# Patient Record
Sex: Male | Born: 1945 | Race: Black or African American | Hispanic: No | Smoking: Current every day smoker
Health system: Southern US, Community
[De-identification: ages and names within clinical notes are randomized; demographics above are authoritative.]

## PROBLEM LIST (undated history)

## (undated) DIAGNOSIS — E119 Type 2 diabetes mellitus without complications: Secondary | ICD-10-CM

## (undated) DIAGNOSIS — M1990 Unspecified osteoarthritis, unspecified site: Secondary | ICD-10-CM

## (undated) DIAGNOSIS — C801 Malignant (primary) neoplasm, unspecified: Secondary | ICD-10-CM

## (undated) DIAGNOSIS — I10 Essential (primary) hypertension: Secondary | ICD-10-CM

## (undated) DIAGNOSIS — J449 Chronic obstructive pulmonary disease, unspecified: Secondary | ICD-10-CM

## (undated) DIAGNOSIS — Z720 Tobacco use: Secondary | ICD-10-CM

## (undated) DIAGNOSIS — Z972 Presence of dental prosthetic device (complete) (partial): Secondary | ICD-10-CM

## (undated) DIAGNOSIS — R972 Elevated prostate specific antigen [PSA]: Secondary | ICD-10-CM

## (undated) DIAGNOSIS — G709 Myoneural disorder, unspecified: Secondary | ICD-10-CM

## (undated) HISTORY — PX: CERVICAL DISCECTOMY: SHX98

## (undated) HISTORY — PX: TUNNELED VENOUS PORT PLACEMENT: SHX819

## (undated) HISTORY — PX: CERVICAL FUSION: SHX112

## (undated) HISTORY — PX: LAMINECTOMY: SHX219

## (undated) HISTORY — PX: OTHER SURGICAL HISTORY: SHX169

## (undated) HISTORY — PX: COLOSTOMY REVISION: SHX5232

---

## 2005-10-29 ENCOUNTER — Emergency Department: Payer: Self-pay | Admitting: Emergency Medicine

## 2010-11-11 DIAGNOSIS — Z85038 Personal history of other malignant neoplasm of large intestine: Secondary | ICD-10-CM | POA: Insufficient documentation

## 2010-11-11 HISTORY — PX: COLON SURGERY: SHX602

## 2010-11-25 ENCOUNTER — Ambulatory Visit: Payer: Self-pay

## 2010-11-25 ENCOUNTER — Inpatient Hospital Stay: Payer: Self-pay | Admitting: Surgery

## 2010-12-03 LAB — PATHOLOGY REPORT

## 2011-01-10 ENCOUNTER — Encounter: Payer: Self-pay | Admitting: Nurse Practitioner

## 2011-01-10 ENCOUNTER — Encounter: Payer: Self-pay | Admitting: Cardiothoracic Surgery

## 2011-01-10 ENCOUNTER — Ambulatory Visit: Payer: Self-pay | Admitting: Internal Medicine

## 2011-01-11 ENCOUNTER — Encounter: Payer: Self-pay | Admitting: Nurse Practitioner

## 2011-01-11 ENCOUNTER — Encounter: Payer: Self-pay | Admitting: Cardiothoracic Surgery

## 2011-01-12 ENCOUNTER — Ambulatory Visit: Payer: Self-pay | Admitting: Internal Medicine

## 2011-01-12 LAB — CBC CANCER CENTER
Basophil #: 0 x10 3/mm (ref 0.0–0.1)
Eosinophil #: 0.2 x10 3/mm (ref 0.0–0.7)
Eosinophil: 1 %
Lymphocyte #: 2 x10 3/mm (ref 1.0–3.6)
Lymphocyte %: 33 %
MCHC: 29.8 g/dL — ABNORMAL LOW (ref 32.0–36.0)
Monocyte %: 10.2 %
Monocytes: 6 %
Neutrophil %: 53 %
Platelet: 315 x10 3/mm (ref 150–440)
RDW: 29.1 % — ABNORMAL HIGH (ref 11.5–14.5)
Segmented Neutrophils: 63 %

## 2011-01-12 LAB — COMPREHENSIVE METABOLIC PANEL
Albumin: 3.7 g/dL (ref 3.4–5.0)
Alkaline Phosphatase: 80 U/L (ref 50–136)
BUN: 17 mg/dL (ref 7–18)
Bilirubin,Total: 0.3 mg/dL (ref 0.2–1.0)
Calcium, Total: 9.3 mg/dL (ref 8.5–10.1)
Chloride: 106 mmol/L (ref 98–107)
Creatinine: 0.91 mg/dL (ref 0.60–1.30)
EGFR (African American): >60
EGFR (Non-African Amer.): >60
Glucose: 94 mg/dL (ref 65–99)
Osmolality: 286 (ref 275–301)
SGPT (ALT): 23 U/L
Sodium: 143 mmol/L (ref 136–145)

## 2011-01-17 ENCOUNTER — Ambulatory Visit: Payer: Self-pay | Admitting: Internal Medicine

## 2011-01-24 ENCOUNTER — Ambulatory Visit: Payer: Self-pay | Admitting: Surgery

## 2011-01-25 LAB — CBC CANCER CENTER
Basophil #: 0 x10 3/mm (ref 0.0–0.1)
Eosinophil #: 0.2 x10 3/mm (ref 0.0–0.7)
HCT: 31.2 % — ABNORMAL LOW (ref 40.0–52.0)
MCH: 21.7 pg — ABNORMAL LOW (ref 26.0–34.0)
MCHC: 30.1 g/dL — ABNORMAL LOW (ref 32.0–36.0)
Monocyte #: 0.6 x10 3/mm (ref 0.0–0.7)
Neutrophil %: 50.9 %
Platelet: 316 x10 3/mm (ref 150–440)
RDW: 30.3 % — ABNORMAL HIGH (ref 11.5–14.5)

## 2011-01-25 LAB — COMPREHENSIVE METABOLIC PANEL
Anion Gap: 9 (ref 7–16)
Calcium, Total: 8.6 mg/dL (ref 8.5–10.1)
Chloride: 106 mmol/L (ref 98–107)
Co2: 28 mmol/L (ref 21–32)
EGFR (African American): >60
EGFR (Non-African Amer.): >60
Osmolality: 285 (ref 275–301)
Potassium: 4.1 mmol/L (ref 3.5–5.1)
Sodium: 143 mmol/L (ref 136–145)

## 2011-02-11 ENCOUNTER — Ambulatory Visit: Payer: Self-pay | Admitting: Internal Medicine

## 2011-02-11 ENCOUNTER — Encounter: Payer: Self-pay | Admitting: Cardiothoracic Surgery

## 2011-02-11 ENCOUNTER — Encounter: Payer: Self-pay | Admitting: Nurse Practitioner

## 2011-02-11 LAB — BASIC METABOLIC PANEL
BUN: 19 mg/dL — ABNORMAL HIGH (ref 7–18)
Chloride: 105 mmol/L (ref 98–107)
Creatinine: 1.03 mg/dL (ref 0.60–1.30)
EGFR (African American): >60
EGFR (Non-African Amer.): >60
Glucose: 129 mg/dL — ABNORMAL HIGH (ref 65–99)
Potassium: 4 mmol/L (ref 3.5–5.1)
Sodium: 141 mmol/L (ref 136–145)

## 2011-02-11 LAB — CBC CANCER CENTER
Basophil #: 0 x10 3/mm (ref 0.0–0.1)
Basophil %: 0.4 %
Eosinophil #: 0.2 x10 3/mm (ref 0.0–0.7)
HCT: 37.3 % — ABNORMAL LOW (ref 40.0–52.0)
Lymphocyte #: 1.6 x10 3/mm (ref 1.0–3.6)
Lymphocyte %: 42.1 %
MCHC: 30.4 g/dL — ABNORMAL LOW (ref 32.0–36.0)
MCV: 77.4 fL — ABNORMAL LOW (ref 80–100)
Monocyte %: 14 %
Neutrophil #: 1.4 x10 3/mm (ref 1.4–6.5)
Neutrophil %: 38.8 %
Platelet: 200 x10 3/mm (ref 150–440)

## 2011-02-25 LAB — CBC CANCER CENTER
Basophil #: 0 x10 3/mm (ref 0.0–0.1)
Basophil %: 0.4 %
Eosinophil #: 0.1 x10 3/mm (ref 0.0–0.7)
Eosinophil %: 1.7 %
HCT: 41.8 % (ref 40.0–52.0)
HGB: 12.8 g/dL — ABNORMAL LOW (ref 13.0–18.0)
Lymphocyte %: 42.5 %
Lymphs Abs: 1.7 x10 3/mm (ref 1.0–3.6)
MCH: 23.9 pg — ABNORMAL LOW (ref 26.0–34.0)
MCHC: 30.5 g/dL — ABNORMAL LOW (ref 32.0–36.0)
MCV: 78.2 fL — ABNORMAL LOW (ref 80–100)
Monocyte #: 0.6 x10 3/mm (ref 0.0–0.7)
Monocyte %: 14.7 %
Neutrophil #: 1.6 x10 3/mm (ref 1.4–6.5)
Neutrophil %: 40.7 %
Platelet: 157 x10 3/mm (ref 150–440)
RBC: 5.35 x10 6/mm (ref 4.40–5.90)
RDW: 27.6 % — ABNORMAL HIGH (ref 11.5–14.5)
WBC: 4 x10 3/mm (ref 3.8–10.6)

## 2011-02-25 LAB — BASIC METABOLIC PANEL
Anion Gap: 6 — ABNORMAL LOW (ref 7–16)
BUN: 23 mg/dL — ABNORMAL HIGH (ref 7–18)
Calcium, Total: 8.8 mg/dL (ref 8.5–10.1)
Co2: 29 mmol/L (ref 21–32)
Creatinine: 1.17 mg/dL (ref 0.60–1.30)
EGFR (African American): >60
EGFR (Non-African Amer.): >60
Glucose: 93 mg/dL (ref 65–99)
Osmolality: 281 (ref 275–301)
Potassium: 4.2 mmol/L (ref 3.5–5.1)

## 2011-03-11 ENCOUNTER — Ambulatory Visit: Payer: Self-pay | Admitting: Internal Medicine

## 2011-03-11 LAB — CBC CANCER CENTER
Basophil %: 0.2 %
Eosinophil #: 0.1 x10 3/mm (ref 0.0–0.7)
HCT: 41.2 % (ref 40.0–52.0)
HGB: 13.1 g/dL (ref 13.0–18.0)
MCH: 24.9 pg — ABNORMAL LOW (ref 26.0–34.0)
MCHC: 31.8 g/dL — ABNORMAL LOW (ref 32.0–36.0)
Monocyte #: 0.6 x10 3/mm (ref 0.0–0.7)
Neutrophil #: 1.4 x10 3/mm (ref 1.4–6.5)
Neutrophil %: 39.8 %
Platelet: 165 x10 3/mm (ref 150–440)

## 2011-03-11 LAB — BASIC METABOLIC PANEL
Anion Gap: 10 (ref 7–16)
BUN: 21 mg/dL — ABNORMAL HIGH (ref 7–18)
Calcium, Total: 8.1 mg/dL — ABNORMAL LOW (ref 8.5–10.1)
EGFR (Non-African Amer.): >60
Glucose: 119 mg/dL — ABNORMAL HIGH (ref 65–99)
Osmolality: 282 (ref 275–301)
Potassium: 4.1 mmol/L (ref 3.5–5.1)

## 2011-03-25 LAB — BASIC METABOLIC PANEL
BUN: 19 mg/dL — ABNORMAL HIGH (ref 7–18)
Co2: 26 mmol/L (ref 21–32)
Creatinine: 1.19 mg/dL (ref 0.60–1.30)
EGFR (African American): >60
EGFR (Non-African Amer.): >60
Glucose: 116 mg/dL — ABNORMAL HIGH (ref 65–99)
Potassium: 4.3 mmol/L (ref 3.5–5.1)
Sodium: 137 mmol/L (ref 136–145)

## 2011-03-25 LAB — CBC CANCER CENTER
Basophil #: 0 x10 3/mm (ref 0.0–0.1)
Lymphocyte #: 1.7 x10 3/mm (ref 1.0–3.6)
MCV: 78.6 fL — ABNORMAL LOW (ref 80–100)
Monocyte %: 16.6 %
Platelet: 135 x10 3/mm — ABNORMAL LOW (ref 150–440)
RDW: 26.6 % — ABNORMAL HIGH (ref 11.5–14.5)
WBC: 3.7 x10 3/mm — ABNORMAL LOW (ref 3.8–10.6)

## 2011-04-11 ENCOUNTER — Ambulatory Visit: Payer: Self-pay | Admitting: Internal Medicine

## 2011-04-15 LAB — CBC CANCER CENTER
Basophil #: 0 x10 3/mm (ref 0.0–0.1)
Eosinophil %: 2 %
HGB: 14.4 g/dL (ref 13.0–18.0)
Lymphocyte #: 2 x10 3/mm (ref 1.0–3.6)
Lymphocyte %: 51.8 %
MCH: 26.1 pg (ref 26.0–34.0)
MCHC: 32.1 g/dL (ref 32.0–36.0)
MCV: 81.2 fL (ref 80–100)
Monocyte #: 0.7 x10 3/mm (ref 0.0–0.7)
Monocyte %: 17.5 %
Neutrophil #: 1.1 x10 3/mm — ABNORMAL LOW (ref 1.4–6.5)
Platelet: 168 x10 3/mm (ref 150–440)
RDW: 27.3 % — ABNORMAL HIGH (ref 11.5–14.5)
WBC: 3.9 x10 3/mm (ref 3.8–10.6)

## 2011-04-15 LAB — BASIC METABOLIC PANEL
Calcium, Total: 8.8 mg/dL (ref 8.5–10.1)
Co2: 26 mmol/L (ref 21–32)
Creatinine: 1.22 mg/dL (ref 0.60–1.30)
EGFR (African American): >60
EGFR (Non-African Amer.): >60
Osmolality: 277 (ref 275–301)
Sodium: 136 mmol/L (ref 136–145)

## 2011-04-29 LAB — BASIC METABOLIC PANEL
Anion Gap: 8 (ref 7–16)
BUN: 17 mg/dL (ref 7–18)
Calcium, Total: 8.7 mg/dL (ref 8.5–10.1)
EGFR (African American): >60
Glucose: 93 mg/dL (ref 65–99)
Osmolality: 264 (ref 275–301)
Potassium: 3.8 mmol/L (ref 3.5–5.1)

## 2011-04-29 LAB — CBC CANCER CENTER
Eosinophil #: 0.1 x10 3/mm (ref 0.0–0.7)
Eosinophil %: 1.6 %
HCT: 41.9 % (ref 40.0–52.0)
Lymphocyte #: 1.9 x10 3/mm (ref 1.0–3.6)
Lymphocyte %: 47.9 %
MCHC: 32.6 g/dL (ref 32.0–36.0)
MCV: 84 fL (ref 80–100)
Monocyte #: 0.8 x10 3/mm (ref 0.2–1.0)
Neutrophil #: 1.2 x10 3/mm — ABNORMAL LOW (ref 1.4–6.5)
Platelet: 140 x10 3/mm — ABNORMAL LOW (ref 150–440)
RDW: 26.5 % — ABNORMAL HIGH (ref 11.5–14.5)

## 2011-05-11 ENCOUNTER — Ambulatory Visit: Payer: Self-pay | Admitting: Internal Medicine

## 2011-05-13 LAB — CBC CANCER CENTER
Basophil #: 0.1 x10 3/mm (ref 0.0–0.1)
HGB: 14.6 g/dL (ref 13.0–18.0)
Lymphocyte %: 41.8 %
MCHC: 32.7 g/dL (ref 32.0–36.0)
MCV: 86 fL (ref 80–100)
Monocyte #: 1 x10 3/mm (ref 0.2–1.0)
Monocyte %: 23.1 %
Neutrophil #: 1.4 x10 3/mm (ref 1.4–6.5)
Neutrophil %: 32.5 %
Platelet: 101 x10 3/mm — ABNORMAL LOW (ref 150–440)
WBC: 4.4 x10 3/mm (ref 3.8–10.6)

## 2011-05-13 LAB — BASIC METABOLIC PANEL
Anion Gap: 9 (ref 7–16)
BUN: 20 mg/dL — ABNORMAL HIGH (ref 7–18)
Calcium, Total: 9 mg/dL (ref 8.5–10.1)
Creatinine: 1.39 mg/dL — ABNORMAL HIGH (ref 0.60–1.30)
EGFR (African American): >60
EGFR (Non-African Amer.): 53 — ABNORMAL LOW
Glucose: 113 mg/dL — ABNORMAL HIGH (ref 65–99)
Sodium: 136 mmol/L (ref 136–145)

## 2011-05-27 LAB — CBC CANCER CENTER
Basophil %: 1.2 %
Eosinophil #: 0.1 x10 3/mm (ref 0.0–0.7)
HGB: 14.5 g/dL (ref 13.0–18.0)
Lymphocyte #: 1.9 x10 3/mm (ref 1.0–3.6)
Lymphocyte %: 44.6 %
MCHC: 32.9 g/dL (ref 32.0–36.0)
Monocyte #: 0.9 x10 3/mm (ref 0.2–1.0)
Monocyte %: 20.7 %
Neutrophil #: 1.4 x10 3/mm (ref 1.4–6.5)
Neutrophil %: 32.2 %
Platelet: 106 x10 3/mm — ABNORMAL LOW (ref 150–440)
RBC: 5.01 10*6/uL (ref 4.40–5.90)
RDW: 24.1 % — ABNORMAL HIGH (ref 11.5–14.5)
WBC: 4.2 x10 3/mm (ref 3.8–10.6)

## 2011-05-27 LAB — COMPREHENSIVE METABOLIC PANEL
Albumin: 3.3 g/dL — ABNORMAL LOW (ref 3.4–5.0)
Alkaline Phosphatase: 100 U/L (ref 50–136)
Anion Gap: 10 (ref 7–16)
Chloride: 100 mmol/L (ref 98–107)
Creatinine: 1.17 mg/dL (ref 0.60–1.30)
EGFR (African American): >60
EGFR (Non-African Amer.): >60
Glucose: 130 mg/dL — ABNORMAL HIGH (ref 65–99)
Osmolality: 273 (ref 275–301)
Potassium: 3.8 mmol/L (ref 3.5–5.1)
SGOT(AST): 23 U/L (ref 15–37)
SGPT (ALT): 26 U/L
Sodium: 134 mmol/L — ABNORMAL LOW (ref 136–145)
Total Protein: 7.5 g/dL (ref 6.4–8.2)

## 2011-06-10 LAB — COMPREHENSIVE METABOLIC PANEL
Albumin: 3.3 g/dL — ABNORMAL LOW (ref 3.4–5.0)
Alkaline Phosphatase: 101 U/L (ref 50–136)
Anion Gap: 8 (ref 7–16)
BUN: 20 mg/dL — ABNORMAL HIGH (ref 7–18)
Bilirubin,Total: 0.4 mg/dL (ref 0.2–1.0)
Calcium, Total: 8.8 mg/dL (ref 8.5–10.1)
Chloride: 101 mmol/L (ref 98–107)
Creatinine: 1.13 mg/dL (ref 0.60–1.30)
Glucose: 121 mg/dL — ABNORMAL HIGH (ref 65–99)
Osmolality: 274 (ref 275–301)
Potassium: 3.8 mmol/L (ref 3.5–5.1)
SGOT(AST): 23 U/L (ref 15–37)
SGPT (ALT): 26 U/L
Total Protein: 7.6 g/dL (ref 6.4–8.2)

## 2011-06-10 LAB — CBC CANCER CENTER
Basophil #: 0 x10 3/mm (ref 0.0–0.1)
Basophil %: 0.4 %
Lymphocyte #: 1.8 x10 3/mm (ref 1.0–3.6)
Lymphocyte %: 45 %
MCH: 29.8 pg (ref 26.0–34.0)
MCV: 90 fL (ref 80–100)
Neutrophil %: 30.7 %
Platelet: 100 x10 3/mm — ABNORMAL LOW (ref 150–440)
RBC: 4.75 10*6/uL (ref 4.40–5.90)
WBC: 3.9 x10 3/mm (ref 3.8–10.6)

## 2011-06-11 ENCOUNTER — Ambulatory Visit: Payer: Self-pay | Admitting: Internal Medicine

## 2011-06-24 LAB — CBC CANCER CENTER
Basophil #: 0 x10 3/mm (ref 0.0–0.1)
Basophil %: 0.5 %
Eosinophil #: 0.1 x10 3/mm (ref 0.0–0.7)
Eosinophil %: 1.5 %
HGB: 13.9 g/dL (ref 13.0–18.0)
MCHC: 33.1 g/dL (ref 32.0–36.0)
MCV: 93 fL (ref 80–100)
Monocyte #: 0.7 x10 3/mm (ref 0.2–1.0)
Monocyte %: 18.8 %
RDW: 23.7 % — ABNORMAL HIGH (ref 11.5–14.5)
WBC: 3.4 x10 3/mm — ABNORMAL LOW (ref 3.8–10.6)

## 2011-06-24 LAB — COMPREHENSIVE METABOLIC PANEL
Albumin: 3.1 g/dL — ABNORMAL LOW (ref 3.4–5.0)
BUN: 22 mg/dL — ABNORMAL HIGH (ref 7–18)
Bilirubin,Total: 0.4 mg/dL (ref 0.2–1.0)
Calcium, Total: 8.8 mg/dL (ref 8.5–10.1)
Chloride: 100 mmol/L (ref 98–107)
Co2: 25 mmol/L (ref 21–32)
EGFR (African American): >60
EGFR (Non-African Amer.): >60
Glucose: 173 mg/dL — ABNORMAL HIGH (ref 65–99)
Osmolality: 278 (ref 275–301)
Total Protein: 7.5 g/dL (ref 6.4–8.2)

## 2011-07-01 LAB — CBC CANCER CENTER
Basophil %: 1.3 %
Eosinophil #: 0.1 x10 3/mm (ref 0.0–0.7)
HCT: 43 % (ref 40.0–52.0)
HGB: 14.5 g/dL (ref 13.0–18.0)
MCH: 31.6 pg (ref 26.0–34.0)
MCV: 94 fL (ref 80–100)
Monocyte %: 20.6 %
Neutrophil #: 1.1 x10 3/mm — ABNORMAL LOW (ref 1.4–6.5)
Neutrophil %: 28 %
Platelet: 159 x10 3/mm (ref 150–440)

## 2011-07-11 ENCOUNTER — Ambulatory Visit: Payer: Self-pay | Admitting: Internal Medicine

## 2011-07-15 LAB — CBC CANCER CENTER
Basophil #: 0.1 x10 3/mm (ref 0.0–0.1)
Eosinophil %: 2.8 %
HGB: 14.5 g/dL (ref 13.0–18.0)
Lymphocyte #: 1.9 x10 3/mm (ref 1.0–3.6)
Lymphocyte %: 46.8 %
MCH: 31.8 pg (ref 26.0–34.0)
Monocyte #: 0.7 x10 3/mm (ref 0.2–1.0)
Monocyte %: 17.1 %
Neutrophil %: 31.9 %
Platelet: 133 x10 3/mm — ABNORMAL LOW (ref 150–440)
RBC: 4.55 10*6/uL (ref 4.40–5.90)
RDW: 20.8 % — ABNORMAL HIGH (ref 11.5–14.5)
WBC: 4.2 x10 3/mm (ref 3.8–10.6)

## 2011-08-03 LAB — CREATININE, SERUM: Creatinine: 1.13 mg/dL (ref 0.60–1.30)

## 2011-08-05 LAB — CBC CANCER CENTER
Basophil #: 0.1 x10 3/mm (ref 0.0–0.1)
Basophil %: 1.9 %
Eosinophil #: 0.2 x10 3/mm (ref 0.0–0.7)
HCT: 46.1 % (ref 40.0–52.0)
HGB: 15.1 g/dL (ref 13.0–18.0)
Lymphocyte #: 1.8 x10 3/mm (ref 1.0–3.6)
Lymphocyte %: 44.8 %
MCH: 30.7 pg (ref 26.0–34.0)
MCHC: 32.7 g/dL (ref 32.0–36.0)
MCV: 94 fL (ref 80–100)
Monocyte #: 0.7 x10 3/mm (ref 0.2–1.0)
Neutrophil %: 32 %
RDW: 19.1 % — ABNORMAL HIGH (ref 11.5–14.5)

## 2011-08-11 ENCOUNTER — Ambulatory Visit: Payer: Self-pay | Admitting: Internal Medicine

## 2011-08-23 ENCOUNTER — Ambulatory Visit: Payer: Self-pay | Admitting: Oncology

## 2011-09-11 ENCOUNTER — Ambulatory Visit: Payer: Self-pay | Admitting: Oncology

## 2011-09-13 LAB — CBC CANCER CENTER
Basophil #: 0 x10 3/mm (ref 0.0–0.1)
Basophil %: 0.9 %
Eosinophil #: 0.1 x10 3/mm (ref 0.0–0.7)
Eosinophil %: 2.7 %
Lymphocyte #: 1.8 x10 3/mm (ref 1.0–3.6)
MCH: 29.5 pg (ref 26.0–34.0)
MCHC: 31.9 g/dL — ABNORMAL LOW (ref 32.0–36.0)
Monocyte #: 0.6 x10 3/mm (ref 0.2–1.0)
Neutrophil %: 44.3 %
Platelet: 173 x10 3/mm (ref 150–440)
RBC: 4.93 10*6/uL (ref 4.40–5.90)
RDW: 17.1 % — ABNORMAL HIGH (ref 11.5–14.5)

## 2011-09-13 LAB — COMPREHENSIVE METABOLIC PANEL
Alkaline Phosphatase: 103 U/L (ref 50–136)
Bilirubin,Total: 0.2 mg/dL (ref 0.2–1.0)
Calcium, Total: 8.8 mg/dL (ref 8.5–10.1)
Chloride: 104 mmol/L (ref 98–107)
Co2: 29 mmol/L (ref 21–32)
Creatinine: 1.53 mg/dL — ABNORMAL HIGH (ref 0.60–1.30)
EGFR (Non-African Amer.): 47 — ABNORMAL LOW
Osmolality: 278 (ref 275–301)
SGPT (ALT): 25 U/L (ref 12–78)
Sodium: 139 mmol/L (ref 136–145)

## 2011-09-19 LAB — CBC CANCER CENTER
Basophil #: 0 x10 3/mm (ref 0.0–0.1)
Eosinophil %: 3.1 %
HGB: 14.3 g/dL (ref 13.0–18.0)
MCH: 29.7 pg (ref 26.0–34.0)
Monocyte #: 0.5 x10 3/mm (ref 0.2–1.0)
Neutrophil %: 47.1 %
Platelet: 165 x10 3/mm (ref 150–440)
RBC: 4.83 10*6/uL (ref 4.40–5.90)
RDW: 17.6 % — ABNORMAL HIGH (ref 11.5–14.5)
WBC: 3.8 x10 3/mm (ref 3.8–10.6)

## 2011-09-26 LAB — CBC CANCER CENTER
Basophil #: 0 x10 3/mm (ref 0.0–0.1)
Eosinophil #: 0.1 x10 3/mm (ref 0.0–0.7)
Lymphocyte #: 0.7 x10 3/mm — ABNORMAL LOW (ref 1.0–3.6)
Lymphocyte %: 19.4 %
MCV: 93 fL (ref 80–100)
Monocyte %: 14.4 %
Neutrophil #: 2.1 x10 3/mm (ref 1.4–6.5)
Neutrophil %: 61.7 %
Platelet: 150 x10 3/mm (ref 150–440)
RDW: 17.4 % — ABNORMAL HIGH (ref 11.5–14.5)
WBC: 3.4 x10 3/mm — ABNORMAL LOW (ref 3.8–10.6)

## 2011-09-26 LAB — COMPREHENSIVE METABOLIC PANEL
Alkaline Phosphatase: 84 U/L (ref 50–136)
Anion Gap: 6 — ABNORMAL LOW (ref 7–16)
BUN: 12 mg/dL (ref 7–18)
Calcium, Total: 8.7 mg/dL (ref 8.5–10.1)
Creatinine: 1.18 mg/dL (ref 0.60–1.30)
EGFR (African American): >60
EGFR (Non-African Amer.): >60
Glucose: 92 mg/dL (ref 65–99)
Potassium: 3.3 mmol/L — ABNORMAL LOW (ref 3.5–5.1)
SGOT(AST): 19 U/L (ref 15–37)
SGPT (ALT): 23 U/L (ref 12–78)
Total Protein: 7.7 g/dL (ref 6.4–8.2)

## 2011-10-03 LAB — COMPREHENSIVE METABOLIC PANEL
Alkaline Phosphatase: 82 U/L (ref 50–136)
Anion Gap: 9 (ref 7–16)
Bilirubin,Total: 0.3 mg/dL (ref 0.2–1.0)
Calcium, Total: 8.7 mg/dL (ref 8.5–10.1)
Chloride: 103 mmol/L (ref 98–107)
Creatinine: 1.18 mg/dL (ref 0.60–1.30)
EGFR (African American): >60
EGFR (Non-African Amer.): >60
Glucose: 88 mg/dL (ref 65–99)
Osmolality: 277 (ref 275–301)
Potassium: 3.2 mmol/L — ABNORMAL LOW (ref 3.5–5.1)
Sodium: 139 mmol/L (ref 136–145)
Total Protein: 7.6 g/dL (ref 6.4–8.2)

## 2011-10-03 LAB — CBC CANCER CENTER
Basophil #: 0 x10 3/mm (ref 0.0–0.1)
Basophil %: 0.7 %
Eosinophil #: 0.2 x10 3/mm (ref 0.0–0.7)
Eosinophil %: 5.1 %
HCT: 44.8 % (ref 40.0–52.0)
Lymphocyte #: 0.9 x10 3/mm — ABNORMAL LOW (ref 1.0–3.6)
MCH: 29.9 pg (ref 26.0–34.0)
MCHC: 32.5 g/dL (ref 32.0–36.0)
MCV: 92 fL (ref 80–100)
Monocyte #: 0.4 x10 3/mm (ref 0.2–1.0)
Monocyte %: 12.1 %
Neutrophil #: 1.7 x10 3/mm (ref 1.4–6.5)
Platelet: 150 x10 3/mm (ref 150–440)
RBC: 4.87 10*6/uL (ref 4.40–5.90)
WBC: 3.2 x10 3/mm — ABNORMAL LOW (ref 3.8–10.6)

## 2011-10-10 LAB — CBC CANCER CENTER
HCT: 45.5 % (ref 40.0–52.0)
Lymphocyte #: 0.7 x10 3/mm — ABNORMAL LOW (ref 1.0–3.6)
Lymphocyte %: 19.5 %
MCH: 29.4 pg (ref 26.0–34.0)
Monocyte %: 13.1 %
Neutrophil #: 2.2 x10 3/mm (ref 1.4–6.5)
Neutrophil %: 61.4 %
Platelet: 162 x10 3/mm (ref 150–440)
RDW: 18 % — ABNORMAL HIGH (ref 11.5–14.5)
WBC: 3.6 x10 3/mm — ABNORMAL LOW (ref 3.8–10.6)

## 2011-10-10 LAB — COMPREHENSIVE METABOLIC PANEL
Albumin: 3.3 g/dL — ABNORMAL LOW (ref 3.4–5.0)
Anion Gap: 11 (ref 7–16)
Calcium, Total: 8.7 mg/dL (ref 8.5–10.1)
Chloride: 102 mmol/L (ref 98–107)
Co2: 28 mmol/L (ref 21–32)
EGFR (African American): >60
EGFR (Non-African Amer.): >60
SGOT(AST): 15 U/L (ref 15–37)
SGPT (ALT): 18 U/L (ref 12–78)
Sodium: 141 mmol/L (ref 136–145)

## 2011-10-11 ENCOUNTER — Ambulatory Visit: Payer: Self-pay | Admitting: Oncology

## 2011-11-04 LAB — COMPREHENSIVE METABOLIC PANEL
Alkaline Phosphatase: 92 U/L (ref 50–136)
BUN: 17 mg/dL (ref 7–18)
Bilirubin,Total: 0.2 mg/dL (ref 0.2–1.0)
Calcium, Total: 9.4 mg/dL (ref 8.5–10.1)
Co2: 26 mmol/L (ref 21–32)
Creatinine: 1.14 mg/dL (ref 0.60–1.30)
EGFR (African American): >60
EGFR (Non-African Amer.): >60
Osmolality: 271 (ref 275–301)
Potassium: 4 mmol/L (ref 3.5–5.1)
SGOT(AST): 17 U/L (ref 15–37)
SGPT (ALT): 22 U/L (ref 12–78)
Sodium: 135 mmol/L — ABNORMAL LOW (ref 136–145)

## 2011-11-04 LAB — CBC CANCER CENTER
Basophil #: 0.1 x10 3/mm (ref 0.0–0.1)
Basophil %: 1.5 %
Eosinophil #: 0.2 x10 3/mm (ref 0.0–0.7)
Eosinophil %: 4.4 %
HCT: 47.5 % (ref 40.0–52.0)
HGB: 15.7 g/dL (ref 13.0–18.0)
Lymphocyte #: 0.6 x10 3/mm — ABNORMAL LOW (ref 1.0–3.6)
Lymphocyte %: 17.3 %
MCV: 92 fL (ref 80–100)
Monocyte #: 0.5 x10 3/mm (ref 0.2–1.0)
Monocyte %: 13.6 %
Neutrophil #: 2.3 x10 3/mm (ref 1.4–6.5)
RDW: 17.8 % — ABNORMAL HIGH (ref 11.5–14.5)
WBC: 3.6 x10 3/mm — ABNORMAL LOW (ref 3.8–10.6)

## 2011-11-05 LAB — CEA: CEA: 2.5 ng/mL (ref 0.0–4.7)

## 2011-11-11 ENCOUNTER — Ambulatory Visit: Payer: Self-pay | Admitting: Internal Medicine

## 2012-02-10 ENCOUNTER — Ambulatory Visit: Payer: Self-pay | Admitting: Oncology

## 2012-02-10 LAB — CBC CANCER CENTER
Eosinophil %: 3.2 %
HCT: 47.4 % (ref 40.0–52.0)
HGB: 15.4 g/dL (ref 13.0–18.0)
Lymphocyte %: 24 %
MCH: 28.7 pg (ref 26.0–34.0)
MCV: 88 fL (ref 80–100)
Monocyte %: 10.3 %
Platelet: 170 x10 3/mm (ref 150–440)
RBC: 5.38 10*6/uL (ref 4.40–5.90)
WBC: 4.4 x10 3/mm (ref 3.8–10.6)

## 2012-02-11 ENCOUNTER — Ambulatory Visit: Payer: Self-pay | Admitting: Oncology

## 2012-03-08 ENCOUNTER — Ambulatory Visit: Payer: Self-pay | Admitting: Oncology

## 2012-03-23 ENCOUNTER — Ambulatory Visit: Payer: Self-pay | Admitting: Oncology

## 2012-04-10 ENCOUNTER — Ambulatory Visit: Payer: Self-pay | Admitting: Oncology

## 2012-04-10 HISTORY — PX: COLON SURGERY: SHX602

## 2012-04-18 ENCOUNTER — Ambulatory Visit: Payer: Self-pay | Admitting: Surgery

## 2012-04-24 ENCOUNTER — Ambulatory Visit: Payer: Self-pay | Admitting: Surgery

## 2012-04-24 LAB — CBC WITH DIFFERENTIAL/PLATELET
Basophil #: 0 10*3/uL (ref 0.0–0.1)
Basophil %: 0.3 %
HCT: 48.8 % (ref 40.0–52.0)
HGB: 16.1 g/dL (ref 13.0–18.0)
Lymphocyte #: 1.1 10*3/uL (ref 1.0–3.6)
Lymphocyte %: 21.5 %
MCHC: 33 g/dL (ref 32.0–36.0)
Neutrophil %: 66.1 %
Platelet: 171 10*3/uL (ref 150–440)
RBC: 5.63 10*6/uL (ref 4.40–5.90)
RDW: 17.3 % — ABNORMAL HIGH (ref 11.5–14.5)
WBC: 5 10*3/uL (ref 3.8–10.6)

## 2012-04-24 LAB — BASIC METABOLIC PANEL
BUN: 18 mg/dL (ref 7–18)
Creatinine: 0.88 mg/dL (ref 0.60–1.30)
EGFR (African American): >60
Osmolality: 279 (ref 275–301)
Sodium: 139 mmol/L (ref 136–145)

## 2012-05-01 ENCOUNTER — Inpatient Hospital Stay: Payer: Self-pay | Admitting: Surgery

## 2012-05-01 LAB — CBC WITH DIFFERENTIAL/PLATELET
Basophil %: 0.1 %
Eosinophil #: 0 10*3/uL (ref 0.0–0.7)
HGB: 15.4 g/dL (ref 13.0–18.0)
Lymphocyte #: 0.3 10*3/uL — ABNORMAL LOW (ref 1.0–3.6)
MCH: 28.6 pg (ref 26.0–34.0)
MCHC: 32.9 g/dL (ref 32.0–36.0)
Monocyte #: 0.7 x10 3/mm (ref 0.2–1.0)
Monocyte %: 6.7 %
Neutrophil #: 9.5 10*3/uL — ABNORMAL HIGH (ref 1.4–6.5)
Neutrophil %: 89.9 %
Platelet: 167 10*3/uL (ref 150–440)
RBC: 5.36 10*6/uL (ref 4.40–5.90)

## 2012-05-01 LAB — BASIC METABOLIC PANEL
Anion Gap: 9 (ref 7–16)
Chloride: 101 mmol/L (ref 98–107)
Co2: 24 mmol/L (ref 21–32)
EGFR (Non-African Amer.): >60
Osmolality: 276 (ref 275–301)
Sodium: 134 mmol/L — ABNORMAL LOW (ref 136–145)

## 2012-05-01 LAB — MAGNESIUM: Magnesium: 1.2 mg/dL — ABNORMAL LOW

## 2012-05-02 LAB — BASIC METABOLIC PANEL
Chloride: 101 mmol/L (ref 98–107)
Co2: 25 mmol/L (ref 21–32)
Creatinine: 1.11 mg/dL (ref 0.60–1.30)
EGFR (Non-African Amer.): >60
Glucose: 191 mg/dL — ABNORMAL HIGH (ref 65–99)
Sodium: 134 mmol/L — ABNORMAL LOW (ref 136–145)

## 2012-05-02 LAB — CBC WITH DIFFERENTIAL/PLATELET
Eosinophil #: 0 10*3/uL (ref 0.0–0.7)
Eosinophil %: 0 %
HGB: 13.9 g/dL (ref 13.0–18.0)
Lymphocyte #: 0.3 10*3/uL — ABNORMAL LOW (ref 1.0–3.6)
Lymphocyte %: 2.6 %
Neutrophil #: 10.5 10*3/uL — ABNORMAL HIGH (ref 1.4–6.5)
Neutrophil %: 89.9 %
Platelet: 150 10*3/uL (ref 150–440)
RBC: 4.91 10*6/uL (ref 4.40–5.90)
RDW: 16.7 % — ABNORMAL HIGH (ref 11.5–14.5)
WBC: 11.6 10*3/uL — ABNORMAL HIGH (ref 3.8–10.6)

## 2012-05-02 LAB — PATHOLOGY REPORT

## 2012-05-02 LAB — MAGNESIUM: Magnesium: 2.4 mg/dL

## 2012-05-04 LAB — PROTIME-INR
INR: 1.1
Prothrombin Time: 13.9 secs (ref 11.5–14.7)

## 2012-05-04 LAB — CBC WITH DIFFERENTIAL/PLATELET
Basophil #: 0 10*3/uL (ref 0.0–0.1)
Basophil %: 0.2 %
Eosinophil #: 0 10*3/uL (ref 0.0–0.7)
Eosinophil %: 0.1 %
HCT: 38.6 % — ABNORMAL LOW (ref 40.0–52.0)
HGB: 12.7 g/dL — ABNORMAL LOW (ref 13.0–18.0)
Lymphocyte %: 2.7 %
MCHC: 32.9 g/dL (ref 32.0–36.0)
Monocyte #: 0.9 x10 3/mm (ref 0.2–1.0)
Neutrophil #: 8.9 10*3/uL — ABNORMAL HIGH (ref 1.4–6.5)
Platelet: 159 10*3/uL (ref 150–440)
WBC: 10.2 10*3/uL (ref 3.8–10.6)

## 2012-05-04 LAB — BASIC METABOLIC PANEL
BUN: 12 mg/dL (ref 7–18)
Chloride: 105 mmol/L (ref 98–107)
Creatinine: 0.76 mg/dL (ref 0.60–1.30)
EGFR (Non-African Amer.): >60
Glucose: 136 mg/dL — ABNORMAL HIGH (ref 65–99)

## 2012-05-05 LAB — CBC WITH DIFFERENTIAL/PLATELET
Basophil #: 0 10*3/uL (ref 0.0–0.1)
Basophil %: 0 %
Eosinophil #: 0.1 10*3/uL (ref 0.0–0.7)
HCT: 37.2 % — ABNORMAL LOW (ref 40.0–52.0)
Lymphocyte #: 0.7 10*3/uL — ABNORMAL LOW (ref 1.0–3.6)
MCH: 28.7 pg (ref 26.0–34.0)
MCHC: 33.2 g/dL (ref 32.0–36.0)
MCV: 87 fL (ref 80–100)
Monocyte #: 0.8 x10 3/mm (ref 0.2–1.0)
Monocyte %: 9.7 %
Neutrophil #: 7 10*3/uL — ABNORMAL HIGH (ref 1.4–6.5)
Neutrophil %: 81.3 %
Platelet: 159 10*3/uL (ref 150–440)
RBC: 4.29 10*6/uL — ABNORMAL LOW (ref 4.40–5.90)
RDW: 17.2 % — ABNORMAL HIGH (ref 11.5–14.5)

## 2012-05-05 LAB — BASIC METABOLIC PANEL
BUN: 13 mg/dL (ref 7–18)
Chloride: 101 mmol/L (ref 98–107)
Co2: 27 mmol/L (ref 21–32)
Creatinine: 0.75 mg/dL (ref 0.60–1.30)
Glucose: 119 mg/dL — ABNORMAL HIGH (ref 65–99)
Osmolality: 269 (ref 275–301)
Potassium: 4.2 mmol/L (ref 3.5–5.1)

## 2012-05-10 ENCOUNTER — Ambulatory Visit: Payer: Self-pay | Admitting: Oncology

## 2012-08-17 ENCOUNTER — Ambulatory Visit: Payer: Self-pay | Admitting: Oncology

## 2012-10-12 ENCOUNTER — Ambulatory Visit: Payer: Self-pay | Admitting: Oncology

## 2012-10-12 LAB — COMPREHENSIVE METABOLIC PANEL
Albumin: 3.5 g/dL (ref 3.4–5.0)
Anion Gap: 7 (ref 7–16)
BUN: 18 mg/dL (ref 7–18)
Calcium, Total: 8.7 mg/dL (ref 8.5–10.1)
Chloride: 103 mmol/L (ref 98–107)
Co2: 28 mmol/L (ref 21–32)
Creatinine: 1.26 mg/dL (ref 0.60–1.30)
EGFR (Non-African Amer.): 59 — ABNORMAL LOW
Osmolality: 277 (ref 275–301)
Potassium: 3.5 mmol/L (ref 3.5–5.1)
SGPT (ALT): 23 U/L (ref 12–78)
Total Protein: 7.5 g/dL (ref 6.4–8.2)

## 2012-10-12 LAB — CBC CANCER CENTER
Eosinophil %: 2.1 %
HCT: 48.7 % (ref 40.0–52.0)
HGB: 15.6 g/dL (ref 13.0–18.0)
Lymphocyte #: 1 x10 3/mm (ref 1.0–3.6)
Lymphocyte %: 26.6 %
MCV: 84 fL (ref 80–100)
Monocyte #: 0.4 x10 3/mm (ref 0.2–1.0)
Neutrophil %: 59 %
RBC: 5.82 10*6/uL (ref 4.40–5.90)
RDW: 17.7 % — ABNORMAL HIGH (ref 11.5–14.5)
WBC: 3.9 x10 3/mm (ref 3.8–10.6)

## 2012-10-15 LAB — CEA: CEA: 2.4 ng/mL (ref 0.0–4.7)

## 2012-11-10 ENCOUNTER — Ambulatory Visit: Payer: Self-pay | Admitting: Oncology

## 2013-01-15 ENCOUNTER — Ambulatory Visit: Payer: Self-pay | Admitting: Oncology

## 2013-01-18 ENCOUNTER — Ambulatory Visit: Payer: Self-pay | Admitting: Oncology

## 2013-01-18 LAB — COMPREHENSIVE METABOLIC PANEL
Albumin: 3.9 g/dL (ref 3.4–5.0)
Alkaline Phosphatase: 183 U/L — ABNORMAL HIGH
Anion Gap: 9 (ref 7–16)
BUN: 19 mg/dL — ABNORMAL HIGH (ref 7–18)
Bilirubin,Total: 0.3 mg/dL (ref 0.2–1.0)
CO2: 28 mmol/L (ref 21–32)
Calcium, Total: 9.2 mg/dL (ref 8.5–10.1)
Chloride: 103 mmol/L (ref 98–107)
Creatinine: 1.2 mg/dL (ref 0.60–1.30)
EGFR (Non-African Amer.): >60
Glucose: 102 mg/dL — ABNORMAL HIGH (ref 65–99)
OSMOLALITY: 282 (ref 275–301)
POTASSIUM: 3.9 mmol/L (ref 3.5–5.1)
SGOT(AST): 14 U/L — ABNORMAL LOW (ref 15–37)
SGPT (ALT): 29 U/L (ref 12–78)
SODIUM: 140 mmol/L (ref 136–145)
Total Protein: 7.8 g/dL (ref 6.4–8.2)

## 2013-01-18 LAB — CBC CANCER CENTER
Basophil #: 0 x10 3/mm (ref 0.0–0.1)
Basophil %: 0.6 %
EOS ABS: 0.1 x10 3/mm (ref 0.0–0.7)
EOS PCT: 1.8 %
HCT: 54.2 % — ABNORMAL HIGH (ref 40.0–52.0)
HGB: 17.7 g/dL (ref 13.0–18.0)
Lymphocyte #: 1.2 x10 3/mm (ref 1.0–3.6)
Lymphocyte %: 28.2 %
MCH: 28.9 pg (ref 26.0–34.0)
MCHC: 32.6 g/dL (ref 32.0–36.0)
MCV: 89 fL (ref 80–100)
Monocyte #: 0.5 x10 3/mm (ref 0.2–1.0)
Monocyte %: 12.3 %
NEUTROS PCT: 57.1 %
Neutrophil #: 2.5 x10 3/mm (ref 1.4–6.5)
Platelet: 181 x10 3/mm (ref 150–440)
RBC: 6.12 10*6/uL — ABNORMAL HIGH (ref 4.40–5.90)
RDW: 16.7 % — ABNORMAL HIGH (ref 11.5–14.5)
WBC: 4.4 x10 3/mm (ref 3.8–10.6)

## 2013-01-21 LAB — CEA: CEA: 3.1 ng/mL (ref 0.0–4.7)

## 2013-02-10 ENCOUNTER — Ambulatory Visit: Payer: Self-pay | Admitting: Oncology

## 2013-06-21 ENCOUNTER — Ambulatory Visit: Payer: Self-pay | Admitting: Oncology

## 2013-06-21 LAB — COMPREHENSIVE METABOLIC PANEL
Albumin: 3.5 g/dL (ref 3.4–5.0)
Alkaline Phosphatase: 125 U/L — ABNORMAL HIGH
Anion Gap: 7 (ref 7–16)
BILIRUBIN TOTAL: 0.3 mg/dL (ref 0.2–1.0)
BUN: 20 mg/dL — ABNORMAL HIGH (ref 7–18)
CO2: 30 mmol/L (ref 21–32)
Calcium, Total: 8.9 mg/dL (ref 8.5–10.1)
Chloride: 101 mmol/L (ref 98–107)
Creatinine: 1.47 mg/dL — ABNORMAL HIGH (ref 0.60–1.30)
GFR CALC AF AMER: 56 — AB
GFR CALC NON AF AMER: 49 — AB
Glucose: 111 mg/dL — ABNORMAL HIGH (ref 65–99)
Osmolality: 279 (ref 275–301)
Potassium: 3.4 mmol/L — ABNORMAL LOW (ref 3.5–5.1)
SGOT(AST): 16 U/L (ref 15–37)
SGPT (ALT): 26 U/L (ref 12–78)
Sodium: 138 mmol/L (ref 136–145)
TOTAL PROTEIN: 7.4 g/dL (ref 6.4–8.2)

## 2013-06-21 LAB — CBC CANCER CENTER
BASOS ABS: 0 x10 3/mm (ref 0.0–0.1)
Basophil %: 0.6 %
EOS ABS: 0.2 x10 3/mm (ref 0.0–0.7)
Eosinophil %: 3.2 %
HCT: 51.3 % (ref 40.0–52.0)
HGB: 16.9 g/dL (ref 13.0–18.0)
LYMPHS ABS: 1.4 x10 3/mm (ref 1.0–3.6)
LYMPHS PCT: 27.6 %
MCH: 29 pg (ref 26.0–34.0)
MCHC: 33 g/dL (ref 32.0–36.0)
MCV: 88 fL (ref 80–100)
Monocyte #: 0.5 x10 3/mm (ref 0.2–1.0)
Monocyte %: 9.2 %
NEUTROS PCT: 59.4 %
Neutrophil #: 3.1 x10 3/mm (ref 1.4–6.5)
Platelet: 179 x10 3/mm (ref 150–440)
RBC: 5.84 10*6/uL (ref 4.40–5.90)
RDW: 16 % — ABNORMAL HIGH (ref 11.5–14.5)
WBC: 5.1 x10 3/mm (ref 3.8–10.6)

## 2013-06-24 LAB — CEA: CEA: 3.1 ng/mL (ref 0.0–4.7)

## 2013-07-10 ENCOUNTER — Ambulatory Visit: Payer: Self-pay | Admitting: Oncology

## 2013-12-20 ENCOUNTER — Ambulatory Visit: Payer: Self-pay | Admitting: Oncology

## 2013-12-20 LAB — COMPREHENSIVE METABOLIC PANEL
ALK PHOS: 106 U/L
Albumin: 3.5 g/dL (ref 3.4–5.0)
Anion Gap: 7 (ref 7–16)
BUN: 23 mg/dL — ABNORMAL HIGH (ref 7–18)
Bilirubin,Total: 0.4 mg/dL (ref 0.2–1.0)
CALCIUM: 9.2 mg/dL (ref 8.5–10.1)
CREATININE: 1.48 mg/dL — AB (ref 0.60–1.30)
Chloride: 101 mmol/L (ref 98–107)
Co2: 30 mmol/L (ref 21–32)
GFR CALC NON AF AMER: 50 — AB
GLUCOSE: 109 mg/dL — AB (ref 65–99)
Osmolality: 280 (ref 275–301)
Potassium: 3.4 mmol/L — ABNORMAL LOW (ref 3.5–5.1)
SGOT(AST): 16 U/L (ref 15–37)
SGPT (ALT): 24 U/L
Sodium: 138 mmol/L (ref 136–145)
TOTAL PROTEIN: 7.6 g/dL (ref 6.4–8.2)

## 2013-12-20 LAB — CBC CANCER CENTER
Basophil #: 0 x10 3/mm (ref 0.0–0.1)
Basophil %: 1 %
Eosinophil #: 0.1 x10 3/mm (ref 0.0–0.7)
Eosinophil %: 2.1 %
HCT: 52.9 % — ABNORMAL HIGH (ref 40.0–52.0)
HGB: 17.4 g/dL (ref 13.0–18.0)
Lymphocyte #: 1.6 x10 3/mm (ref 1.0–3.6)
Lymphocyte %: 33.7 %
MCH: 28.9 pg (ref 26.0–34.0)
MCHC: 32.9 g/dL (ref 32.0–36.0)
MCV: 88 fL (ref 80–100)
Monocyte #: 0.5 x10 3/mm (ref 0.2–1.0)
Monocyte %: 9.4 %
Neutrophil #: 2.6 x10 3/mm (ref 1.4–6.5)
Neutrophil %: 53.8 %
Platelet: 162 x10 3/mm (ref 150–440)
RBC: 6.02 10*6/uL — ABNORMAL HIGH (ref 4.40–5.90)
RDW: 16.2 % — ABNORMAL HIGH (ref 11.5–14.5)
WBC: 4.8 x10 3/mm (ref 3.8–10.6)

## 2013-12-23 LAB — CEA: CEA: 3 ng/mL (ref 0.0–4.7)

## 2014-01-10 ENCOUNTER — Ambulatory Visit: Payer: Self-pay | Admitting: Oncology

## 2014-02-10 ENCOUNTER — Ambulatory Visit: Payer: Self-pay | Admitting: Oncology

## 2014-03-14 ENCOUNTER — Ambulatory Visit
Admit: 2014-03-14 | Disposition: A | Discharge status: Home / Self Care | Payer: Self-pay | Attending: Oncology | Admitting: Oncology

## 2014-04-11 ENCOUNTER — Ambulatory Visit
Admit: 2014-04-11 | Disposition: A | Discharge status: Home / Self Care | Payer: Self-pay | Attending: Oncology | Admitting: Oncology

## 2014-04-23 LAB — COMPREHENSIVE METABOLIC PANEL
ALK PHOS: 101 U/L
ANION GAP: 8 (ref 7–16)
Albumin: 4.1 g/dL
BUN: 22 mg/dL — ABNORMAL HIGH
Bilirubin,Total: 0.4 mg/dL
CO2: 31 mmol/L
Calcium, Total: 9.3 mg/dL
Chloride: 99 mmol/L — ABNORMAL LOW
Creatinine: 1.39 mg/dL — ABNORMAL HIGH
EGFR (Non-African Amer.): 52 — ABNORMAL LOW
GFR CALC AF AMER: 60 — AB
Glucose: 117 mg/dL — ABNORMAL HIGH
Potassium: 4.7 mmol/L
SGOT(AST): 20 U/L
SGPT (ALT): 19 U/L
SODIUM: 138 mmol/L
Total Protein: 7.4 g/dL

## 2014-04-23 LAB — CBC CANCER CENTER
BASOS PCT: 1.2 %
Basophil #: 0 x10 3/mm (ref 0.0–0.1)
EOS PCT: 2.7 %
Eosinophil #: 0.1 x10 3/mm (ref 0.0–0.7)
HCT: 54.1 % — ABNORMAL HIGH (ref 40.0–52.0)
HGB: 17.6 g/dL (ref 13.0–18.0)
LYMPHS ABS: 1.5 x10 3/mm (ref 1.0–3.6)
Lymphocyte %: 37.5 %
MCH: 28.6 pg (ref 26.0–34.0)
MCHC: 32.6 g/dL (ref 32.0–36.0)
MCV: 88 fL (ref 80–100)
MONOS PCT: 9.5 %
Monocyte #: 0.4 x10 3/mm (ref 0.2–1.0)
Neutrophil #: 2 x10 3/mm (ref 1.4–6.5)
Neutrophil %: 49.1 %
PLATELETS: 160 x10 3/mm (ref 150–440)
RBC: 6.17 10*6/uL — AB (ref 4.40–5.90)
RDW: 15.9 % — ABNORMAL HIGH (ref 11.5–14.5)
WBC: 4 x10 3/mm (ref 3.8–10.6)

## 2014-04-24 LAB — CEA: CEA: 3.3 ng/mL (ref 0.0–4.7)

## 2014-04-29 NOTE — Consult Note (Signed)
Reason for Visit: This 69 year old Male patient presents to the clinic for initial evaluation of  Colon cancer .   Referred by Dr. Oliva Bustard.  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 He presented with partial bowel obstruction on 11/27/10 and colonoscopy showed a partially obstructing mass in the descending colon. Biopsy was positive for moderately differentiated adenocarcinoma mucinous type. He was seen and underwent exploratory laparotomy with sigmoid resection an end colostomy. Pathology showed a 9.2 cm mucinous adenocarcinoma moderately well-differentiated. Soft tissue margins were positive. Tumor directly and dated the retroperitoneum as well as a lateral abdominal wall. Lymph vascular invasion was present. He was seen actually by medical oncology and has undergone 12 cycles of FOLFOX chemotherapy. He is doing well the present time. He is having no dominant pain. Colostomy is functioning well. He is scheduled for a repeat PET/CT scan next week.I been asked to evaluate the patient for possibility local regional radiation.  Past Hx:    sacral decubitus ulcer:    Anemia:    Cancer, Colon:    Hypertension:    Colostomy:    Colectomy:   Past, Family and Social History:   Past Medical History positive    Cardiovascular hypertension    Immunologic Chronic anemia    Past Surgical History Colectomy with diverting colostomy    Past Medical History Comments Sacral  decubitus ulcer    Family History noncontributory    Social History noncontributory    Additional Past Medical and Surgical History Patient seen by himself, patient is a truck driver   Allergies:   No Known Allergies:   Home Meds:  Home Medications: Medication Instructions Status  hydrochlorothiazide 12.5 mg oral capsule 1 cap(s) orally once a day Active  Diovan 160 mg oral tablet 1 tab(s) orally once a day Active   Review of Systems:   General negative    Performance Status (ECOG) 0    Skin negative    Breast negative    Ophthalmologic negative    ENMT negative    Respiratory and Thorax negative    Cardiovascular negative    Gastrointestinal see HPI    Genitourinary negative    Musculoskeletal negative    Neurological negative    Psychiatric negative    Hematology/Lymphatics negative    Endocrine negative    Allergic/Immunologic negative   Physical Exam:  General/Skin/HEENT:   General normal    Skin normal    Eyes normal    ENMT normal    Head and Neck normal    Additional PE Well-developed well-nourished male in NAD. Abdomen is benign with an no organomegaly or masses noted. He has a diverting colostomy which is functioning well. Midline incision is well-healed. No mass or nodularity is detected in the abdominal wall. Lungs are clear to A&P cardiac examination shows regular rate and rhythm.   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 Would plan on delivering 4500 cGy to this area along with radiation sensitizing 5-FU chemotherapy under Dr. Metro Kung direction. I discussed the risks and benefits of treatment with the patient. I have discussed the case personally with Dr. Oliva Bustard who is in agreement with my treatment plan. I have set him up for CT simulation in about a week's time after review of his most recent PET/CT which will be performed next week. Risks and benefits of treatment including increased diarrhea possible nausea possible skin reaction and possibility of alteration of his blood counts were all explained in detail to the patient.  I would like to take this opportunity to thank you for allowing me to continue to participate in this patient's care.  CC Referral:   cc: Dr. Andrey Farmer   Electronic Signatures: Baruch Gouty Roda Shutters (MD)  (Signed 08-Aug-13 15:56)  Authored: HPI, Diagnosis, Past Hx, PFSH, Allergies, Home Meds, ROS, Physical Exam, Encounter Assessment and Plan, CC Referring Physician   Last Updated: 08-Aug-13 15:56 by Armstead Peaks (MD)

## 2014-05-02 NOTE — Op Note (Signed)
PATIENT NAME:  Cameron Howe, Cameron Howe MR#:  [id number] DATE OF BIRTH:  [birth year]  DATE OF PROCEDURE:  05/01/2012  PREOPERATIVE DIAGNOSIS: Colon cancer.  POSTOPERATIVE DIAGNOSIS: Colon cancer.  PROCEDURES:  Exam under anesthesia, cystoscopy, bilateral retrograde pyelograms, bilateral ureteral stent placement, Foley catheter placement.   SURGEON: Georgette Dover, MD  TYPE OF ANESTHESIA:  General.  FINDINGS:  Exam under anesthesia - the penis was circumcised and normal without mass or lesion.  The testicles were descended bilaterally and were without mass.  On digital rectal exam and bimanual exam, the lower abdomen and bladder were palpably normal without mass.  The prostate was mildly enlarged, but smooth without hard area or nodule.    On cystoscopy, the urethra appeared normal, the prostatic urethra showed trilobar hypertrophy and visual obstruction.  The bladder neck was elevated. The trigone and ureteral orifices were in their normal orthotopic position, with good clear efflux. The bladder was examined in its entirety and noted to have normal mucosa without mass or lesion. There was no foreign body, stitch or stone in the bladder.   Left retrograde pyelogram:  The ureter was slightly J-hooked, the ureter was normal without dilation or stricture or filling defect, there was a single collecting system with a normal collecting system in renal pelvis without filling defect or dilation.   Right retrograde pyelogram:  The right ureter was slightly J-hooked. The ureter was normal without stricture or dilation or filling defect, the collecting system was a single unit and was without filling defect, dilation or narrowing.   On both sides, efflux of contrast was brisk and normal.    DESCRIPTION OF PROCEDURE:  After consent was obtained, the patient was brought to the operating room.  After adequate anesthesia, he was placed in lithotomy position and examined under anesthesia.  He was then prepped and  draped in the usual sterile fashion. A time out was performed to confirm the patient and procedure. A rigid cystoscope was passed per urethra and the bladder examined.  An open-ended catheter was used to obtain a left retrograde pyelogram with retrograde injection of contrast and then a right retrograde pyelogram with right retrograde injection of contrast. The left ureter was then cannulated with an angled sensor wire. The wire was advanced into the collecting system and the open-ended catheter advanced into the collecting system.  The wire was removed and the catheter left in the renal pelvis.  The wire was then repassed under fluoroscopic guidance up the right ureter into the right collecting system.  A second stent was then advanced over the wire and the wire removed.  The right ureteral stent was left in the right renal pelvis.  The stents were externalized and straight stents.  The scope was then broken and the catheters removed, then the scope sheath removed from the bladder.  Securing the catheters, a 16-French Foley was placed without difficulty. The balloon was inflated and seated at the bladder neck draining clear urine.   Final fluoroscopic image showed the stents to be in good position.    The adapter was connected to the Foley catheter and the ureteral stents and then to gravity drainage.   BLOOD LOSS:  Minimal.  SPECIMENS:  None.  COMPLICATIONS:  None.   DRAINS:   1.  6-French bilateral open-ended externalized ureteral catheters. 2.  16-French Foley catheter.  PATIENT DISPOSITION:  The patient was turned over to Dr. Marina Gravel for his procedure.  ____________________________ Georgette Dover, MD mre:sb D: 05/01/2012 08:38:18 ET T:  05/01/2012 08:47:22 ET JOB#: [id number]  cc: Georgette Dover, MD, <Dictator> Georgette Dover MD ELECTRONICALLY SIGNED 07/03/2012 18:24

## 2014-05-02 NOTE — Discharge Summary (Signed)
PATIENT NAME:  Cameron Howe, Cameron Howe MR#:  [id number] DATE OF BIRTH:  15-Nov-1945  DATE OF ADMISSION:  05/01/2012 DATE OF DISCHARGE:  05/08/2012  FINAL DIAGNOSIS: Colon cancer with desire for colostomy reversal.   PRINCIPAL PROCEDURES:  1. Colostomy reversal, partial colectomy, low stapled colopelvic anastomosis on 05/01/2012.  2.  Cystoscopy with bilateral stent placement by urology on the same day.  2. Internal medicine consultation. [facility]. 3. Pulmonary critical care consultation Dr. Mortimer Fries.   HOSPITAL COURSE SUMMARY: The patient was taken to the operating room on 05/01/2012, at which point a colostomy was reversed as described above. Postoperatively, the patient had respiratory insufficiency requiring brief reintubation in the PACU and then a brief intensive care unit stay. He was seen by Dr. Mortimer Fries for this. On postoperative day #2, the patient was able to be transferred to the floor. On postoperative day #3, the patient continued to improve. He had a small amount of rectal bleeding thought secondary to either hemorrhoids or the staple line which subsided. On postoperative day #4, the patient continued to improve. There were no further signs of bleeding; however, the patient did require 6 liters of oxygen up till this point in the post op recovery time.  By the 27th, the patient needed a Foley catheter for urinary retention. He was passing gas. His diet was able to be started. On the 28th, the patient continued to improve. His Foley catheter had been removed. He had 4 bowel movements. No difficulties with voiding. His abdomen was soft and nontender. His incisions were clean. He was stable and improved for discharge on the 29th.   DISCHARGE MEDICATIONS: Diovan 160 mg by mouth once a day in the morning, hydrochlorothiazide 12.5 mg by mouth once in the morning, acetaminophen/hydrocodone 5/325 one tab every 4 hours as needed for pain.   He will follow up in our office in 3 to 5 days for staple  removal and further check-up. Call with any questions or concerns, bright red bleeding from his incision or rectum, nausea, vomiting or fever.   ____________________________ [name] Marina Gravel, MD mab:gb D: 05/08/2012 21:48:29 ET T: 05/08/2012 22:52:17 ET JOB#: [id number]  cc: Elta Guadeloupe A. Marina Gravel, MD, <Dictator> Martie Lee. Oliva Bustard, MD Hortencia Conradi MD ELECTRONICALLY SIGNED 05/09/2012 20:38

## 2014-05-02 NOTE — Consult Note (Signed)
PATIENT NAME:  Cameron Howe, Cameron Howe MR#:  [id number] DATE OF BIRTH:  [birth year]  DATE OF CONSULTATION:  05/01/2012  REFERRING PHYSICIAN:  Elta Guadeloupe A. Marina Gravel, MD CONSULTING PHYSICIAN:  Demetrios Loll, MD  PRIMARY CARE PHYSICIAN: Dianah Field. Mable Fill, MD   REASON FOR CONSULTATION: Respiratory distress.   HISTORY OF PRESENT ILLNESS: The patient is a 69 year old African American male with a history of hypertension, COPD, colon cancer status post surgery, anemia, tobacco abuse, came to OR for colostomy reversal today. The patient underwent colostomy reversal by Dr. Marina Gravel today, but the patient developed respiratory distress after surgery with anesthesia. The patient was treated with Narcan and Solu-Medrol nebulizer in PACU. In addition, since the patient's BP was high, the patient was treated with IV Lopressor and labetalol. Hospital physician was requested for consult. The patient now is on Ventimask. Denies any fever or chills but has mild cough. No wheezing. The patient denies any other problem or symptoms.   PAST MEDICAL HISTORY: As mentioned above, hypertension, COPD, colon cancer, anemia, tobacco abuse.   PAST SURGICAL HISTORY: Colectomy with colostomy.   FAMILY HISTORY: Has a history of breast cancer, otherwise healthy.   SOCIAL HISTORY: Smokes 1/2 pack a day recently, but the patient smoked 1 pack a day for 40 to 45 years. Occasional alcohol. Denies any drug abuse.   ALLERGIES: No.  HOME MEDICATIONS:  1.  Tylenol 500 mg p.o. 2 tablets once a day p.r.n.  2.  HCTZ 12.5 mg p.o. once a day. 3.  Diovan 160 mg p.o. daily.   REVIEW OF SYSTEMS:    CONSTITUTIONAL: The patient denies any fever or chills. No headache or dizziness. No weakness.  EYES: No double vision, blurry vision.  EARS, NOSE, THROAT: No postnasal drip, slurred speech or dysphagia.  CARDIOVASCULAR: No chest pain, palpitations, orthopnea or nocturnal dyspnea. No leg edema.  PULMONARY: Positive for cough, sputum and mild wheezing and shortness of  breath and respiratory distress.  GASTROINTESTINAL: No abdominal pain, nausea, vomiting or diarrhea. No melena or bloody stool.  GENITOURINARY: No dysuria, hematuria or incontinence.  SKIN: No rash or jaundice.  NEUROLOGIC: No syncope, loss of consciousness or seizure.  HEMATOLOGIC: No easy bleeding or bleeding.   PHYSICAL EXAMINATION:  VITAL SIGNS: Temperature 97.5, blood pressure 111/59, pulse 62, O2 saturation 91% on Ventimask. The patient's O2 saturation was 50 after surgery. Respirations 45.  GENERAL: The patient is alert, awake, oriented, in no acute distress on Ventimask.  HEENT: Pupils round, equal and reactive to light and accommodation. Moist oral mucosa. Clear oropharynx.  NECK: Supple. No JVD or carotid bruits. No lymphadenopathy. No thyromegaly.  CARDIOVASCULAR: S1, S2, regular rate, rhythm. No murmurs or gallops.  PULMONARY: Bilateral air entry. No obvious wheezing or rales. No use of accessory muscles to breathe.  ABDOMEN: Soft. No distention. No tenderness. No organomegaly. Bowel sounds present.  EXTREMITIES: No edema, clubbing or cyanosis. No calf tenderness. Bilateral pedal pulses present.  SKIN: No rash or jaundice.  NEUROLOGIC: A and O x 3. No focal deficit. Power 5/5. Sensory intact.  LABORATORY, DIAGNOSTIC AND RADIOLOGICAL DATA: CBC normal. Glucose 238, BUN 12, creatinine 0.99, potassium 3.3, chloride 101, magnesium 1.2. Last ABG showed pH of 7.34, pCO2 of 45, pO2 of 67. EKG showed normal sinus rhythm at 76 BPM. Chest x-ray showed elevated right hemidiaphragm with right lung base infiltrate or atelectasis and diffuse interstitial edema. Previous ABG showed pH of 7.24, pCO2 of 57, pO2 of 64.   IMPRESSION:  1.  Acute respiratory failure.  2.  Hypomagnesemia.  3.  Hypokalemia.  4.  Chronic obstructive pulmonary disease.  5.  Hypertension.  6.  Tobacco abuse.   RECOMMENDATIONS:  1.  The patient has been transferred to CCU after he developed respiratory failure. The  patient is on Ventimask. We will continue Ventimask and telemonitor, and also we will give Solu-Medrol IV 40 mg q.8 hours and continue DuoNeb and continue Spiriva.  2.  For hypertension, we will give hydralazine IV p.r.n. for now and resume home hypertension medication after the patient resumes diet.  3.  For hypokalemia, we will give IV potassium. For hypomagnesemia, we will give  supplement and follow up level.  4.  Smoking cessation. Was counseled for 5 minutes.  5.  GI and DVT prophylaxis.  6.  Discussed the patient's condition and recommendations with the patient and the patient's family member.   TIME SPENT: About 58 minutes.   ____________________________ Demetrios Loll, MD qc:jm D: 05/01/2012 16:24:53 ET T: 05/01/2012 17:30:37 ET JOB#: [id number]  cc: Demetrios Loll, MD, <Dictator> Demetrios Loll MD ELECTRONICALLY SIGNED 05/01/2012 19:50

## 2014-05-02 NOTE — Op Note (Signed)
PATIENT NAME:  Cameron Howe, Cameron Howe MR#:  [id number] DATE OF BIRTH:  [birth year]  DATE OF PROCEDURE:  05/01/2012  PREOPERATIVE DIAGNOSIS: Colon cancer with desire for colostomy reversal.   POSTOPERATIVE DIAGNOSIS: Colon cancer with desire for colostomy reversal.   PROCEDURE PERFORMED:  1.  Laparotomy and lysis of adhesions, complex.  2.  Takedown colostomy with partial colectomy, stapled low colopelvic anastomosis 29 mm ILS.  3.  Incidental appendectomy.   SURGEON: Sherri Rad, MD   ASSISTANT: Rodena Goldmann, III, MD   TYPE OF ANESTHESIA: General endotracheal.   FINDINGS: There were extensive adhesions of the omentum to the entire transverse colon and pelvis. Intact staple line. Donuts were thick.   ESTIMATED BLOOD LOSS: 400 mL.   DRAINS: None.   COUNTS:  Lap and needle count correct x 2.   DESCRIPTION OF PROCEDURE: With informed consent, supine position and general anesthesia, cystoscopy was performed by Urology with bilateral ureteral stent placement. The patient's perineum was prepped and draped with Betadine solution. The abdomen was clipped of hair, prepped and draped with Betadine solution. The ostomy was excluded with a Ray-Tec sponge and Ioban. Timeout was observed. A primary midline skin incision was fashioned with a scalpel and carried through musculofascial layers and scar with electrocautery. Omental adhesions were taken down off the anterior abdominal wall with sharp dissection and point cautery. Adhesiolysis was undertaken. The rectal stump was identified. It was circumferentially freed from the bladder anteriorly. The staple line and Prolene suture were identified. At this point, the ostomy was then removed from the anterior abdominal wall by an elliptical incision through skin and electrocautery through subcutaneous tissues down to the fascia. The portion of the colon going through the subcutaneous tissues and skin was excised with a GIA-75 stapler and submitted as specimen.  Adhesiolysis of the omentum to the transverse colon and hepatic flexure were taken down with electrocautery and the application of the LigaSure apparatus. Sufficient redundancy in length of the colon was obtained in this maneuver. Furthermore, adhesiolysis of small bowel loops to each other as well as to the colon and to the retroperitoneum were taken down with electrocautery and sharp dissection. With sufficient length on the colon, end-to-end anastomosis was planned with the EEA stapler through the rectum. The proximal staple line was excised after application of a pursestring applicator and 2-0 nylon sutures on a Look needle. The proximal colon easily accepted up to a 33 mm dilator without difficulty. A 29 mm stapler was chosen. The anvil was secured in the proximal staple line with pursestring suture. Dr. Pat Patrick then just placed dilators, followed by the stapler directly into the rectum and into the distal staple line. The spike was driven directly through the staple line. The coupling mechanism was engaged.  The stapler was closed and then fired. Two intact donuts were obtained. The anastomosis appeared to be airtight under proximal occlusion and saline immersion. Fibrin glue, 5 mL, was placed around the anastomosis. Incidental appendectomy was performed by dividing the mesoappendix with the LigaSure device. A second fire of the GIA-75 stapler was used to transect the appendix at its base. The omentum was then inspected for hemostasis. Multiple points were ligated with 2-0 Vicryl suture. A small hole in the omentum was reapproximated with a running locking 2-0 chromic suture. Hemostasis of point bleeding point was obtained in the right upper quadrant.   With lap and needle count correct x 2, the abdominal wall and cavity was irrigated with a liter of normal saline and  aspirated dry. The operating team changed gown and changed outer gloves. The ostial fascial defect was reapproximated with a #1 running PDS  suture. Seprafilm was placed into the abdomen. Midline fascia was closed from the extremes of the wound with a running #1 looped PDS with internal #1 retention sutures of Vicryl. The skin edges were reapproximated utilizing a skin stapler, and a sterile drip occlusive dressing was placed. The patient was then subsequently extubated and taken to the recovery room in stable and satisfactory condition. I removed both ureteral stents but left the Foley intact.    ____________________________ Cameron Howe. Cameron Gravel, MD mab:cb D: 05/01/2012 12:22:00 ET T: 05/01/2012 15:36:22 ET JOB#: [id number]  cc: Delorise Shiner K. Oliva Bustard, MD Dianah Field Mable Fill, MD Cameron Howe. Cameron Gravel, MD, <Dictator>   Minard Millirons Bettina Gavia MD ELECTRONICALLY SIGNED 05/17/2012 10:35

## 2014-05-02 NOTE — Op Note (Signed)
PATIENT NAME:  Cameron Howe, Cameron Howe MR#:  [id number] DATE OF BIRTH:  1945-11-27  DATE OF PROCEDURE:  04/30/2012  PREOPERATIVE DIAGNOSIS: Colon cancer with desire for colostomy reversal.   POSTOPERATIVE DIAGNOSIS: Colon cancer with desire for colostomy reversal.   PROCEDURE PERFORMED:  1.  Laparotomy and lysis of adhesions, complex.  2.  Takedown colostomy with partial colectomy, stapled low colopelvic anastomosis 29 mm ILS.  3.  Incidental appendectomy.   SURGEON: Sherri Rad, MD   ASSISTANT: Rodena Goldmann, III, MD   TYPE OF ANESTHESIA: General endotracheal.   FINDINGS: There were extensive adhesions of the omentum to the entire transverse colon and pelvis. Intact staple line. Donuts were thick.   ESTIMATED BLOOD LOSS: 400 mL.   DRAINS: None.   COUNTS:  Lap and needle count correct x 2.   DESCRIPTION OF PROCEDURE: With informed consent, supine position and general anesthesia, cystoscopy was performed by Urology with bilateral ureteral stent placement. The patient's perineum was prepped and draped with Betadine solution. The abdomen was clipped of hair, prepped and draped with Betadine solution. The ostomy was excluded with a Ray-Tec sponge and Ioban. Timeout was observed. A primary midline skin incision was fashioned with a scalpel and carried through musculofascial layers and scar with electrocautery. Omental adhesions were taken down off the anterior abdominal wall with sharp dissection and point cautery. Adhesiolysis was undertaken. The rectal stump was identified. It was circumferentially freed from the bladder anteriorly. The staple line and Prolene suture were identified. At this point, the ostomy was then removed from the anterior abdominal wall by an elliptical incision through skin and electrocautery through subcutaneous tissues down to the fascia. The portion of the colon going through the subcutaneous tissues and skin was excised with a GIA-75 stapler and submitted as specimen.  Adhesiolysis of the omentum to the transverse colon and hepatic flexure were taken down with electrocautery and the application of the LigaSure apparatus. Sufficient redundancy in length of the colon was obtained in this maneuver. Furthermore, adhesiolysis of small bowel loops to each other as well as to the colon and to the retroperitoneum were taken down with electrocautery and sharp dissection. With sufficient length on the colon, end-to-end anastomosis was planned with the EEA stapler through the rectum. The proximal staple line was excised after application of a pursestring applicator and 2-0 nylon sutures on a Look needle. The proximal colon easily accepted up to a 33 mm dilator without difficulty. A 29 mm stapler was chosen. The anvil was secured in the proximal staple line with pursestring suture. Dr. Pat Patrick then just placed dilators, followed by the stapler directly into the rectum and into the distal staple line. The spike was driven directly through the staple line. The coupling mechanism was engaged.  The stapler was closed and then fired. Two intact donuts were obtained. The anastomosis appeared to be airtight under proximal occlusion and saline immersion. Fibrin glue, 5 mL, was placed around the anastomosis. Incidental appendectomy was performed by dividing the mesoappendix with the LigaSure device. A second fire of the GIA-75 stapler was used to transect the appendix at its base. The omentum was then inspected for hemostasis. Multiple points were ligated with 2-0 Vicryl suture. A small hole in the omentum was reapproximated with a running locking 2-0 chromic suture. Hemostasis of point bleeding point was obtained in the right upper quadrant.   With lap and needle count correct x 2, the abdominal wall and cavity was irrigated with a liter of normal saline and  aspirated dry. The operating team changed gown and changed outer gloves. The ostial fascial defect was reapproximated with a #1 running PDS  suture. Seprafilm was placed into the abdomen. Midline fascia was closed from the extremes of the wound with a running #1 looped PDS with internal #1 retention sutures of Vicryl. The skin edges were reapproximated utilizing a skin stapler, and a sterile drip occlusive dressing was placed. The patient was then subsequently extubated and taken to the recovery room in stable and satisfactory condition. I removed both ureteral stents but left the Foley intact.  ____________________ ________ [name], MD FACS mab:cb D: 05/01/2012 12:22:00 ET T: 05/01/2012 15:36:22 ET JOB#: [id number]  cc: Delorise Shiner K. Oliva Bustard, MD Dianah Field Mable Fill, MD [name], MD, <Dictator> Hortencia Conradi MD ELECTRONICALLY SIGNED 05/04/2012 19:50

## 2014-05-04 NOTE — Op Note (Signed)
PATIENT NAME:  Cameron Howe, Cameron Howe MR#:  [id number] DATE OF BIRTH:  [birth year]  DATE OF PROCEDURE:  01/24/2011  PREOPERATIVE DIAGNOSIS: Colon cancer.   POSTOPERATIVE DIAGNOSIS: Colon cancer.   PROCEDURE PERFORMED:  1. Insertion of right internal jugular vein tunneled permanent intravenous line with subcutaneous reservoir.  2. Intraoperative use of fluoroscopy.  3. Intraoperative use of vascular ultrasound to determine vessel location and patency.   SURGEON: Sherri Rad, MD   ANESTHESIA: 30 mL of 0.25% plain Marcaine and monitored anesthesia care.   FINDINGS: Chest x-ray demonstrated adequate line placement.   DESCRIPTION OF PROCEDURE: With the patient in the supine position, monitoring and anesthesia lines were placed. The patient's right neck and upper chest were sterilely prepped and draped with ChloraPrep solution. A timeout procedure was observed.   The patient was then positioned in steep Trendelenburg position. A sterilely-draped handheld ultrasound probe was used to interrogate the course of the internal jugular vein on the right side demonstrating patency and compressibility. Local anesthesia was infiltrated over the course of the vein with a total of 10 mL of 0.25% plain Marcaine. Utilizing a 22-gauge finder needle, the vein was cannulated. A larger bore catheter needle was placed under direct ultrasound guidance. The needle was demonstrated to be venous flow. A wire was then passed. The needle was removed. Fluoroscopy demonstrated the wire to be in the proper right-sided chambers of the heart.   A subcutaneous pocket was then anesthetized on the anterior chest wall on the right side. A transverse skin incision was fashioned with a #11 blade. A subcutaneous pocket just above the clavipectoral fascia was fashioned with blunt dissection and cautery dissection. A small incision was then fashioned over the wire. The tunneling device was used to tunnel the catheter between these two sites. A  peel-away introducer and dilator were placed into the vein. The dilator and wire were removed. The catheter was then inserted through the peel-away introducer, and the peel-away introducer was removed leaving the catheter within the vein. The catheter was then shortened utilizing dynamic fluoroscopy to 23 cm. It flushed and aspirated easily at this location. It was amputated near the skin exit site in the chest wall and assembled to the reservoir device. The reservoir was then flushed easily, found to have no leaks, placed into the subcutaneous pocket and secured with 3-0 PDS x2 at two locations. A final flush utilizing 4 mL of 1000 units/mL heparinized saline was instilled into the reservoir and tubing. With hemostasis being ensured on the operative field, the deep layer of the pocket was reapproximated and obliterated with 3-0 Vicryl sutures. Interrupted inverted 3-0 deep dermal sutures were placed, followed by running 4-0 Monocryl in the skin. Dermabond was applied to the skin edges. Sterile occlusive dressings consisting of Telfa and Tegaderm were then applied. The patient was then transported in stable condition to the recovery room by Anesthesia.   ____________________________ [name], MD mab:cbb D: 01/24/2011 18:35:01 ET T: 01/25/2011 10:42:03 ET JOB#: [id number]  cc: Elta Guadeloupe A. Marina Gravel, MD, <Dictator> Ryleigh Esqueda A Leahmarie Gasiorowski MD ELECTRONICALLY SIGNED 01/28/2011 11:13

## 2014-10-06 DIAGNOSIS — Z720 Tobacco use: Secondary | ICD-10-CM | POA: Insufficient documentation

## 2014-10-24 ENCOUNTER — Other Ambulatory Visit: Payer: Self-pay | Admitting: *Deleted

## 2014-10-24 DIAGNOSIS — C189 Malignant neoplasm of colon, unspecified: Secondary | ICD-10-CM

## 2014-10-29 ENCOUNTER — Inpatient Hospital Stay (HOSPITAL_BASED_OUTPATIENT_CLINIC_OR_DEPARTMENT_OTHER): Payer: Medicare Other | Admitting: Oncology

## 2014-10-29 ENCOUNTER — Inpatient Hospital Stay: Payer: Medicare Other | Attending: Oncology

## 2014-10-29 VITALS — BP 175/94 | HR 67 | Temp 97.7°F | Ht 72.0 in | Wt 206.4 lb

## 2014-10-29 DIAGNOSIS — Z923 Personal history of irradiation: Secondary | ICD-10-CM | POA: Diagnosis not present

## 2014-10-29 DIAGNOSIS — Z9221 Personal history of antineoplastic chemotherapy: Secondary | ICD-10-CM | POA: Diagnosis not present

## 2014-10-29 DIAGNOSIS — F17210 Nicotine dependence, cigarettes, uncomplicated: Secondary | ICD-10-CM | POA: Insufficient documentation

## 2014-10-29 DIAGNOSIS — I10 Essential (primary) hypertension: Secondary | ICD-10-CM | POA: Insufficient documentation

## 2014-10-29 DIAGNOSIS — Z85038 Personal history of other malignant neoplasm of large intestine: Secondary | ICD-10-CM

## 2014-10-29 DIAGNOSIS — C189 Malignant neoplasm of colon, unspecified: Secondary | ICD-10-CM

## 2014-10-29 DIAGNOSIS — Z23 Encounter for immunization: Secondary | ICD-10-CM | POA: Diagnosis not present

## 2014-10-29 DIAGNOSIS — Z79899 Other long term (current) drug therapy: Secondary | ICD-10-CM | POA: Diagnosis not present

## 2014-10-29 LAB — CBC WITH DIFFERENTIAL/PLATELET
Basophils Absolute: 0 10*3/uL (ref 0–0.1)
Basophils Relative: 1 %
EOS ABS: 0.1 10*3/uL (ref 0–0.7)
Eosinophils Relative: 2 %
HCT: 52.9 % — ABNORMAL HIGH (ref 40.0–52.0)
Hemoglobin: 17.5 g/dL (ref 13.0–18.0)
Lymphocytes Relative: 35 %
Lymphs Abs: 1.6 10*3/uL (ref 1.0–3.6)
MCH: 29 pg (ref 26.0–34.0)
MCHC: 33.1 g/dL (ref 32.0–36.0)
MCV: 87.7 fL (ref 80.0–100.0)
Monocytes Absolute: 0.5 10*3/uL (ref 0.2–1.0)
Monocytes Relative: 11 %
Neutro Abs: 2.4 10*3/uL (ref 1.4–6.5)
Neutrophils Relative %: 51 %
PLATELETS: 154 10*3/uL (ref 150–440)
RBC: 6.03 MIL/uL — AB (ref 4.40–5.90)
RDW: 15.9 % — ABNORMAL HIGH (ref 11.5–14.5)
WBC: 4.6 10*3/uL (ref 3.8–10.6)

## 2014-10-29 LAB — COMPREHENSIVE METABOLIC PANEL
ALT: 17 U/L (ref 17–63)
AST: 21 U/L (ref 15–41)
Albumin: 4.1 g/dL (ref 3.5–5.0)
Alkaline Phosphatase: 89 U/L (ref 38–126)
Anion gap: 5 (ref 5–15)
BUN: 23 mg/dL — ABNORMAL HIGH (ref 6–20)
CHLORIDE: 104 mmol/L (ref 101–111)
CO2: 27 mmol/L (ref 22–32)
Calcium: 8.5 mg/dL — ABNORMAL LOW (ref 8.9–10.3)
Creatinine, Ser: 1.45 mg/dL — ABNORMAL HIGH (ref 0.61–1.24)
GFR calc non Af Amer: 48 mL/min — ABNORMAL LOW (ref >60)
GFR, EST AFRICAN AMERICAN: 56 mL/min — AB (ref >60)
Glucose, Bld: 99 mg/dL (ref 65–99)
POTASSIUM: 3.8 mmol/L (ref 3.5–5.1)
SODIUM: 136 mmol/L (ref 135–145)
Total Bilirubin: 0.4 mg/dL (ref 0.3–1.2)
Total Protein: 7.4 g/dL (ref 6.5–8.1)

## 2014-10-29 MED ORDER — GABAPENTIN 100 MG PO CAPS: 200.0000 mg | ORAL_CAPSULE | Freq: Two times a day (BID) | ORAL | Status: DC

## 2014-10-29 MED ORDER — INFLUENZA VAC SPLIT QUAD 0.5 ML IM SUSY
0.5000 mL | PREFILLED_SYRINGE | Freq: Once | INTRAMUSCULAR | Status: AC
  Administered 2014-10-29: 0.5 mL via INTRAMUSCULAR
  Filled 2014-10-29: qty 0.5

## 2014-10-30 LAB — CEA: CEA: 3.2 ng/mL (ref 0.0–4.7)

## 2014-11-01 ENCOUNTER — Encounter: Payer: Self-pay | Admitting: Oncology

## 2014-11-01 DIAGNOSIS — J449 Chronic obstructive pulmonary disease, unspecified: Secondary | ICD-10-CM | POA: Insufficient documentation

## 2014-11-01 DIAGNOSIS — Z87898 Personal history of other specified conditions: Secondary | ICD-10-CM | POA: Insufficient documentation

## 2014-11-01 DIAGNOSIS — E119 Type 2 diabetes mellitus without complications: Secondary | ICD-10-CM | POA: Insufficient documentation

## 2014-11-01 DIAGNOSIS — I10 Essential (primary) hypertension: Secondary | ICD-10-CM | POA: Insufficient documentation

## 2014-11-01 NOTE — Progress Notes (Signed)
[facility] @ [facility] Telephone:([phone]  Fax:([phone]) [place]: 1945-07-06  MR#: [id number]  JYN#:[id number]  Patient Care Team: Adrian Prows, MD as PCP - General (Infectious Diseases)  CHIEF COMPLAINT: /Oncology history Chief Complaint/Diagnosis:   I. Stage IIIc T4 B., N2, MX invasive adenocarcinoma of rectosigmoid colon. a. 11/27/10: colonscopy: - Malignant partially obstructing tumor in the descending colon. 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 b. 11/29/10:  Exploratory laparotomy with sigmoid colectomy and end colostomy. c. K-ras mutated. d. finished chemotherapy  with FOLFOX July 15, 2011 e. on 5-FU by continuous infusion and radiation therapy (September, 2013) patient has finished 5-FU and radiation therapy October of 2013 7.patient has undergone and reversal of colostomy     INTERVAL HISTORY:  [age] year old gentleman came today for further follow-up regarding carcinoma of colon stage IIIc status post chemoradiation therapy resection Patient continues to smoke.  No chills.  No fever.  No abdominal pain.  Patient does not remember exactly when the colonoscopy was done No rectal bleeding abdominal pain or weight loss REVIEW OF SYSTEMS:   GENERAL:  Feels good.  Active.  No fevers, sweats or weight loss. PERFORMANCE STATUS (ECOG):0 HEENT:  No visual changes, runny nose, sore throat, mouth sores or tenderness. Lungs: No shortness of breath or cough.  No hemoptysis. Cardiac:  No chest pain, palpitations, orthopnea, or PND. GI:  No nausea, vomiting, diarrhea, constipation, melena or hematochezia. GU:  No urgency, frequency, dysuria, or hematuria. Musculoskeletal:  No back pain.  No joint pain.  No muscle tenderness. Extremities:  No pain or swelling. Skin:  No rashes or skin changes. Neuro:  No headache, numbness or weakness, balance or coordination issues. Endocrine:  No diabetes, thyroid  issues, hot flashes or night sweats. Psych:  No mood changes, depression or anxiety. Pain:  No focal pain. Review of systems:  All other systems reviewed and found to be negative. As per HPI. Otherwise, a complete review of systems is negatve.  Significant History/PMH:   sacral decubitus ulcer:    Anemia:    Cancer, Colon:    Hypertension:    Colostomy:    Colectomy:   Preventive Screening:  Has patient had any of the following test? Colonscopy  Prostate Exam (1)   Last Colonoscopy: November 2012(1)   Last Prostate Exam: 2009(1)   Smoking History: Smoking History quit Nov 2012(1)  PFSH: Social History: noncontributory, negative alcohol, negative tobacco  Additional Past Medical and Surgical History: PAST MEDICAL HISTORY: Hypertension, but the patient does not take any medications for it as he has no insurance.     PAST SURGICAL HISTORY: None.   ADVANCED DIRECTIVES:  Patient does not have any living will or healthcare power of attorney.  Information was given .  Available resources had been discussed.  We will follow-up on subsequent appointments regarding this issue  HEALTH MAINTENANCE: Social History  Substance Use Topics  . Smoking status: Current Some Day Smoker  . Smokeless tobacco: None  . Alcohol Use: None      No Known Allergies  Current Outpatient Prescriptions  Medication Sig Dispense Refill  . hydrochlorothiazide (HYDRODIURIL) 12.5 MG tablet Take by mouth.    . valsartan (DIOVAN) 160 MG tablet Take by mouth.    . gabapentin (NEURONTIN) 100 MG capsule Take 2 capsules (200 mg total) by mouth 2 (two) times daily. Increased to TID after 15 days. 100 capsule 2   No current facility-administered medications  for this visit.    OBJECTIVE:  Filed Vitals:   10/29/14 1147  BP: 175/94  Pulse: 67  Temp: 97.7 F (36.5 C)     Body mass index is 27.98 kg/(m^2).    ECOG FS:0 - Asymptomatic  PHYSICAL EXAM: GENERAL:  Well developed, well nourished,  sitting comfortably in the exam room in no acute distress. MENTAL STATUS:  Alert and oriented to person, place and time.  ENT:  Oropharynx clear without lesion.  Tongue normal. Mucous membranes moist.  RESPIRATORY:  Clear to auscultation without rales, wheezes or rhonchi. CARDIOVASCULAR:  Regular rate and rhythm without murmur, rub or gallop. BREAST:  Right breast without masses, skin changes or nipple discharge.  Left breast without masses, skin changes or nipple discharge. ABDOMEN:  Soft, non-tender, with active bowel sounds, and no hepatosplenomegaly.  No masses. BACK:  No CVA tenderness.  No tenderness on percussion of the back or rib cage. SKIN:  No rashes, ulcers or lesions. EXTREMITIES: No edema, no skin discoloration or tenderness.  No palpable cords. LYMPH NODES: No palpable cervical, supraclavicular, axillary or inguinal adenopathy  NEUROLOGICAL: Unremarkable. PSYCH:  Appropriate.   LAB RESULTS:  CBC Latest Ref Rng 10/29/2014 04/23/2014  WBC 3.8 - 10.6 K/uL 4.6 4.0  Hemoglobin 13.0 - 18.0 g/dL 17.5 17.6  Hematocrit 40.0 - 52.0 % 52.9(H) 54.1(H)  Platelets 150 - 440 K/uL 154 160    Appointment on 10/29/2014  Component Date Value Ref Range Status  . WBC 10/29/2014 4.6  3.8 - 10.6 K/uL Final  . RBC 10/29/2014 6.03* 4.40 - 5.90 MIL/uL Final  . Hemoglobin 10/29/2014 17.5  13.0 - 18.0 g/dL Final  . HCT 10/29/2014 52.9* 40.0 - 52.0 % Final  . MCV 10/29/2014 87.7  80.0 - 100.0 fL Final  . MCH 10/29/2014 29.0  26.0 - 34.0 pg Final  . MCHC 10/29/2014 33.1  32.0 - 36.0 g/dL Final  . RDW 10/29/2014 15.9* 11.5 - 14.5 % Final  . Platelets 10/29/2014 154  150 - 440 K/uL Final  . Neutrophils Relative % 10/29/2014 51   Final  . Neutro Abs 10/29/2014 2.4  1.4 - 6.5 K/uL Final  . Lymphocytes Relative 10/29/2014 35   Final  . Lymphs Abs 10/29/2014 1.6  1.0 - 3.6 K/uL Final  . Monocytes Relative 10/29/2014 11   Final  . Monocytes Absolute 10/29/2014 0.5  0.2 - 1.0 K/uL Final  .  Eosinophils Relative 10/29/2014 2   Final  . Eosinophils Absolute 10/29/2014 0.1  0 - 0.7 K/uL Final  . Basophils Relative 10/29/2014 1   Final  . Basophils Absolute 10/29/2014 0.0  0 - 0.1 K/uL Final  . Sodium 10/29/2014 136  135 - 145 mmol/L Final  . Potassium 10/29/2014 3.8  3.5 - 5.1 mmol/L Final  . Chloride 10/29/2014 104  101 - 111 mmol/L Final  . CO2 10/29/2014 27  22 - 32 mmol/L Final  . Glucose, Bld 10/29/2014 99  65 - 99 mg/dL Final  . BUN 10/29/2014 23* 6 - 20 mg/dL Final  . Creatinine, Ser 10/29/2014 1.45* 0.61 - 1.24 mg/dL Final  . Calcium 10/29/2014 8.5* 8.9 - 10.3 mg/dL Final  . Total Protein 10/29/2014 7.4  6.5 - 8.1 g/dL Final  . Albumin 10/29/2014 4.1  3.5 - 5.0 g/dL Final  . AST 10/29/2014 21  15 - 41 U/L Final  . ALT 10/29/2014 17  17 - 63 U/L Final  . Alkaline Phosphatase 10/29/2014 89  38 - 126 U/L Final  .  Total Bilirubin 10/29/2014 0.4  0.3 - 1.2 mg/dL Final  . GFR calc non Af Amer 10/29/2014 48* >60 mL/min Final  . GFR calc Af Amer 10/29/2014 56* >60 mL/min Final   Comment: (NOTE) The eGFR has been calculated using the CKD EPI equation. This calculation has not been validated in all clinical situations. eGFR's persistently <60 mL/min signify possible Chronic Kidney Disease.   . Anion gap 10/29/2014 5  5 - 15 Final  . CEA 10/29/2014 3.2  0.0 - 4.7 ng/mL Final   Comment: (NOTE)       Roche ECLIA methodology       Nonsmokers  <3.9                                     Smokers     <5.6 Performed At: [facility] [place], [place] [id number] Lindon Romp MD YG:[id number]        Ref Range 3d ago  40mo ago  52mo ago  18yr ago     CEA 0.0 - 4.7 ng/mL 3.2 3.3R, CM 3.0R, CM 3.1R, CM           ASSESSMENT: Impression:   1. Stage IIIc, K-ras mutated colon cancer.  Status post chemoradiation therapy Patient underwent reversal of colostomy Patient will get CT scan of abdomen and pelvis in 3 months for restaging.  Patient is also  being referred to gastroenterologist for a repeat colonoscopy. Patient has been counseled regarding smoking May consider CT screening test  All of the lab data has been reviewed and remain stable.  CEA remains stable.  Patient expressed understanding and was in agreement with this plan. He also understands that He can call clinic at any time with any questions, concerns, or complaints.    No matching staging information was found for the patient.  Forest Gleason, MD   11/01/2014 6:06 PM

## 2015-01-26 ENCOUNTER — Other Ambulatory Visit: Payer: Self-pay | Admitting: *Deleted

## 2015-01-26 ENCOUNTER — Other Ambulatory Visit: Payer: Self-pay | Admitting: Oncology

## 2015-01-26 ENCOUNTER — Telehealth: Payer: Self-pay | Admitting: *Deleted

## 2015-01-26 DIAGNOSIS — C7A8 Other malignant neuroendocrine tumors: Secondary | ICD-10-CM

## 2015-01-26 NOTE — Telephone Encounter (Signed)
Orders placed for labs by Doni.

## 2015-01-26 NOTE — Telephone Encounter (Signed)
Glenard Haring states patient is scheduled for CT in [employer].  An order for labs will need to be put in.  If any questions please call her @ [phone].

## 2015-01-27 ENCOUNTER — Ambulatory Visit
Admission: RE | Admit: 2015-01-27 | Discharge: 2015-01-27 | Disposition: A | Discharge status: Home / Self Care | Payer: Medicare Other | Source: Ambulatory Visit | Attending: Oncology | Admitting: Oncology

## 2015-01-27 DIAGNOSIS — Z9049 Acquired absence of other specified parts of digestive tract: Secondary | ICD-10-CM | POA: Insufficient documentation

## 2015-01-27 DIAGNOSIS — C189 Malignant neoplasm of colon, unspecified: Secondary | ICD-10-CM | POA: Diagnosis present

## 2015-01-27 HISTORY — DX: Malignant (primary) neoplasm, unspecified: C80.1

## 2015-01-27 HISTORY — DX: Essential (primary) hypertension: I10

## 2015-01-27 LAB — COMPREHENSIVE METABOLIC PANEL
ALT: 22 U/L (ref 17–63)
AST: 21 U/L (ref 15–41)
Albumin: 4.2 g/dL (ref 3.5–5.0)
Alkaline Phosphatase: 89 U/L (ref 38–126)
Anion gap: 10 (ref 5–15)
BUN: 24 mg/dL — AB (ref 6–20)
CHLORIDE: 101 mmol/L (ref 101–111)
CO2: 26 mmol/L (ref 22–32)
Calcium: 9.2 mg/dL (ref 8.9–10.3)
Creatinine, Ser: 1.22 mg/dL (ref 0.61–1.24)
GFR calc non Af Amer: 59 mL/min — ABNORMAL LOW (ref >60)
Glucose, Bld: 114 mg/dL — ABNORMAL HIGH (ref 65–99)
Potassium: 3.9 mmol/L (ref 3.5–5.1)
Sodium: 137 mmol/L (ref 135–145)
Total Bilirubin: 0.5 mg/dL (ref 0.3–1.2)
Total Protein: 7.7 g/dL (ref 6.5–8.1)

## 2015-01-27 LAB — CBC WITH DIFFERENTIAL/PLATELET
Basophils Absolute: 0 10*3/uL (ref 0–0.1)
Basophils Relative: 0 %
Eosinophils Absolute: 0.1 10*3/uL (ref 0–0.7)
Eosinophils Relative: 2 %
HCT: 54.9 % — ABNORMAL HIGH (ref 40.0–52.0)
HEMOGLOBIN: 18.2 g/dL — AB (ref 13.0–18.0)
LYMPHS ABS: 1.4 10*3/uL (ref 1.0–3.6)
Lymphocytes Relative: 27 %
MCH: 29.1 pg (ref 26.0–34.0)
MCHC: 33.1 g/dL (ref 32.0–36.0)
MCV: 88 fL (ref 80.0–100.0)
Monocytes Absolute: 0.5 10*3/uL (ref 0.2–1.0)
Monocytes Relative: 9 %
NEUTROS ABS: 3.2 10*3/uL (ref 1.4–6.5)
NEUTROS PCT: 62 %
Platelets: 145 10*3/uL — ABNORMAL LOW (ref 150–440)
RBC: 6.24 MIL/uL — AB (ref 4.40–5.90)
RDW: 15.7 % — ABNORMAL HIGH (ref 11.5–14.5)
WBC: 5.2 10*3/uL (ref 3.8–10.6)

## 2015-01-27 MED ORDER — IOHEXOL 300 MG/ML  SOLN
100.0000 mL | Freq: Once | INTRAMUSCULAR | Status: AC | PRN
  Administered 2015-01-27: 100 mL via INTRAVENOUS

## 2015-01-28 ENCOUNTER — Other Ambulatory Visit: Payer: Self-pay | Admitting: *Deleted

## 2015-01-28 DIAGNOSIS — Z85038 Personal history of other malignant neoplasm of large intestine: Secondary | ICD-10-CM

## 2015-01-28 LAB — CEA: CEA: 3.7 ng/mL (ref 0.0–4.7)

## 2015-01-29 ENCOUNTER — Encounter: Payer: Self-pay | Admitting: Oncology

## 2015-01-29 ENCOUNTER — Inpatient Hospital Stay (HOSPITAL_BASED_OUTPATIENT_CLINIC_OR_DEPARTMENT_OTHER): Payer: Medicare Other | Admitting: Oncology

## 2015-01-29 ENCOUNTER — Inpatient Hospital Stay: Payer: Medicare Other | Attending: Oncology

## 2015-01-29 VITALS — BP 164/92 | HR 86 | Temp 97.3°F | Resp 18 | Wt 211.4 lb

## 2015-01-29 DIAGNOSIS — Z9221 Personal history of antineoplastic chemotherapy: Secondary | ICD-10-CM | POA: Insufficient documentation

## 2015-01-29 DIAGNOSIS — Z85038 Personal history of other malignant neoplasm of large intestine: Secondary | ICD-10-CM | POA: Diagnosis present

## 2015-01-29 DIAGNOSIS — F17210 Nicotine dependence, cigarettes, uncomplicated: Secondary | ICD-10-CM | POA: Insufficient documentation

## 2015-01-29 DIAGNOSIS — I10 Essential (primary) hypertension: Secondary | ICD-10-CM | POA: Diagnosis not present

## 2015-01-29 DIAGNOSIS — Z923 Personal history of irradiation: Secondary | ICD-10-CM | POA: Diagnosis not present

## 2015-01-29 DIAGNOSIS — Z79899 Other long term (current) drug therapy: Secondary | ICD-10-CM | POA: Diagnosis not present

## 2015-01-29 NOTE — Progress Notes (Signed)
[place] @ [facility] Telephone:(336) [phone]  Fax:(336) [place]: 10/02/1945  MR#: [id number]  GBT#:[id number]  Patient Care Team: Adrian Prows, MD as PCP - General (Infectious Diseases)  CHIEF COMPLAINT: /Oncology history Chief Complaint/Diagnosis:   I. Stage IIIc T4 B., N2, MX invasive adenocarcinoma of rectosigmoid colon. a. 11/27/10: colonscopy: - Malignant partially obstructing tumor in the descending colon. 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 b. 11/29/10:  Exploratory laparotomy with sigmoid colectomy and end colostomy. c. K-ras mutated. d. finished chemotherapy  with FOLFOX July 15, 2011 e. on 5-FU by continuous infusion and radiation therapy (September, 2013) patient has finished 5-FU and radiation therapy October of 2013 7.patient has undergone and reversal of colostomy     INTERVAL HISTORY:  [age] year old gentleman came today for further follow-up regarding carcinoma of colon stage IIIc status post chemoradiation therapy resection Patient continues to smoke.  No chills.  No fever.  No abdominal pain.  Patient does not remember exactly when the colonoscopy was done No rectal bleeding abdominal pain or weight loss  He   is here for further evaluation regarding CT scan of abdomen. CEA also has been evaluated which is within acceptable range.  Patient had a last colonoscopy was done in 2014 REVIEW OF SYSTEMS:   GENERAL:  Feels good.  Active.  No fevers, sweats or weight loss. PERFORMANCE STATUS (ECOG):0 HEENT:  No visual changes, runny nose, sore throat, mouth sores or tenderness. Lungs: No shortness of breath or cough.  No hemoptysis. Cardiac:  No chest pain, palpitations, orthopnea, or PND. GI:  No nausea, vomiting, diarrhea, constipation, melena or hematochezia. GU:  No urgency, frequency, dysuria, or hematuria. Musculoskeletal:  No back pain.  No joint pain.  No muscle  tenderness. Extremities:  No pain or swelling. Skin:  No rashes or skin changes. Neuro:  No headache, numbness or weakness, balance or coordination issues. Endocrine:  No diabetes, thyroid issues, hot flashes or night sweats. Psych:  No mood changes, depression or anxiety. Pain:  No focal pain. Review of systems:  All other systems reviewed and found to be negative. As per HPI. Otherwise, a complete review of systems is negatve.  Significant History/PMH:   sacral decubitus ulcer:    Anemia:    Cancer, Colon:    Hypertension:    Colostomy:    Colectomy:   Preventive Screening:  Has patient had any of the following test? Colonscopy  Prostate Exam (1)   Last Colonoscopy: November 2012(1)   Last Prostate Exam: 2009(1)   Smoking History: Smoking History quit Nov 2012(1)  PFSH: Social History: noncontributory, negative alcohol, negative tobacco  Additional Past Medical and Surgical History: PAST MEDICAL HISTORY: Hypertension, but the patient does not take any medications for it as he has no insurance.     PAST SURGICAL HISTORY: None.   ADVANCED DIRECTIVES:  Patient does not have any living will or healthcare power of attorney.  Information was given .  Available resources had been discussed.  We will follow-up on subsequent appointments regarding this issue  HEALTH MAINTENANCE: Social History  Substance Use Topics  . Smoking status: Current Some Day Smoker  . Smokeless tobacco: None  . Alcohol Use: None      No Known Allergies  Current Outpatient Prescriptions  Medication Sig Dispense Refill  . gabapentin (NEURONTIN) 100 MG capsule Take 2 capsules (200 mg total) by mouth 2 (two) times daily. Increased to TID after 15  days. 100 capsule 2  . hydrochlorothiazide (HYDRODIURIL) 12.5 MG tablet Take by mouth.    . valsartan (DIOVAN) 160 MG tablet Take by mouth.     No current facility-administered medications for this visit.    OBJECTIVE:  Filed Vitals:    01/29/15 1043  BP: 164/92  Pulse: 86  Temp: 97.3 F (36.3 C)  Resp: 18     Body mass index is 28.67 kg/(m^2).    ECOG FS:0 - Asymptomatic  PHYSICAL EXAM: GENERAL:  Well developed, well nourished, sitting comfortably in the exam room in no acute distress. MENTAL STATUS:  Alert and oriented to person, place and time.  ENT:  Oropharynx clear without lesion.  Tongue normal. Mucous membranes moist.  RESPIRATORY:  Clear to auscultation without rales, wheezes or rhonchi. CARDIOVASCULAR:  Regular rate and rhythm without murmur, rub or gallop. BREAST:  Right breast without masses, skin changes or nipple discharge.  Left breast without masses, skin changes or nipple discharge. ABDOMEN:  Soft, non-tender, with active bowel sounds, and no hepatosplenomegaly.  No masses. BACK:  No CVA tenderness.  No tenderness on percussion of the back or rib cage. SKIN:  No rashes, ulcers or lesions. EXTREMITIES: No edema, no skin discoloration or tenderness.  No palpable cords. LYMPH NODES: No palpable cervical, supraclavicular, axillary or inguinal adenopathy  NEUROLOGICAL: Unremarkable. PSYCH:  Appropriate.   LAB RESULTS:  CBC Latest Ref Rng 01/27/2015 10/29/2014  WBC 3.8 - 10.6 K/uL 5.2 4.6  Hemoglobin 13.0 - 18.0 g/dL 18.2(H) 17.5  Hematocrit 40.0 - 52.0 % 54.9(H) 52.9(H)  Platelets 150 - 440 K/uL 145(L) 154    Hospital Outpatient Visit on 01/27/2015  Component Date Value Ref Range Status  . WBC 01/27/2015 5.2  3.8 - 10.6 K/uL Final  . RBC 01/27/2015 6.24* 4.40 - 5.90 MIL/uL Final  . Hemoglobin 01/27/2015 18.2* 13.0 - 18.0 g/dL Final  . HCT 01/27/2015 54.9* 40.0 - 52.0 % Final  . MCV 01/27/2015 88.0  80.0 - 100.0 fL Final  . MCH 01/27/2015 29.1  26.0 - 34.0 pg Final  . MCHC 01/27/2015 33.1  32.0 - 36.0 g/dL Final  . RDW 01/27/2015 15.7* 11.5 - 14.5 % Final  . Platelets 01/27/2015 145* 150 - 440 K/uL Final  . Neutrophils Relative % 01/27/2015 62   Final  . Neutro Abs 01/27/2015 3.2  1.4 - 6.5  K/uL Final  . Lymphocytes Relative 01/27/2015 27   Final  . Lymphs Abs 01/27/2015 1.4  1.0 - 3.6 K/uL Final  . Monocytes Relative 01/27/2015 9   Final  . Monocytes Absolute 01/27/2015 0.5  0.2 - 1.0 K/uL Final  . Eosinophils Relative 01/27/2015 2   Final  . Eosinophils Absolute 01/27/2015 0.1  0 - 0.7 K/uL Final  . Basophils Relative 01/27/2015 0   Final  . Basophils Absolute 01/27/2015 0.0  0 - 0.1 K/uL Final  . Sodium 01/27/2015 137  135 - 145 mmol/L Final  . Potassium 01/27/2015 3.9  3.5 - 5.1 mmol/L Final  . Chloride 01/27/2015 101  101 - 111 mmol/L Final  . CO2 01/27/2015 26  22 - 32 mmol/L Final  . Glucose, Bld 01/27/2015 114* 65 - 99 mg/dL Final  . BUN 01/27/2015 24* 6 - 20 mg/dL Final  . Creatinine, Ser 01/27/2015 1.22  0.61 - 1.24 mg/dL Final  . Calcium 01/27/2015 9.2  8.9 - 10.3 mg/dL Final  . Total Protein 01/27/2015 7.7  6.5 - 8.1 g/dL Final  . Albumin 01/27/2015 4.2  3.5 - 5.0  g/dL Final  . AST 01/27/2015 21  15 - 41 U/L Final  . ALT 01/27/2015 22  17 - 63 U/L Final  . Alkaline Phosphatase 01/27/2015 89  38 - 126 U/L Final  . Total Bilirubin 01/27/2015 0.5  0.3 - 1.2 mg/dL Final  . GFR calc non Af Amer 01/27/2015 59* >60 mL/min Final  . GFR calc Af Amer 01/27/2015 >60  >60 mL/min Final   Comment: (NOTE) The eGFR has been calculated using the CKD EPI equation. This calculation has not been validated in all clinical situations. eGFR's persistently <60 mL/min signify possible Chronic Kidney Disease.   . Anion gap 01/27/2015 10  5 - 15 Final  . CEA 01/27/2015 3.7  0.0 - 4.7 ng/mL Final   Comment: (NOTE)       Roche ECLIA methodology       Nonsmokers  <3.9                                     Smokers     <5.6 Performed At: [facility] [place], [place] [id number] Lindon Romp MD CV:[id number]           ASSESSMENT: Impression:   1. Stage IIIc, K-ras mutated colon cancer.  Status post chemoradiation therapy Patient underwent reversal  of colostomy   2.CEA is 3.7 3.  In his chronic smoker and has been counseled one more time regarding stopping smoking, 4.  CT scan has been reviewed independently shows no evidence of recurrent or progressive disease. Duration of visit is 25 minutes and 50% of time was spent regarding counseling smoking cessation as well as getting another colonoscopy.  We have arranged patient to be seen by gastroenterology clinic  In [place] clinic      All of the lab data has been reviewed and remain stable.  CEA remains stable.  Patient expressed understanding and was in agreement with this plan. He also understands that He can call clinic at any time with any questions, concerns, or complaints.    No matching staging information was found for the patient.  Forest Gleason, MD   01/29/2015 11:52 AM

## 2015-01-29 NOTE — Progress Notes (Signed)
Patient here today for CT results. 

## 2015-04-17 NOTE — Discharge Instructions (Signed)

## 2015-04-21 ENCOUNTER — Ambulatory Visit
Admission: RE | Admit: 2015-04-21 | Discharge: 2015-04-21 | Disposition: A | Discharge status: Home / Self Care | Payer: Medicare Other | Source: Ambulatory Visit | Attending: Gastroenterology | Admitting: Gastroenterology

## 2015-04-21 ENCOUNTER — Encounter
Admission: RE | Disposition: A | Discharge status: Home / Self Care | Payer: Self-pay | Source: Ambulatory Visit | Attending: Gastroenterology

## 2015-04-21 ENCOUNTER — Ambulatory Visit: Payer: Medicare Other | Admitting: Anesthesiology

## 2015-04-21 DIAGNOSIS — I10 Essential (primary) hypertension: Secondary | ICD-10-CM | POA: Insufficient documentation

## 2015-04-21 DIAGNOSIS — Z79899 Other long term (current) drug therapy: Secondary | ICD-10-CM | POA: Diagnosis not present

## 2015-04-21 DIAGNOSIS — Z823 Family history of stroke: Secondary | ICD-10-CM | POA: Insufficient documentation

## 2015-04-21 DIAGNOSIS — D120 Benign neoplasm of cecum: Secondary | ICD-10-CM | POA: Insufficient documentation

## 2015-04-21 DIAGNOSIS — Z803 Family history of malignant neoplasm of breast: Secondary | ICD-10-CM | POA: Insufficient documentation

## 2015-04-21 DIAGNOSIS — Z85038 Personal history of other malignant neoplasm of large intestine: Secondary | ICD-10-CM | POA: Insufficient documentation

## 2015-04-21 DIAGNOSIS — E119 Type 2 diabetes mellitus without complications: Secondary | ICD-10-CM | POA: Insufficient documentation

## 2015-04-21 DIAGNOSIS — Z87891 Personal history of nicotine dependence: Secondary | ICD-10-CM | POA: Insufficient documentation

## 2015-04-21 DIAGNOSIS — Z9889 Other specified postprocedural states: Secondary | ICD-10-CM | POA: Diagnosis not present

## 2015-04-21 DIAGNOSIS — D122 Benign neoplasm of ascending colon: Secondary | ICD-10-CM | POA: Insufficient documentation

## 2015-04-21 DIAGNOSIS — Z9221 Personal history of antineoplastic chemotherapy: Secondary | ICD-10-CM | POA: Diagnosis not present

## 2015-04-21 DIAGNOSIS — R200 Anesthesia of skin: Secondary | ICD-10-CM | POA: Diagnosis not present

## 2015-04-21 DIAGNOSIS — Z1211 Encounter for screening for malignant neoplasm of colon: Secondary | ICD-10-CM | POA: Diagnosis not present

## 2015-04-21 DIAGNOSIS — J449 Chronic obstructive pulmonary disease, unspecified: Secondary | ICD-10-CM | POA: Diagnosis not present

## 2015-04-21 HISTORY — PX: COLONOSCOPY: SHX5424

## 2015-04-21 HISTORY — DX: Presence of dental prosthetic device (complete) (partial): Z97.2

## 2015-04-21 HISTORY — DX: Myoneural disorder, unspecified: G70.9

## 2015-04-21 HISTORY — DX: Type 2 diabetes mellitus without complications: E11.9

## 2015-04-21 HISTORY — PX: POLYPECTOMY: SHX5525

## 2015-04-21 HISTORY — DX: Unspecified osteoarthritis, unspecified site: M19.90

## 2015-04-21 LAB — GLUCOSE, CAPILLARY: GLUCOSE-CAPILLARY: 99 mg/dL (ref 65–99)

## 2015-04-21 SURGERY — COLONOSCOPY
Anesthesia: Monitor Anesthesia Care | Site: Rectum | Wound class: Dirty or Infected

## 2015-04-21 MED ORDER — LIDOCAINE HCL (CARDIAC) 20 MG/ML IV SOLN
INTRAVENOUS | Status: DC | PRN
  Administered 2015-04-21: 40 mg via INTRAVENOUS

## 2015-04-21 MED ORDER — LACTATED RINGERS IV SOLN: 500.0000 mL | INTRAVENOUS | Status: DC

## 2015-04-21 MED ORDER — LACTATED RINGERS IV SOLN
INTRAVENOUS | Status: DC
  Administered 2015-04-21: 11:00:00 via INTRAVENOUS

## 2015-04-21 MED ORDER — STERILE WATER FOR IRRIGATION IR SOLN
Status: DC | PRN
  Administered 2015-04-21: 12:00:00

## 2015-04-21 MED ORDER — PROPOFOL 10 MG/ML IV BOLUS
INTRAVENOUS | Status: DC | PRN
  Administered 2015-04-21: 20 mg via INTRAVENOUS
  Administered 2015-04-21: 10 mg via INTRAVENOUS
  Administered 2015-04-21: 20 mg via INTRAVENOUS
  Administered 2015-04-21: 70 mg via INTRAVENOUS
  Administered 2015-04-21: 20 mg via INTRAVENOUS
  Administered 2015-04-21 (×2): 30 mg via INTRAVENOUS
  Administered 2015-04-21 (×2): 20 mg via INTRAVENOUS

## 2015-04-21 SURGICAL SUPPLY — 30 items
CANISTER SUCT 1200ML W/VALVE (MISCELLANEOUS) ×3 IMPLANT
FCP ESCP3.2XJMB 240X2.8X (MISCELLANEOUS)
FORCEPS BIOP RAD 4 LRG CAP 4 (CUTTING FORCEPS) ×3 IMPLANT
FORCEPS BIOP RJ4 240 W/NDL (MISCELLANEOUS)
FORCEPS ESCP3.2XJMB 240X2.8X (MISCELLANEOUS) IMPLANT
GOWN CVR UNV OPN BCK APRN NK (MISCELLANEOUS) ×1 IMPLANT
GOWN ISOL THUMB LOOP REG UNIV (MISCELLANEOUS) ×2
GOWN STRL REUS W/ TWL LRG LVL3 (GOWN DISPOSABLE) ×1 IMPLANT
GOWN STRL REUS W/TWL LRG LVL3 (GOWN DISPOSABLE) ×2
HEMOCLIP INSTINCT (CLIP) IMPLANT
INJECTOR VARIJECT VIN23 (MISCELLANEOUS) IMPLANT
KIT CO2 TUBING (TUBING) IMPLANT
KIT DEFENDO VALVE AND CONN (KITS) IMPLANT
KIT ENDO PROCEDURE OLY (KITS) ×3 IMPLANT
LIGATOR MULTIBAND 6SHOOTER MBL (MISCELLANEOUS) IMPLANT
MARKER SPOT ENDO TATTOO 5ML (MISCELLANEOUS) IMPLANT
PAD GROUND ADULT SPLIT (MISCELLANEOUS) ×3 IMPLANT
SNARE SHORT THROW 13M SML OVAL (MISCELLANEOUS) ×3 IMPLANT
SNARE SHORT THROW 30M LRG OVAL (MISCELLANEOUS) IMPLANT
SPOT EX ENDOSCOPIC TATTOO (MISCELLANEOUS)
SUCTION POLY TRAP 4CHAMBER (MISCELLANEOUS) IMPLANT
TRAP SUCTION POLY (MISCELLANEOUS) ×3 IMPLANT
TUBING CONN 6MMX3.1M (TUBING)
TUBING SUCTION CONN 0.25 STRL (TUBING) IMPLANT
UNDERPAD 30X60 958B10 (PK) (MISCELLANEOUS) ×3 IMPLANT
VALVE BIOPSY ENDO (VALVE) IMPLANT
VARIJECT INJECTOR VIN23 (MISCELLANEOUS)
WATER AUXILLARY (MISCELLANEOUS) IMPLANT
WATER STERILE IRR 250ML POUR (IV SOLUTION) IMPLANT
WATER STERILE IRR 500ML POUR (IV SOLUTION) IMPLANT

## 2015-04-21 NOTE — Transfer of Care (Signed)
Immediate Anesthesia Transfer of Care Note  Patient: Cameron Howe  Procedure(s) Performed: Procedure(s) with comments: COLONOSCOPY (N/A) - DIABETIC, DIET CONTROLLED POLYPECTOMY (N/A)  Patient Location: PACU  Anesthesia Type: MAC  Level of Consciousness: awake, alert  and patient cooperative  Airway and Oxygen Therapy: Patient Spontanous Breathing and Patient connected to supplemental oxygen  Post-op Assessment: Post-op Vital signs reviewed, Patient's Cardiovascular Status Stable, Respiratory Function Stable, Patent Airway and No signs of Nausea or vomiting  Post-op Vital Signs: Reviewed and stable  Complications: No apparent anesthesia complications

## 2015-04-21 NOTE — Anesthesia Preprocedure Evaluation (Signed)
Anesthesia Evaluation  Patient identified by MRN, date of birth, ID band Patient awake    Reviewed: Allergy & Precautions, H&P , NPO status , Patient's Chart, lab work & pertinent test results, reviewed documented beta blocker date and time   Airway Mallampati: II  TM Distance: >3 FB Neck ROM: full    Dental no notable dental hx.    Pulmonary COPD, Current Smoker,    Pulmonary exam normal breath sounds clear to auscultation       Cardiovascular Exercise Tolerance: Good hypertension,  Rhythm:regular Rate:Normal     Neuro/Psych negative psych ROS   GI/Hepatic negative GI ROS, Neg liver ROS,   Endo/Other  diabetes, Well Controlled, Type 2  Renal/GU negative Renal ROS  negative genitourinary   Musculoskeletal   Abdominal   Peds  Hematology negative hematology ROS (+)   Anesthesia Other Findings   Reproductive/Obstetrics negative OB ROS                             Anesthesia Physical Anesthesia Plan  ASA: II  Anesthesia Plan: MAC   Post-op Pain Management:    Induction:   Airway Management Planned:   Additional Equipment:   Intra-op Plan:   Post-operative Plan:   Informed Consent: I have reviewed the patients History and Physical, chart, labs and discussed the procedure including the risks, benefits and alternatives for the proposed anesthesia with the patient or authorized representative who has indicated his/her understanding and acceptance.     Plan Discussed with: CRNA  Anesthesia Plan Comments:         Anesthesia Quick Evaluation

## 2015-04-21 NOTE — Anesthesia Postprocedure Evaluation (Signed)
Anesthesia Post Note  Patient: Cameron Howe  Procedure(s) Performed: Procedure(s) (LRB): COLONOSCOPY (N/A) POLYPECTOMY (N/A)  Patient location during evaluation: PACU Anesthesia Type: MAC Level of consciousness: awake and alert Pain management: pain level controlled Vital Signs Assessment: post-procedure vital signs reviewed and stable Respiratory status: spontaneous breathing, nonlabored ventilation and respiratory function stable Cardiovascular status: blood pressure returned to baseline and stable Postop Assessment: no signs of nausea or vomiting Anesthetic complications: no    DANIEL D KOVACS

## 2015-04-21 NOTE — H&P (Signed)
  Date of Initial H&P:03/24/2015 History reviewed, patient examined, no change in status, stable for surgery.

## 2015-04-21 NOTE — Op Note (Signed)
[facility] Gastroenterology Patient Name: Cameron Howe Procedure Date: 04/21/2015 11:19 AM MRN: [id number] Account #: [id number] Date of Birth: [birth year] Admit Type: Outpatient Age: 69 Room: [facility] OR ROOM 01 Gender: Male Note Status: Finalized Procedure:            Colonoscopy Indications:          Personal history of malignant neoplasm of the colon,                        Personal history of colonic polyps Providers:            Lupita Dawn. Candace Cruise, MD Referring MD:         Adrian Prows (Referring MD) Medicines:            Monitored Anesthesia Care Complications:        No immediate complications. Procedure:            Pre-Anesthesia Assessment:                       - Prior to the procedure, a History and Physical was                        performed, and patient medications, allergies and                        sensitivities were reviewed. The patient's tolerance of                        previous anesthesia was reviewed.                       - The risks and benefits of the procedure and the                        sedation options and risks were discussed with the                        patient. All questions were answered and informed                        consent was obtained.                       - After reviewing the risks and benefits, the patient                        was deemed in satisfactory condition to undergo the                        procedure.                       After obtaining informed consent, the colonoscope was                        passed under direct vision. Throughout the procedure,                        the patient's blood pressure, pulse, and oxygen  saturations were monitored continuously. The was                        introduced through the anus and advanced to the the                        cecum, identified by appendiceal orifice and ileocecal                        valve. The colonoscopy was performed  without                        difficulty. The patient tolerated the procedure well.                        The quality of the bowel preparation was fair. Findings:      A small polyp was found in the cecum. The polyp was sessile. The polyp       was removed with a hot snare. Resection and retrieval were complete.      Two sessile polyps were found in the ascending colon. The polyps were       small in size. These polyps were removed with a hot snare and biopsy       forceps. Resection and retrieval were complete.      The exam was otherwise without abnormality. Impression:           - Preparation of the colon was fair.                       - One small polyp in the cecum, SKIP and removed with a                        hot snare. Resected and retrieved.                       - Two small polyps in the ascending colon, removed with                        a hot snare. Resected and retrieved.                       - The examination was otherwise normal. Recommendation:       - Discharge patient to home.                       - Await pathology results.                       - Repeat colonoscopy in 3 years for surveillance based                        on pathology results.                       - The findings and recommendations were discussed with                        the patient. Procedure Code(s):    --- Professional ---  45385, Colonoscopy, flexible; with removal of tumor(s),                        polyp(s), or other lesion(s) by snare technique Diagnosis Code(s):    --- Professional ---                       D12.0, Benign neoplasm of cecum                       D12.2, Benign neoplasm of ascending colon                       Z85.038, Personal history of other malignant neoplasm                        of large intestine                       Z86.010, Personal history of colonic polyps CPT copyright 2016 American Medical Association. All rights reserved. The codes  documented in this report are preliminary and upon coder review may  be revised to meet current compliance requirements. Hulen Luster, MD 04/21/2015 11:45:37 AM This report has been signed electronically. Number of Addenda: 0 Note Initiated On: 04/21/2015 11:19 AM Scope Withdrawal Time: 0 hours 8 minutes 1 second  Total Procedure Duration: 0 hours 12 minutes 58 seconds       [facility]

## 2015-04-21 NOTE — Anesthesia Procedure Notes (Signed)
Procedure Name: MAC Performed by: Rylynne Schicker Pre-anesthesia Checklist: Patient identified, Emergency Drugs available, Suction available, Timeout performed and Patient being monitored Patient Re-evaluated:Patient Re-evaluated prior to inductionOxygen Delivery Method: Nasal cannula Placement Confirmation: positive ETCO2       

## 2015-04-22 ENCOUNTER — Encounter: Payer: Self-pay | Admitting: Gastroenterology

## 2015-04-23 LAB — SURGICAL PATHOLOGY

## 2015-07-30 ENCOUNTER — Inpatient Hospital Stay: Payer: Medicare Other | Attending: Oncology | Admitting: Oncology

## 2015-07-30 ENCOUNTER — Inpatient Hospital Stay: Payer: Medicare Other

## 2015-07-30 VITALS — BP 159/91 | HR 76 | Temp 97.2°F | Wt 206.0 lb

## 2015-07-30 DIAGNOSIS — Z79899 Other long term (current) drug therapy: Secondary | ICD-10-CM | POA: Insufficient documentation

## 2015-07-30 DIAGNOSIS — Z85038 Personal history of other malignant neoplasm of large intestine: Secondary | ICD-10-CM | POA: Insufficient documentation

## 2015-07-30 DIAGNOSIS — Z9221 Personal history of antineoplastic chemotherapy: Secondary | ICD-10-CM | POA: Diagnosis not present

## 2015-07-30 DIAGNOSIS — R531 Weakness: Secondary | ICD-10-CM | POA: Diagnosis not present

## 2015-07-30 DIAGNOSIS — C187 Malignant neoplasm of sigmoid colon: Secondary | ICD-10-CM

## 2015-07-30 DIAGNOSIS — Z7982 Long term (current) use of aspirin: Secondary | ICD-10-CM | POA: Diagnosis not present

## 2015-07-30 DIAGNOSIS — Z923 Personal history of irradiation: Secondary | ICD-10-CM | POA: Insufficient documentation

## 2015-07-30 DIAGNOSIS — E119 Type 2 diabetes mellitus without complications: Secondary | ICD-10-CM | POA: Diagnosis not present

## 2015-07-30 DIAGNOSIS — M129 Arthropathy, unspecified: Secondary | ICD-10-CM | POA: Diagnosis not present

## 2015-07-30 DIAGNOSIS — Z452 Encounter for adjustment and management of vascular access device: Secondary | ICD-10-CM | POA: Diagnosis not present

## 2015-07-30 DIAGNOSIS — I10 Essential (primary) hypertension: Secondary | ICD-10-CM | POA: Diagnosis not present

## 2015-07-30 DIAGNOSIS — F17210 Nicotine dependence, cigarettes, uncomplicated: Secondary | ICD-10-CM | POA: Diagnosis not present

## 2015-07-30 LAB — COMPREHENSIVE METABOLIC PANEL
ALBUMIN: 4.1 g/dL (ref 3.5–5.0)
ALK PHOS: 83 U/L (ref 38–126)
ALT: 16 U/L — AB (ref 17–63)
AST: 19 U/L (ref 15–41)
Anion gap: 5 (ref 5–15)
BUN: 20 mg/dL (ref 6–20)
CALCIUM: 8.9 mg/dL (ref 8.9–10.3)
CO2: 26 mmol/L (ref 22–32)
CREATININE: 1.27 mg/dL — AB (ref 0.61–1.24)
Chloride: 105 mmol/L (ref 101–111)
GFR calc Af Amer: >60 mL/min (ref >60)
GFR calc non Af Amer: 56 mL/min — ABNORMAL LOW (ref >60)
GLUCOSE: 101 mg/dL — AB (ref 65–99)
Potassium: 3.5 mmol/L (ref 3.5–5.1)
SODIUM: 136 mmol/L (ref 135–145)
Total Bilirubin: 0.5 mg/dL (ref 0.3–1.2)
Total Protein: 7.2 g/dL (ref 6.5–8.1)

## 2015-07-30 LAB — CBC WITH DIFFERENTIAL/PLATELET
BASOS PCT: 4 %
Basophils Absolute: 0.2 10*3/uL — ABNORMAL HIGH (ref 0–0.1)
EOS ABS: 0.3 10*3/uL (ref 0–0.7)
Eosinophils Relative: 8 %
HEMATOCRIT: 52.5 % — AB (ref 40.0–52.0)
HEMOGLOBIN: 17.8 g/dL (ref 13.0–18.0)
LYMPHS ABS: 1.3 10*3/uL (ref 1.0–3.6)
Lymphocytes Relative: 32 %
MCH: 29.9 pg (ref 26.0–34.0)
MCHC: 34 g/dL (ref 32.0–36.0)
MCV: 88.1 fL (ref 80.0–100.0)
Monocytes Absolute: 0.4 10*3/uL (ref 0.2–1.0)
Monocytes Relative: 9 %
Neutro Abs: 1.9 10*3/uL (ref 1.4–6.5)
Neutrophils Relative %: 47 %
Platelets: 149 10*3/uL — ABNORMAL LOW (ref 150–440)
RBC: 5.96 MIL/uL — AB (ref 4.40–5.90)
RDW: 15.4 % — ABNORMAL HIGH (ref 11.5–14.5)
WBC: 4.1 10*3/uL (ref 3.8–10.6)

## 2015-07-30 NOTE — Progress Notes (Signed)
Patient here for follow up and lab work. No concerns today.

## 2015-07-30 NOTE — Progress Notes (Signed)
[place]  Telephone:(336) [phone]  Fax:(336) [place] DOB: 1945/06/28  MR#: [id number]  XLK#:[id number]  Patient Care Team: Leonel Ramsay, MD as PCP - General (Infectious Diseases)  CHIEF COMPLAINT:  Chief Complaint  Patient presents with  . Colon Cancer  . Follow-up    INTERVAL HISTORY: Patient returns to clinic today for 6 month follow up of colon cancer. He feels well today and is asymptomatic. He has no new symptoms since his last visit. He does continue to smoke but he has cut down to under half of what he used to smoke. He has no pain, no fever, bowel changes or blood in stool. He denies weight loss. He does have a port and states it hasn't been flushed for about 4 months. He has no other concerns today.  REVIEW OF SYSTEMS:   Review of Systems  Constitutional: Negative.   HENT: Negative.   Eyes: Negative.   Respiratory: Negative.   Cardiovascular: Negative.   Gastrointestinal: Negative.   Genitourinary: Negative.   Musculoskeletal: Negative.   Skin: Negative.   Neurological: Negative.   Endo/Heme/Allergies: Negative.   Psychiatric/Behavioral: Negative.     As per HPI. Otherwise, a complete review of systems is negatve.  ONCOLOGY HISTORY:  No history exists.    PAST MEDICAL HISTORY: Past Medical History  Diagnosis Date  . Cancer [facility])     Colon cancer 11/2010 with parital resection with chemo and rad tx  . Hypertension   . Neuromuscular disorder (HCC)     WEAKNESS AND NUMBNESS HANDS AND FEET, POSSIBLY FROM CHEMO  . Arthritis     SHOULDERS AND JOINTS  . Dental bridge present     REMOVABLE  . Diabetes mellitus without complication ([place])     TYPE 2, DIET CONTROLLED    PAST SURGICAL HISTORY: Past Surgical History  Procedure Laterality Date  . Colon surgery  NOV 2012    COLON REMOVED  . Colon surgery  APRIL 2014    OSTOMY REMOVED  . Tunneled venous port placement    . Colonoscopy N/A 04/21/2015    Procedure:  COLONOSCOPY;  Surgeon: Hulen Luster, MD;  Location: [place];  Service: Gastroenterology;  Laterality: N/A;  DIABETIC, DIET CONTROLLED  . Polypectomy N/A 04/21/2015    Procedure: POLYPECTOMY;  Surgeon: Hulen Luster, MD;  Location: [place];  Service: Gastroenterology;  Laterality: N/A;    FAMILY HISTORY No family history on file.   ADVANCED DIRECTIVES:    HEALTH MAINTENANCE: Social History  Substance Use Topics  . Smoking status: Current Every Day Smoker -- 0.50 packs/day for 30 years    Types: Cigarettes  . Smokeless tobacco: Never Used  . Alcohol Use: No    No Known Allergies  Current Outpatient Prescriptions  Medication Sig Dispense Refill  . acetaminophen (TYLENOL) 650 MG CR tablet Take 650 mg by mouth every 8 (eight) hours as needed for pain.    Marland Kitchen aspirin 81 MG tablet Take 81 mg by mouth daily. 800AM    . hydrochlorothiazide (HYDRODIURIL) 12.5 MG tablet Take 12.5 mg by mouth daily. AM    . valsartan (DIOVAN) 160 MG tablet Take 320 mg by mouth daily. AM     No current facility-administered medications for this visit.    OBJECTIVE: BP 159/91 mmHg  Pulse 76  Temp(Src) 97.2 F (36.2 C) (Tympanic)  Wt 206 lb 0.3 oz (93.45 kg)   Body mass index is 27.94 kg/(m^2).  ECOG FS:0 - Asymptomatic  General: Well-developed, well-nourished, no acute distress. Eyes: Pink conjunctiva, anicteric sclera. HEENT: Normocephalic, moist mucous membranes, clear oropharnyx. Lungs: Clear to auscultation bilaterally. Heart: Regular rate and rhythm. No rubs, murmurs, or gallops. Abdomen: Soft, nontender, nondistended. No organomegaly noted, normoactive bowel sounds. Musculoskeletal: No edema, cyanosis, or clubbing. Neuro: Alert, answering all questions appropriately. Cranial nerves grossly intact. Skin: No rashes or petechiae noted. Psych: Normal affect. Lymphatics: No cervical, calvicular, axillary LAD.   LAB RESULTS:  Appointment on 07/30/2015  Component Date Value Ref  Range Status  . WBC 07/30/2015 4.1  3.8 - 10.6 K/uL Final  . RBC 07/30/2015 5.96* 4.40 - 5.90 MIL/uL Final  . Hemoglobin 07/30/2015 17.8  13.0 - 18.0 g/dL Final  . HCT 07/30/2015 52.5* 40.0 - 52.0 % Final  . MCV 07/30/2015 88.1  80.0 - 100.0 fL Final  . MCH 07/30/2015 29.9  26.0 - 34.0 pg Final  . MCHC 07/30/2015 34.0  32.0 - 36.0 g/dL Final  . RDW 07/30/2015 15.4* 11.5 - 14.5 % Final  . Platelets 07/30/2015 149* 150 - 440 K/uL Final  . Neutrophils Relative % 07/30/2015 47   Final  . Neutro Abs 07/30/2015 1.9  1.4 - 6.5 K/uL Final  . Lymphocytes Relative 07/30/2015 32   Final  . Lymphs Abs 07/30/2015 1.3  1.0 - 3.6 K/uL Final  . Monocytes Relative 07/30/2015 9   Final  . Monocytes Absolute 07/30/2015 0.4  0.2 - 1.0 K/uL Final  . Eosinophils Relative 07/30/2015 8   Final  . Eosinophils Absolute 07/30/2015 0.3  0 - 0.7 K/uL Final  . Basophils Relative 07/30/2015 4   Final  . Basophils Absolute 07/30/2015 0.2* 0 - 0.1 K/uL Final  . Sodium 07/30/2015 136  135 - 145 mmol/L Final  . Potassium 07/30/2015 3.5  3.5 - 5.1 mmol/L Final  . Chloride 07/30/2015 105  101 - 111 mmol/L Final  . CO2 07/30/2015 26  22 - 32 mmol/L Final  . Glucose, Bld 07/30/2015 101* 65 - 99 mg/dL Final  . BUN 07/30/2015 20  6 - 20 mg/dL Final  . Creatinine, Ser 07/30/2015 1.27* 0.61 - 1.24 mg/dL Final  . Calcium 07/30/2015 8.9  8.9 - 10.3 mg/dL Final  . Total Protein 07/30/2015 7.2  6.5 - 8.1 g/dL Final  . Albumin 07/30/2015 4.1  3.5 - 5.0 g/dL Final  . AST 07/30/2015 19  15 - 41 U/L Final  . ALT 07/30/2015 16* 17 - 63 U/L Final  . Alkaline Phosphatase 07/30/2015 83  38 - 126 U/L Final  . Total Bilirubin 07/30/2015 0.5  0.3 - 1.2 mg/dL Final  . GFR calc non Af Amer 07/30/2015 56* >60 mL/min Final  . GFR calc Af Amer 07/30/2015 >60  >60 mL/min Final   Comment: (NOTE) The eGFR has been calculated using the CKD EPI equation. This calculation has not been validated in all clinical situations. eGFR's persistently <60  mL/min signify possible Chronic Kidney Disease.   . Anion gap 07/30/2015 5  5 - 15 Final    STUDIES: No results found.  ASSESSMENT: Stage IIIc T4 B., N2, MX invasive adenocarcinoma of rectosigmoid colon. 11/27/10: colonscopy: - Malignant partially obstructing tumor in the descending colon. 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 11/29/10: Exploratory laparotomy with sigmoid colectomy and colostomy. K-ras mutated. FOLFOX finished July 15, 2011. 5-FU and Radiation therapy finished October 2013. Underwent colostomy reversal.  PLAN:    1. Colon Cancer- Last CEA in January  2017 was 3.7, it is pending for today. No clinical evidence of recurrence. Follow up in 6 months with repeat labs. He would like to have his appointments in [place].  2. Port- Patient will need port flushed every 6-8 weeks. He will have it flushed tomorrow in [place].  Patient expressed understanding and was in agreement with this plan. He also understands that He can call clinic at any time with any questions, concerns, or complaints.   Dr. Grayland Ormond was available for consultation and review of plan of care for this patient.   Mayra Reel, NP   07/30/2015 12:03 PM

## 2015-07-31 ENCOUNTER — Inpatient Hospital Stay: Payer: Medicare Other

## 2015-07-31 DIAGNOSIS — Z85038 Personal history of other malignant neoplasm of large intestine: Secondary | ICD-10-CM | POA: Diagnosis not present

## 2015-07-31 DIAGNOSIS — C187 Malignant neoplasm of sigmoid colon: Secondary | ICD-10-CM

## 2015-07-31 LAB — CEA: CEA: 3.7 ng/mL (ref 0.0–4.7)

## 2015-07-31 MED ORDER — SODIUM CHLORIDE 0.9% FLUSH
10.0000 mL | INTRAVENOUS | Status: DC | PRN
  Administered 2015-07-31: 10 mL via INTRAVENOUS
  Filled 2015-07-31: qty 10

## 2015-07-31 MED ORDER — HEPARIN SOD (PORK) LOCK FLUSH 100 UNIT/ML IV SOLN: 500.0000 [IU] | Freq: Once | INTRAVENOUS | Status: DC

## 2015-07-31 MED ORDER — HEPARIN SOD (PORK) LOCK FLUSH 100 UNIT/ML IV SOLN
500.0000 [IU] | Freq: Once | INTRAVENOUS | Status: AC
  Administered 2015-07-31: 500 [IU] via INTRAVENOUS
  Filled 2015-07-31: qty 5

## 2015-09-11 ENCOUNTER — Inpatient Hospital Stay: Payer: Medicare Other | Attending: Oncology

## 2015-10-23 ENCOUNTER — Inpatient Hospital Stay: Payer: Medicare Other | Attending: Oncology

## 2015-12-08 ENCOUNTER — Other Ambulatory Visit: Payer: Self-pay | Admitting: Orthopedic Surgery

## 2015-12-08 DIAGNOSIS — M5030 Other cervical disc degeneration, unspecified cervical region: Secondary | ICD-10-CM

## 2015-12-22 ENCOUNTER — Ambulatory Visit
Admission: RE | Admit: 2015-12-22 | Discharge: 2015-12-22 | Disposition: A | Discharge status: Home / Self Care | Payer: Medicare Other | Source: Ambulatory Visit | Attending: Orthopedic Surgery | Admitting: Orthopedic Surgery

## 2015-12-22 DIAGNOSIS — G9589 Other specified diseases of spinal cord: Secondary | ICD-10-CM | POA: Diagnosis not present

## 2015-12-22 DIAGNOSIS — M4312 Spondylolisthesis, cervical region: Secondary | ICD-10-CM | POA: Insufficient documentation

## 2015-12-22 DIAGNOSIS — M4802 Spinal stenosis, cervical region: Secondary | ICD-10-CM | POA: Diagnosis not present

## 2015-12-22 DIAGNOSIS — M5030 Other cervical disc degeneration, unspecified cervical region: Secondary | ICD-10-CM

## 2016-01-29 ENCOUNTER — Other Ambulatory Visit: Payer: Medicare Other

## 2016-01-29 ENCOUNTER — Other Ambulatory Visit: Payer: Self-pay | Admitting: *Deleted

## 2016-01-29 ENCOUNTER — Ambulatory Visit: Payer: Medicare Other | Admitting: Oncology

## 2016-01-29 DIAGNOSIS — C187 Malignant neoplasm of sigmoid colon: Secondary | ICD-10-CM

## 2016-02-02 ENCOUNTER — Ambulatory Visit: Payer: Medicare Other | Admitting: Oncology

## 2016-02-02 ENCOUNTER — Other Ambulatory Visit: Payer: Medicare Other

## 2016-02-04 NOTE — Progress Notes (Signed)
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  INTERVAL HISTORY: Patient returns to clinic today for further evaluation in routine six-month follow-up. He currently feels well and is asymptomatic. He has no neurologic complaints. He denies any recent fevers or illnesses. He has a good appetite and denies weight loss. He has no chest pain or shortness of breath. He denies any nausea, vomiting, constipation, or diarrhea. He is noted no changes in his bowel movements and denies any melena or hematochezia. He has no urinary complaints. Patient feels at his baseline and offers no specific complaints today.   REVIEW OF SYSTEMS:   Review of Systems  Constitutional: Negative.  Negative for fever, malaise/fatigue and weight loss.  Respiratory: Negative.  Negative for cough and shortness of breath.   Cardiovascular: Negative.  Negative for chest pain and leg swelling.  Gastrointestinal: Negative.  Negative for abdominal pain, blood in stool, constipation, diarrhea, melena, nausea and vomiting.  Genitourinary: Negative.   Musculoskeletal: Negative.   Neurological: Negative.  Negative for weakness.  Psychiatric/Behavioral: Negative.  The patient is not nervous/anxious.     As per HPI. Otherwise, a complete review of systems is negative.  ONCOLOGY HISTORY:  No history exists.    PAST MEDICAL HISTORY: Past Medical History:  Diagnosis Date  . Arthritis    SHOULDERS AND JOINTS  . Cancer [facility])    Colon cancer 11/2010 with parital resection with chemo and rad tx  . Dental bridge present    REMOVABLE  . Diabetes mellitus without complication (HCC)    TYPE 2, DIET CONTROLLED  . Hypertension   . Neuromuscular disorder (HCC)    WEAKNESS AND  NUMBNESS HANDS AND FEET, POSSIBLY FROM CHEMO    PAST SURGICAL HISTORY: Past Surgical History:  Procedure Laterality Date  . COLON SURGERY  NOV 2012   COLON REMOVED  . COLON SURGERY  APRIL 2014   OSTOMY REMOVED  . COLONOSCOPY N/A 04/21/2015   Procedure: COLONOSCOPY;  Surgeon: Wallace Cullens, MD;  Location: [facility];  Service: Gastroenterology;  Laterality: N/A;  DIABETIC, DIET CONTROLLED  . POLYPECTOMY N/A 04/21/2015   Procedure: POLYPECTOMY;  Surgeon: Wallace Cullens, MD;  Location: [facility];  Service: Gastroenterology;  Laterality: N/A;  . TUNNELED VENOUS PORT PLACEMENT      FAMILY HISTORY: Reviewed and unchanged. No reported history of malignancy or chronic disease.   ADVANCED DIRECTIVES:    HEALTH MAINTENANCE: Social History  Substance Use Topics  . Smoking status: Current Every Day Smoker    Packs/day: 0.50    Years: 30.00    Types: Cigarettes  . Smokeless tobacco: Never Used  . Alcohol use No    No Known Allergies  Current Outpatient Prescriptions  Medication Sig Dispense Refill  . acetaminophen (TYLENOL) 650 MG CR tablet Take 650 mg by mouth every 8 (eight) hours as needed for pain.    Marland Kitchen aspirin 81 MG tablet Take 81 mg by mouth daily. 800AM    . hydrochlorothiazide (HYDRODIURIL) 12.5 MG tablet Take 12.5 mg by mouth daily.    . valsartan (DIOVAN) 320 MG tablet Take 320 mg by mouth daily.     No current facility-administered medications for this visit.     OBJECTIVE: BP (!) 159/87 (BP Location: Right Arm, Patient Position:  Sitting)   Pulse 80   Temp (!) 96.2 F (35.7 C) (Tympanic)   Resp 18   Wt 212 lb 1.3 oz (96.2 kg)   BMI 28.76 kg/m    Body mass index is 28.76 kg/m.    ECOG FS:0 - Asymptomatic  General: Well-developed, well-nourished, no acute distress. Eyes: Pink conjunctiva, anicteric sclera. HEENT: Normocephalic, moist mucous membranes, clear oropharnyx. Lungs: Clear to auscultation bilaterally. Heart: Regular rate and rhythm. No rubs,  murmurs, or gallops. Abdomen: Soft, nontender, nondistended. No organomegaly noted, normoactive bowel sounds. Musculoskeletal: No edema, cyanosis, or clubbing. Neuro: Alert, answering all questions appropriately. Cranial nerves grossly intact. Skin: No rashes or petechiae noted. Psych: Normal affect. Lymphatics: No cervical, calvicular, axillary LAD.   LAB RESULTS:  Appointment on 02/05/2016  Component Date Value Ref Range Status  . WBC 02/05/2016 4.4  3.8 - 10.6 K/uL Final  . RBC 02/05/2016 5.95* 4.40 - 5.90 MIL/uL Final  . Hemoglobin 02/05/2016 17.3  13.0 - 18.0 g/dL Final  . HCT 50/15/8682 52.7* 40.0 - 52.0 % Final  . MCV 02/05/2016 88.5  80.0 - 100.0 fL Final  . MCH 02/05/2016 29.1  26.0 - 34.0 pg Final  . MCHC 02/05/2016 32.9  32.0 - 36.0 g/dL Final  . RDW 57/49/3552 15.2* 11.5 - 14.5 % Final  . Platelets 02/05/2016 157  150 - 440 K/uL Final  . Neutrophils Relative % 02/05/2016 56  % Final  . Neutro Abs 02/05/2016 2.5  1.4 - 6.5 K/uL Final  . Lymphocytes Relative 02/05/2016 29  % Final  . Lymphs Abs 02/05/2016 1.2  1.0 - 3.6 K/uL Final  . Monocytes Relative 02/05/2016 9  % Final  . Monocytes Absolute 02/05/2016 0.4  0.2 - 1.0 K/uL Final  . Eosinophils Relative 02/05/2016 5  % Final  . Eosinophils Absolute 02/05/2016 0.2  0 - 0.7 K/uL Final  . Basophils Relative 02/05/2016 1  % Final  . Basophils Absolute 02/05/2016 0.0  0 - 0.1 K/uL Final  . Sodium 02/05/2016 136  135 - 145 mmol/L Final  . Potassium 02/05/2016 3.8  3.5 - 5.1 mmol/L Final  . Chloride 02/05/2016 98* 101 - 111 mmol/L Final  . CO2 02/05/2016 32  22 - 32 mmol/L Final  . Glucose, Bld 02/05/2016 126* 65 - 99 mg/dL Final  . BUN 17/47/1595 19  6 - 20 mg/dL Final  . Creatinine, Ser 02/05/2016 1.28* 0.61 - 1.24 mg/dL Final  . Calcium 39/67/2897 9.1  8.9 - 10.3 mg/dL Final  . Total Protein 02/05/2016 7.3  6.5 - 8.1 g/dL Final  . Albumin 91/50/4136 4.0  3.5 - 5.0 g/dL Final  . AST 43/83/7793 19  15 - 41 U/L Final    . ALT 02/05/2016 19  17 - 63 U/L Final  . Alkaline Phosphatase 02/05/2016 78  38 - 126 U/L Final  . Total Bilirubin 02/05/2016 0.4  0.3 - 1.2 mg/dL Final  . GFR calc non Af Amer 02/05/2016 55* >60 mL/min Final  . GFR calc Af Amer 02/05/2016 >60  >60 mL/min Final   Comment: (NOTE) The eGFR has been calculated using the CKD EPI equation. This calculation has not been validated in all clinical situations. eGFR's persistently <60 mL/min signify possible Chronic Kidney Disease.   . Anion gap 02/05/2016 6  5 - 15 Final  . CEA 02/05/2016 4.9* 0.0 - 4.7 ng/mL Final   Comment: (NOTE)       Roche ECLIA methodology       Nonsmokers  <3.9  Smokers     <5.6 Performed At: [facility] [place], [place] [id number] Lindon Romp MD UE:[id number]     STUDIES: No results found.  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   PLAN:    1. 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 He subsequently underwent partial colectomy and adjuvant chemotherapy which was completed on July 15, 2011. He then completed 5-FU and XRT in October 2013. His most recent colonoscopy in 2016 was reported as negative. No evidence of disease. Patient's CEA is mildly elevated, but this has been persistently elevated for several years. Return to clinic in 6 months with repeat laboratory work and further evaluation. At this point, patient will be 5 years removed from completing his treatments and possibly could be discharged from clinic. 2. Hypertension: Patient's blood pressure is mildly elevated today. Continue evaluation and treatment per PCP. 3. Chronic renal insufficiency: Patient's creatinine is mildly elevated. Unclear his baseline. Monitor.   Patient expressed understanding and was in agreement with this plan. He also understands that He can call clinic at any  time with any questions, concerns, or complaints.    Lloyd Huger, MD   02/09/2016 6:16 PM

## 2016-02-05 ENCOUNTER — Inpatient Hospital Stay (HOSPITAL_BASED_OUTPATIENT_CLINIC_OR_DEPARTMENT_OTHER): Payer: Medicare Other | Admitting: Oncology

## 2016-02-05 ENCOUNTER — Inpatient Hospital Stay: Payer: Medicare Other | Attending: Oncology

## 2016-02-05 ENCOUNTER — Inpatient Hospital Stay: Payer: Medicare Other

## 2016-02-05 VITALS — BP 159/87 | HR 80 | Temp 96.2°F | Resp 18 | Wt 212.1 lb

## 2016-02-05 DIAGNOSIS — N189 Chronic kidney disease, unspecified: Secondary | ICD-10-CM | POA: Diagnosis not present

## 2016-02-05 DIAGNOSIS — F17210 Nicotine dependence, cigarettes, uncomplicated: Secondary | ICD-10-CM | POA: Diagnosis not present

## 2016-02-05 DIAGNOSIS — Z7982 Long term (current) use of aspirin: Secondary | ICD-10-CM | POA: Diagnosis not present

## 2016-02-05 DIAGNOSIS — Z79899 Other long term (current) drug therapy: Secondary | ICD-10-CM | POA: Diagnosis not present

## 2016-02-05 DIAGNOSIS — I129 Hypertensive chronic kidney disease with stage 1 through stage 4 chronic kidney disease, or unspecified chronic kidney disease: Secondary | ICD-10-CM

## 2016-02-05 DIAGNOSIS — Z9221 Personal history of antineoplastic chemotherapy: Secondary | ICD-10-CM

## 2016-02-05 DIAGNOSIS — C187 Malignant neoplasm of sigmoid colon: Secondary | ICD-10-CM

## 2016-02-05 DIAGNOSIS — C801 Malignant (primary) neoplasm, unspecified: Secondary | ICD-10-CM

## 2016-02-05 DIAGNOSIS — C19 Malignant neoplasm of rectosigmoid junction: Secondary | ICD-10-CM

## 2016-02-05 DIAGNOSIS — Z452 Encounter for adjustment and management of vascular access device: Secondary | ICD-10-CM | POA: Insufficient documentation

## 2016-02-05 DIAGNOSIS — Z923 Personal history of irradiation: Secondary | ICD-10-CM | POA: Insufficient documentation

## 2016-02-05 LAB — COMPREHENSIVE METABOLIC PANEL
ALK PHOS: 78 U/L (ref 38–126)
ALT: 19 U/L (ref 17–63)
ANION GAP: 6 (ref 5–15)
AST: 19 U/L (ref 15–41)
Albumin: 4 g/dL (ref 3.5–5.0)
BUN: 19 mg/dL (ref 6–20)
CALCIUM: 9.1 mg/dL (ref 8.9–10.3)
CO2: 32 mmol/L (ref 22–32)
Chloride: 98 mmol/L — ABNORMAL LOW (ref 101–111)
Creatinine, Ser: 1.28 mg/dL — ABNORMAL HIGH (ref 0.61–1.24)
GFR calc non Af Amer: 55 mL/min — ABNORMAL LOW (ref >60)
Glucose, Bld: 126 mg/dL — ABNORMAL HIGH (ref 65–99)
Potassium: 3.8 mmol/L (ref 3.5–5.1)
SODIUM: 136 mmol/L (ref 135–145)
TOTAL PROTEIN: 7.3 g/dL (ref 6.5–8.1)
Total Bilirubin: 0.4 mg/dL (ref 0.3–1.2)

## 2016-02-05 LAB — CBC WITH DIFFERENTIAL/PLATELET
Basophils Absolute: 0 10*3/uL (ref 0–0.1)
Basophils Relative: 1 %
EOS ABS: 0.2 10*3/uL (ref 0–0.7)
EOS PCT: 5 %
HCT: 52.7 % — ABNORMAL HIGH (ref 40.0–52.0)
HEMOGLOBIN: 17.3 g/dL (ref 13.0–18.0)
LYMPHS ABS: 1.2 10*3/uL (ref 1.0–3.6)
Lymphocytes Relative: 29 %
MCH: 29.1 pg (ref 26.0–34.0)
MCHC: 32.9 g/dL (ref 32.0–36.0)
MCV: 88.5 fL (ref 80.0–100.0)
MONO ABS: 0.4 10*3/uL (ref 0.2–1.0)
MONOS PCT: 9 %
NEUTROS PCT: 56 %
Neutro Abs: 2.5 10*3/uL (ref 1.4–6.5)
Platelets: 157 10*3/uL (ref 150–440)
RBC: 5.95 MIL/uL — ABNORMAL HIGH (ref 4.40–5.90)
RDW: 15.2 % — ABNORMAL HIGH (ref 11.5–14.5)
WBC: 4.4 10*3/uL (ref 3.8–10.6)

## 2016-02-05 MED ORDER — HEPARIN SOD (PORK) LOCK FLUSH 100 UNIT/ML IV SOLN
500.0000 [IU] | Freq: Once | INTRAVENOUS | Status: AC
  Administered 2016-02-05: 500 [IU] via INTRAVENOUS

## 2016-02-05 MED ORDER — SODIUM CHLORIDE 0.9% FLUSH
10.0000 mL | INTRAVENOUS | Status: DC | PRN
  Administered 2016-02-05: 10 mL via INTRAVENOUS
  Filled 2016-02-05: qty 10

## 2016-02-06 LAB — CEA: CEA: 4.9 ng/mL — AB (ref 0.0–4.7)

## 2016-04-12 ENCOUNTER — Ambulatory Visit: Payer: Medicare Other | Attending: Infectious Diseases | Admitting: Physical Therapy

## 2016-04-12 DIAGNOSIS — M25611 Stiffness of right shoulder, not elsewhere classified: Secondary | ICD-10-CM | POA: Diagnosis present

## 2016-04-12 DIAGNOSIS — M436 Torticollis: Secondary | ICD-10-CM | POA: Diagnosis present

## 2016-04-12 DIAGNOSIS — M542 Cervicalgia: Secondary | ICD-10-CM | POA: Diagnosis present

## 2016-04-12 DIAGNOSIS — M6281 Muscle weakness (generalized): Secondary | ICD-10-CM | POA: Diagnosis present

## 2016-04-12 NOTE — Therapy (Cosign Needed)
[facility] [address]. [place], [place], [address] Phone: [phone]   Fax:  [phone]  Physical Therapy Evaluation  Patient Details  Name: CHRISTIN MCCREEDY MRN: [id number] Date of Birth: Dec 02, 1945 Referring Provider: Adrian Prows MD (primary), Dr. Lovie Macadamia MD (surgeon)  Encounter Date: 04/12/2016      PT End of Session - 04/12/16 1813    Visit Number 1   Number of Visits 8   Date for PT Re-Evaluation 05-18-16   Authorization Type g codes   Authorization Time Period 1/10   PT Start Time 6962   PT Stop Time 1104   PT Time Calculation (min) 50 min   Activity Tolerance Patient tolerated treatment well;Patient limited by pain   Behavior During Therapy [facility] for tasks assessed/performed      Past Medical History:  Diagnosis Date  . Arthritis    SHOULDERS AND JOINTS  . Cancer [facility])    Colon cancer 11/2010 with parital resection with chemo and rad tx  . Dental bridge present    REMOVABLE  . Diabetes mellitus without complication (HCC)    TYPE 2, DIET CONTROLLED  . Hypertension   . Neuromuscular disorder (HCC)    WEAKNESS AND NUMBNESS HANDS AND FEET, POSSIBLY FROM CHEMO    Past Surgical History:  Procedure Laterality Date  . COLON SURGERY  NOV 2012   COLON REMOVED  . COLON SURGERY  APRIL 2014   OSTOMY REMOVED  . COLONOSCOPY N/A 04/21/2015   Procedure: COLONOSCOPY;  Surgeon: Hulen Luster, MD;  Location: [place];  Service: Gastroenterology;  Laterality: N/A;  DIABETIC, DIET CONTROLLED  . POLYPECTOMY N/A 04/21/2015   Procedure: POLYPECTOMY;  Surgeon: Hulen Luster, MD;  Location: [place];  Service: Gastroenterology;  Laterality: N/A;  . TUNNELED VENOUS PORT PLACEMENT      There were no vitals filed for this visit.       Subjective Assessment - 04/12/16 1150    Subjective Patient is a pleasant 71 year old male who presents to his physical therapy evaluation for s/p cervical surgery. Pt. received  ACDF of C3-C7 on 03/14/16 and PSF of C2-4 with PSD C3-7 on 03/15/16 for cervical myelopathy. Admission to [facility] post surgery with therapy of ambulation, balance, and posture. Pt. reports that his doctor told him not to actively move his head yet at this point and that recovery of sensation in hands/arm will be slow. Pt. still having difficulty using R arm and grabbing things like a penny. Pt. has 4/10 pain right now but will have 7/10 pain at worst (usually morning), does not wear collar at night, taking oxycoton as needed (about 1) and heats for pain relief. Pt. has had no falls since surgery.    Pertinent History hx of colon cancer/chemo, cervical radiculopathy, type 2 diabetes, COPD,cervical discecotmy, HTN   Limitations Sitting;Reading;Lifting;Standing;Walking;Writing;House hold activities   How long can you sit comfortably? limited due to cervical fatigue   How long can you stand comfortably? limited due to cervical fatigue and leg weakness   How long can you walk comfortably? limited by LE weakness   Patient Stated Goals want to be able to button shirt and brush hair independently, move neck to return to driving, and pick up objects like pennies    Currently in Pain? Yes   Pain Score 4    Pain Location Neck   Pain Orientation Mid;Lower;Upper   Pain Descriptors / Indicators Aching;Discomfort;Guarding   Pain Type Surgical pain  Pain Radiating Towards upper shoulders   Pain Onset 1 to 4 weeks ago   Pain Frequency Constant   Aggravating Factors  anything that fatigues neck (sitting up, standing, etc) sleeping   Pain Relieving Factors heat, resting head against pillow   Effect of Pain on Daily Activities limiting          AROM-supine  Right Left  Shoulder Flexion 86 138  Shoulder Abduction 74(attempt to compensate into scaption) 101  ER 19 42  IR To belly To belly   PROM-supine  Right Left  Shoulder Flexion 98 157  Shoulder Abduction 96 103  ER 29 46  IR To belly To belly        Right Left  Hip flexion 4-/5 4+/5  Hip Abduction 4-/5 4+/5  Hip Adduction 4-/5 4+/5  Knee Extension  4/5 5/5  Knee Flexion 4-/5 5/5  DF 4-/5 4+/5  PF 4-/5 4+/5   NDI: 40%=Moderate + Froment Sign RUE Able to put tap other fingers to thumb but weak and shaky -Hoffman sign + sensation of RUE for sharp dull test (6/10 wrong) along thenar eminance, thumb, and pointer finger and along lateral forearm.  Gait:  decreased trunk rotation, limited hip extension, and excessive knee flexion upon initial contact. Unable to assess cervical region at this time until protocol is passed along. AP Grade I-III mobilizations to R shoulder hypomobile (hard end feel) PA Grade I-II mobilizations to R shoulder, clunk felt, no pain,  Inferior Grade I-III mobilizations to R shoulder, hypomobile, STS transfer, EOB <>supine transfer.  Tx: Log roll education and demonstration with performance on blue mat, 90 90 standing rows with GTB, standing slow marches with UE support, sit to stand 5x from chair with good body mechanics.  Pt. Response to medical necessity: Pt. Presents with global weakness, limited mobility of shoulders, and decreased functional capacity for functional activities. Will assess cervical further upon delivery of protocol.        PT Education - 04/12/16 1247    Education provided Yes   Education Details HEP, log rolling, heating and icing   Person(s) Educated Patient   Methods Explanation;Demonstration;Handout   Comprehension Verbalized understanding;Returned demonstration             PT Long Term Goals - 04/12/16 1829      PT LONG TERM GOAL #1   Title Patient will decrease NDI score to < 27% for mild percieved disability and improved quality of life.    Baseline 4/3: 40% NDI   Time 4   Period Weeks   Status New     PT LONG TERM GOAL #2   Title Pt. will demonstrate improved functional pincher grasp strength and mechanics to independently button own shirt to return to  activities of daily living.    Baseline Pt. unable to button own shirt at this time, pincher grasp weak.    Time 4   Period Weeks   Status New     PT LONG TERM GOAL #3   Title Pt. will actively move head with no episodes of increased pain to return to functional activities such as driving.    Baseline waiting for protocol prior to moving head.    Time 4   Period Weeks   Status New     PT LONG TERM GOAL #4   Title Pt. will place 10 clothespin on shelf at eyelevel 10x each arm to allow for improved functional range of motion and pincher grasp for return to activities of  daily living.    Baseline Pt. limited in shoulder mobility at this time, difficulty holding objects with pincher grasp.    Time 4   Period Weeks   Status New               Plan - 04/12/16 1814    Clinical Impression Statement Patient is a pleasant 71 year old male who presents to his physical therapy evaluation s/p cervical surgery. ACDF of C3-C7 was performed 03/14/16 and PSF of C2-4 with PSD C3-7 performed on 03/15/16 for cervical myelopathy. Pt. was admitted post op to [facility] and progressed to independent ambulation during his stay. Pt. is currently under restriction for neck mobility and a call was placed to the surgeon for surgical protocol at this time. Generalized weakness of LE's (R>L) and upper extremities (R>L). Sensation impaired on RUE along median nerve sensation pathway (thumb, pointer finger, thenar eminence) with sharp dull testing. R shoulder mobility limited in all directions actively and passively more than Left, hypomobile with hard end feel bilaterally (R less mobile than L).  Gait deviations include decreased trunk rotation, limited hip extension, and excessive knee flexion upon initial contact. Functional capacity for duration of ambulation and standing affected by gross weakness. NDI=40%. Pt. presents with generalized weakness, decreased mobility of UE's, impaired functional capacity for activities  post cervical operation. Patient will benefit from skilled physical therapy to improve functional mobility, decrease pain, improve strength and ROM, and improve community ambulation for improved quality of life and return to activities of daily living.    Rehab Potential Fair   Clinical Impairments Affecting Rehab Potential hx of cancer/chemo, cervical radiculopathy for years prior to surgery.    PT Frequency 2x / week   PT Duration 4 weeks   PT Treatment/Interventions ADLs/Self Care Home Management;Cryotherapy;DME Instruction;Ultrasound;Traction;Electrical Stimulation;Moist Heat;Iontophoresis 4mg /ml Dexamethasone;Gait training;Stair training;Functional mobility training;Therapeutic activities;Therapeutic exercise;Balance training;Patient/family education;Neuromuscular re-education;Manual techniques;Scar mobilization;Taping;Splinting;Energy conservation;Passive range of motion   PT Next Visit Plan review surgical protocol, UE ROM, postural control   PT Home Exercise Plan see pt instructions   Consulted and Agree with Plan of Care Patient      Patient will benefit from skilled therapeutic intervention in order to improve the following deficits and impairments:  Abnormal gait, Decreased activity tolerance, Decreased balance, Decreased endurance, Decreased knowledge of precautions, Decreased mobility, Decreased range of motion, Difficulty walking, Decreased strength, Hypomobility, Impaired flexibility, Impaired perceived functional ability, Impaired sensation, Impaired UE functional use, Postural dysfunction, Improper body mechanics, Pain  Visit Diagnosis: Neck pain  Stiffness of cervical spine  Stiffness of right shoulder, not elsewhere classified  Muscle weakness (generalized)      G-Codes - Apr 30, 2016 0939    Functional Assessment Tool Used (Outpatient Only) NDI, clinical judgement, movement analysis   Functional Limitation Changing and maintaining body position   Changing and Maintaining  Body Position Current Status (Y7062) At least 40 percent but less than 60 percent impaired, limited or restricted   Changing and Maintaining Body Position Goal Status (B7628) At least 1 percent but less than 20 percent impaired, limited or restricted       Problem List Patient Active Problem List   Diagnosis Date Noted  . Malignant neoplasm of sigmoid colon ([place]) 07/30/2015  . Chronic obstructive pulmonary disease ([place]) 11/01/2014  . H/O disease 11/01/2014  . BP (high blood pressure) 11/01/2014  . Type 2 diabetes mellitus ([place]) 11/01/2014  . Current tobacco use 10/06/2014  . H/O malignant neoplasm of colon 11/11/2010    Janna Arch, SPT  This entire session was performed under direct supervision and direction of a licensed therapist/therapist assistant . I have personally read, edited and approve of the note as written. Collie Siad PT, DPT 04/13/2016, 9:41 AM  [facility] [address] [place], [place], [address] Phone: [phone]   Fax:  [phone]  Name: DALLYN BERGLAND MRN: [id number] Date of Birth: [birth year]

## 2016-04-13 ENCOUNTER — Encounter: Payer: Self-pay | Admitting: Physical Therapy

## 2016-04-14 ENCOUNTER — Ambulatory Visit: Payer: Medicare Other | Admitting: Physical Therapy

## 2016-04-14 ENCOUNTER — Encounter: Payer: Self-pay | Admitting: Physical Therapy

## 2016-04-14 DIAGNOSIS — M542 Cervicalgia: Secondary | ICD-10-CM

## 2016-04-14 DIAGNOSIS — M25611 Stiffness of right shoulder, not elsewhere classified: Secondary | ICD-10-CM

## 2016-04-14 DIAGNOSIS — M6281 Muscle weakness (generalized): Secondary | ICD-10-CM

## 2016-04-14 DIAGNOSIS — M436 Torticollis: Secondary | ICD-10-CM

## 2016-04-14 NOTE — Therapy (Signed)
[facility] [address] [place], [place], [address] Phone: [phone]   Fax:  [phone]  Physical Therapy Treatment  Patient Details  Name: Cameron Howe MRN: [id number] Date of Birth: 1945-12-01 Referring Provider: Adrian Prows MD (primary), Dr. Lovie Macadamia MD (surgeon)  Encounter Date: 04/14/2016      PT End of Session - 04/14/16 1010    Visit Number 2   Number of Visits 8   Date for PT Re-Evaluation 05/22/2016   Authorization Type g codes   Authorization Time Period 2/10   PT Start Time 0858   PT Stop Time 0946   PT Time Calculation (min) 48 min   Activity Tolerance Patient tolerated treatment well;Patient limited by pain   Behavior During Therapy [facility] for tasks assessed/performed      Past Medical History:  Diagnosis Date  . Arthritis    SHOULDERS AND JOINTS  . Cancer [facility])    Colon cancer 11/2010 with parital resection with chemo and rad tx  . Dental bridge present    REMOVABLE  . Diabetes mellitus without complication (HCC)    TYPE 2, DIET CONTROLLED  . Hypertension   . Neuromuscular disorder (HCC)    WEAKNESS AND NUMBNESS HANDS AND FEET, POSSIBLY FROM CHEMO    Past Surgical History:  Procedure Laterality Date  . COLON SURGERY  NOV 2012   COLON REMOVED  . COLON SURGERY  APRIL 2014   OSTOMY REMOVED  . COLONOSCOPY N/A 04/21/2015   Procedure: COLONOSCOPY;  Surgeon: Hulen Luster, MD;  Location: [place];  Service: Gastroenterology;  Laterality: N/A;  DIABETIC, DIET CONTROLLED  . POLYPECTOMY N/A 04/21/2015   Procedure: POLYPECTOMY;  Surgeon: Hulen Luster, MD;  Location: [place];  Service: Gastroenterology;  Laterality: N/A;  . TUNNELED VENOUS PORT PLACEMENT      There were no vitals filed for this visit.      Subjective Assessment - 04/14/16 0858    Subjective Prior to session call was made to Dr. Rhea Bleacher office who reports they are unable to provide protocol until next week at pt's  appointment.  Pt reports he completed his HEP yesterday and denies having any questions or concerns at this time.  He has an appointment with his Neurosurgeon (Dr. Izora Ribas) next Thursday 4/12.  He reports some aching in his posterior L cervical region in the mornings.   Pertinent History hx of colon cancer/chemo, cervical radiculopathy, type 2 diabetes, COPD,cervical discecotmy, HTN   Limitations Sitting;Reading;Lifting;Standing;Walking;Writing;House hold activities   How long can you sit comfortably? limited due to cervical fatigue   How long can you stand comfortably? limited due to cervical fatigue and leg weakness   How long can you walk comfortably? limited by LE weakness   Patient Stated Goals want to be able to button shirt and brush hair independently, move neck to return to driving, and pick up objects like pennies    Currently in Pain? Yes   Pain Score 5    Pain Location Neck   Pain Orientation Left;Posterior   Pain Descriptors / Indicators Constant;Aching   Pain Type Chronic pain   Pain Onset More than a month ago   Pain Frequency Constant   Multiple Pain Sites No        TREATMENT   Manual Therapy:  PROM R shoulder into F, Abd, and ER with 10 second holds at end range. x10 each direction. Pt denies cervical pain with this. Reports mild stretch/mild pain in R  shoulder at end range in each direction.   Grade IV R shoulder AP and Inferior mobilizations 5x30 seconds each direction. Pt denies any pain with this.   STM L UT as increased muscular tightness appreciated and pt reported L cervical aching pain in the mornings.    Therapeutic Exercise:  Reviewed log roll for supine<>sit x1, pt demonstrated proper technique with no cues needed. Reviewed proper height of pillows when sleeping to place cervical spine in neutral alignment as pt has been reporting L posterior cervical pain when waking and he sleeps mainly in R sidelying.   Chin tucks in supine with demonstration,  verbal, and tactile cues for proper technique. X10 with 5 second holds. Pt denies cervical pain with this.   Standing R elbow flexion with YTB 2x15 with cues for upright posture and scapular squeeze throughout   Standing R shoulder ER with towel roll and YTB 2x15. Pt initially reporting mild posterior neck pain but this pain was eliminated with cues for upright posture and scapular squeeze throughout.    AROM in deg in supine following R shoulder PROM and mobilizations    Right   Shoulder Flexion  102   Shoulder Abduction  72   ER  18                PT Education - 04/14/16 1009    Education provided Yes   Education Details Exercise technique, posture, clinical reasoning behind manual therapy   Person(s) Educated Patient   Methods Explanation;Demonstration;Verbal cues   Comprehension Verbalized understanding;Returned demonstration;Need further instruction;Verbal cues required;Tactile cues required             PT Long Term Goals - 04/12/16 1829      PT LONG TERM GOAL #1   Title Patient will decrease NDI score to < 27% for mild percieved disability and improved quality of life.    Baseline 4/3: 40% NDI   Time 4   Period Weeks   Status New     PT LONG TERM GOAL #2   Title Pt. will demonstrate improved functional pincher grasp strength and mechanics to independently button own shirt to return to activities of daily living.    Baseline Pt. unable to button own shirt at this time, pincher grasp weak.    Time 4   Period Weeks   Status New     PT LONG TERM GOAL #3   Title Pt. will actively move head with no episodes of increased pain to return to functional activities such as driving.    Baseline waiting for protocol prior to moving head.    Time 4   Period Weeks   Status New     PT LONG TERM GOAL #4   Title Pt. will place 10 clothespin on shelf at eyelevel 10x each arm to allow for improved functional range of motion and pincher grasp for return to activities of  daily living.    Baseline Pt. limited in shoulder mobility at this time, difficulty holding objects with pincher grasp.    Time 4   Period Weeks   Status New               Plan - 04/14/16 1010    Clinical Impression Statement Was told by nurse at Dr. Rhea Bleacher office that the pt's protocol will not be available until pt's appointment with them on 4/12.  Therefore, interventions were limited to R shoulder, posture, and gentle DNF strengthening until procol is received.  Pt tolerated all  interventions today without cervical pain (ER exercise adjusted for no pain).  He responded positively to PROM and mobilization to R shoulder with a significant increase in R shoulder F AROM to 102 deg compared to 86 deg at last session.  He was able to properly demonstrate DNF exercise in supine.  He does require cues for proper posture during strengthening exercises as pt has tendency to demonstrate rounded shoulders in standing.  Pt will benefit from continued skilled PT interventions for improved ROM, strength, posture, and improved QOL.     Rehab Potential Fair   Clinical Impairments Affecting Rehab Potential hx of cancer/chemo, cervical radiculopathy for years prior to surgery.    PT Frequency 2x / week   PT Duration 4 weeks   PT Treatment/Interventions ADLs/Self Care Home Management;Cryotherapy;DME Instruction;Ultrasound;Traction;Electrical Stimulation;Moist Heat;Iontophoresis 4mg /ml Dexamethasone;Gait training;Stair training;Functional mobility training;Therapeutic activities;Therapeutic exercise;Balance training;Patient/family education;Neuromuscular re-education;Manual techniques;Scar mobilization;Taping;Splinting;Energy conservation;Passive range of motion   PT Next Visit Plan review surgical protocol if available, UE ROM and mobilizations, postural control, RUE strengthening   PT Home Exercise Plan see pt instructions   Consulted and Agree with Plan of Care Patient      Patient will benefit  from skilled therapeutic intervention in order to improve the following deficits and impairments:  Abnormal gait, Decreased activity tolerance, Decreased balance, Decreased endurance, Decreased knowledge of precautions, Decreased mobility, Decreased range of motion, Difficulty walking, Decreased strength, Hypomobility, Impaired flexibility, Impaired perceived functional ability, Impaired sensation, Impaired UE functional use, Postural dysfunction, Improper body mechanics, Pain  Visit Diagnosis: Neck pain  Stiffness of cervical spine  Stiffness of right shoulder, not elsewhere classified  Muscle weakness (generalized)       G-Codes - April 23, 2016 0939    Functional Assessment Tool Used (Outpatient Only) NDI, clinical judgement, movement analysis   Functional Limitation Changing and maintaining body position   Changing and Maintaining Body Position Current Status (T2671) At least 40 percent but less than 60 percent impaired, limited or restricted   Changing and Maintaining Body Position Goal Status (I4580) At least 1 percent but less than 20 percent impaired, limited or restricted      Problem List Patient Active Problem List   Diagnosis Date Noted  . Malignant neoplasm of sigmoid colon ([place]) 07/30/2015  . Chronic obstructive pulmonary disease ([place]) 11/01/2014  . H/O disease 11/01/2014  . BP (high blood pressure) 11/01/2014  . Type 2 diabetes mellitus ([place]) 11/01/2014  . Current tobacco use 10/06/2014  . H/O malignant neoplasm of colon 11/11/2010    Collie Siad PT, DPT 04/14/2016, 10:17 AM  [facility] [address] [place], [place], [address] Phone: [phone]   Fax:  [phone]  Name: Cameron Howe MRN: [id number] Date of Birth: [birth year]

## 2016-04-19 ENCOUNTER — Ambulatory Visit: Payer: Medicare Other | Admitting: Physical Therapy

## 2016-04-19 ENCOUNTER — Encounter: Payer: Self-pay | Admitting: Physical Therapy

## 2016-04-19 DIAGNOSIS — M542 Cervicalgia: Secondary | ICD-10-CM | POA: Diagnosis not present

## 2016-04-19 DIAGNOSIS — M6281 Muscle weakness (generalized): Secondary | ICD-10-CM

## 2016-04-19 DIAGNOSIS — M436 Torticollis: Secondary | ICD-10-CM

## 2016-04-19 DIAGNOSIS — M25611 Stiffness of right shoulder, not elsewhere classified: Secondary | ICD-10-CM

## 2016-04-19 NOTE — Therapy (Signed)
[facility] [address] [place], [place], [address] Phone: [phone]   Fax:  [phone]  Physical Therapy Treatment  Patient Details  Name: Cameron Howe MRN: [id number] Date of Birth: May 25, 1945 Referring Provider: Adrian Prows MD (primary), Dr. Lovie Macadamia MD (surgeon)  Encounter Date: 04/19/2016      PT End of Session - 04/19/16 1122    Visit Number 3   Number of Visits 8   Date for PT Re-Evaluation 05-15-16   Authorization Type g codes   Authorization Time Period 3/10   PT Start Time 0857   PT Stop Time 0946   PT Time Calculation (min) 49 min   Activity Tolerance Patient tolerated treatment well;Patient limited by pain   Behavior During Therapy [facility] for tasks assessed/performed      Past Medical History:  Diagnosis Date  . Arthritis    SHOULDERS AND JOINTS  . Cancer [facility])    Colon cancer 11/2010 with parital resection with chemo and rad tx  . Dental bridge present    REMOVABLE  . Diabetes mellitus without complication (HCC)    TYPE 2, DIET CONTROLLED  . Hypertension   . Neuromuscular disorder (HCC)    WEAKNESS AND NUMBNESS HANDS AND FEET, POSSIBLY FROM CHEMO    Past Surgical History:  Procedure Laterality Date  . COLON SURGERY  NOV 2012   COLON REMOVED  . COLON SURGERY  APRIL 2014   OSTOMY REMOVED  . COLONOSCOPY N/A 04/21/2015   Procedure: COLONOSCOPY;  Surgeon: Hulen Luster, MD;  Location: [place];  Service: Gastroenterology;  Laterality: N/A;  DIABETIC, DIET CONTROLLED  . POLYPECTOMY N/A 04/21/2015   Procedure: POLYPECTOMY;  Surgeon: Hulen Luster, MD;  Location: [place];  Service: Gastroenterology;  Laterality: N/A;  . TUNNELED VENOUS PORT PLACEMENT      There were no vitals filed for this visit.      Subjective Assessment - 04/19/16 1120    Subjective Patient has been compliant with HEP and continues to wait to hear back for protocol which will be given at appointment with  surgeon Thursday. He has some aching of posterior L cervical region today.    Pertinent History hx of colon cancer/chemo, cervical radiculopathy, type 2 diabetes, COPD,cervical discecotmy, HTN   Limitations Sitting;Reading;Lifting;Standing;Walking;Writing;House hold activities   How long can you sit comfortably? limited due to cervical fatigue   How long can you stand comfortably? limited due to cervical fatigue and leg weakness   How long can you walk comfortably? limited by LE weakness   Patient Stated Goals want to be able to button shirt and brush hair independently, move neck to return to driving, and pick up objects like pennies    Currently in Pain? Yes   Pain Score 5    Pain Location Neck   Pain Orientation Posterior;Left   Pain Onset More than a month ago     Manual Grade I=IV shoulder AP, inferior, and PA mobilizations 6x30 seconds each direction. Hypomobile PROM R shoulder into F, Abd, and ER with 10 second holds at end range. x10 each direction. Pt denies cervical pain with this. Reports mild stretch/mild pain in R shoulder at end range in each direction  TherEx Supine AAROM flexion, abd, ER with dowel 10x each direction Standing ER with GTB 10x with tactile cues for body mechanics Bicep curls 1lb, cues for upright posture.  Step forward and backwards with upright posture 10x each leg   Pt. Response to  medical necessity: Pt. Presents with global weakness, limited mobility of shoulders, and decreased functional capacity for functional activities. Will assess cervical further upon delivery of protocol.       PT Education - 04/19/16 1121    Education provided Yes   Education Details body mechanics with ROM techniques    Person(s) Educated Patient   Methods Explanation;Demonstration   Comprehension Verbalized understanding;Returned demonstration             PT Long Term Goals - 04/12/16 1829      PT LONG TERM GOAL #1   Title Patient will decrease NDI score to <  27% for mild percieved disability and improved quality of life.    Baseline 4/3: 40% NDI   Time 4   Period Weeks   Status New     PT LONG TERM GOAL #2   Title Pt. will demonstrate improved functional pincher grasp strength and mechanics to independently button own shirt to return to activities of daily living.    Baseline Pt. unable to button own shirt at this time, pincher grasp weak.    Time 4   Period Weeks   Status New     PT LONG TERM GOAL #3   Title Pt. will actively move head with no episodes of increased pain to return to functional activities such as driving.    Baseline waiting for protocol prior to moving head.    Time 4   Period Weeks   Status New     PT LONG TERM GOAL #4   Title Pt. will place 10 clothespin on shelf at eyelevel 10x each arm to allow for improved functional range of motion and pincher grasp for return to activities of daily living.    Baseline Pt. limited in shoulder mobility at this time, difficulty holding objects with pincher grasp.    Time 4   Period Weeks   Status New               Plan - 04/19/16 1125    Clinical Impression Statement Pt. treatment limited to R shoulder, posture, and gross strengthening until protocol for cervical region is received. Pt. tolerated treatment well with improved ROM tolerance. Hypomobility of R shoulder continues to limit pt. ability to perform ADL's such as combing hair and mobilizations, PROM, and AAROM implemented for improved motion. Generalized strengthening and postural cues given throughout session due to tendency to hunch and round shoulders. Patient will continue to benefit from skilled physical therapy for improved ROM, strength, posture, and improved QOL.    Rehab Potential Fair   Clinical Impairments Affecting Rehab Potential hx of cancer/chemo, cervical radiculopathy for years prior to surgery.    PT Frequency 2x / week   PT Duration 4 weeks   PT Treatment/Interventions ADLs/Self Care Home  Management;Cryotherapy;DME Instruction;Ultrasound;Traction;Electrical Stimulation;Moist Heat;Iontophoresis 4mg /ml Dexamethasone;Gait training;Stair training;Functional mobility training;Therapeutic activities;Therapeutic exercise;Balance training;Patient/family education;Neuromuscular re-education;Manual techniques;Scar mobilization;Taping;Splinting;Energy conservation;Passive range of motion   PT Next Visit Plan review surgical protocol if available, UE ROM and mobilizations, postural control, RUE strengthening   PT Home Exercise Plan see pt instructions   Consulted and Agree with Plan of Care Patient      Patient will benefit from skilled therapeutic intervention in order to improve the following deficits and impairments:  Abnormal gait, Decreased activity tolerance, Decreased balance, Decreased endurance, Decreased knowledge of precautions, Decreased mobility, Decreased range of motion, Difficulty walking, Decreased strength, Hypomobility, Impaired flexibility, Impaired perceived functional ability, Impaired sensation, Impaired UE functional use, Postural dysfunction, Improper  body mechanics, Pain  Visit Diagnosis: Neck pain  Stiffness of cervical spine  Stiffness of right shoulder, not elsewhere classified  Muscle weakness (generalized)     Problem List Patient Active Problem List   Diagnosis Date Noted  . Malignant neoplasm of sigmoid colon ([place]) 07/30/2015  . Chronic obstructive pulmonary disease ([place]) 11/01/2014  . H/O disease 11/01/2014  . BP (high blood pressure) 11/01/2014  . Type 2 diabetes mellitus ([place]) 11/01/2014  . Current tobacco use 10/06/2014  . H/O malignant neoplasm of colon 11/11/2010   Pura Spice, PT, DPT # [id number] Janna Arch, SPT 04/19/2016, 3:47 PM  [place] [address] [place], [place], [address] Phone: [phone]   Fax:  [phone]  Name: Cameron Howe MRN: [id number] Date of Birth:  1945-06-17

## 2016-04-21 ENCOUNTER — Ambulatory Visit: Payer: Medicare Other | Admitting: Physical Therapy

## 2016-04-21 DIAGNOSIS — M542 Cervicalgia: Secondary | ICD-10-CM | POA: Diagnosis not present

## 2016-04-21 DIAGNOSIS — M436 Torticollis: Secondary | ICD-10-CM

## 2016-04-21 DIAGNOSIS — M6281 Muscle weakness (generalized): Secondary | ICD-10-CM

## 2016-04-21 DIAGNOSIS — M25611 Stiffness of right shoulder, not elsewhere classified: Secondary | ICD-10-CM

## 2016-04-21 NOTE — Therapy (Signed)
[facility] [address]. [place], [place], [address] Phone: [phone]   Fax:  [phone]  Physical Therapy Treatment  Patient Details  Name: Cameron Howe MRN: [id number] Date of Birth: 1945-01-20 Referring Provider: Adrian Prows MD (primary), Dr. Lovie Macadamia MD (surgeon)  Encounter Date: 04/21/2016      PT End of Session - 04/21/16 1440    Visit Number 4   Number of Visits 8   Date for PT Re-Evaluation 05-25-16   Authorization Type g codes   Authorization Time Period 4/10   PT Start Time 9373   PT Stop Time 1428   PT Time Calculation (min) 51 min   Activity Tolerance Patient tolerated treatment well;Patient limited by pain   Behavior During Therapy [facility] for tasks assessed/performed      Past Medical History:  Diagnosis Date  . Arthritis    SHOULDERS AND JOINTS  . Cancer [facility])    Colon cancer 11/2010 with parital resection with chemo and rad tx  . Dental bridge present    REMOVABLE  . Diabetes mellitus without complication (HCC)    TYPE 2, DIET CONTROLLED  . Hypertension   . Neuromuscular disorder (HCC)    WEAKNESS AND NUMBNESS HANDS AND FEET, POSSIBLY FROM CHEMO    Past Surgical History:  Procedure Laterality Date  . COLON SURGERY  NOV 2012   COLON REMOVED  . COLON SURGERY  APRIL 2014   OSTOMY REMOVED  . COLONOSCOPY N/A 04/21/2015   Procedure: COLONOSCOPY;  Surgeon: Hulen Luster, MD;  Location: [place];  Service: Gastroenterology;  Laterality: N/A;  DIABETIC, DIET CONTROLLED  . POLYPECTOMY N/A 04/21/2015   Procedure: POLYPECTOMY;  Surgeon: Hulen Luster, MD;  Location: [place];  Service: Gastroenterology;  Laterality: N/A;  . TUNNELED VENOUS PORT PLACEMENT      There were no vitals filed for this visit.      Subjective Assessment - 04/21/16 1404    Subjective Met with surgeon prior to therapy today. Surgeon says cervical restrictions are to tolerance, avoid pain provoking, no lifting  over 10lb until April 16th, and then 25lbs until May 31st, with unlimited based on comfort afterwards. Surgeon wants continued  UE strength.    Pertinent History hx of colon cancer/chemo, cervical radiculopathy, type 2 diabetes, COPD,cervical discecotmy, HTN   Limitations Sitting;Reading;Lifting;Standing;Walking;Writing;House hold activities   How long can you sit comfortably? limited due to cervical fatigue   How long can you stand comfortably? limited due to cervical fatigue and leg weakness   How long can you walk comfortably? limited by LE weakness   Patient Stated Goals want to be able to button shirt and brush hair independently, move neck to return to driving, and pick up objects like pennies    Currently in Pain? Yes   Pain Score 3    Pain Location Neck   Pain Orientation Posterior   Pain Onset More than a month ago    (L SB: 19 deg, R SB: 26 deg, Flex 18 deg, Ext 15 deg, L rot 29 deg, R rot 29 degrees with compensatory pattern MGrade I=IV shoulder AP, inferior, and PA mobilizations 6x30 seconds each direction. Hypomobileanual  TherEx AROM with PT support in supine 8x each direction (flex, extend, side bend, rotate)with 10 second holds at end range   Supine AAROM flexion with dowel 10x with 10 second holds Standing ER with GTB 10x with tactile cues for body mechanics Standing rows with GTB 10x with 90  90, 10x straight arm Nustep 6 minutes Lvl4    Pt. Response to medical necessity: Pt. Presents with global weakness, limited mobility of neck and shoulders, and decreased functional capacity for functional activities.         PT Education - 04/21/16 1440    Education provided Yes   Education Details cervical ROM HEP to add to strengthening HEP   Person(s) Educated Patient   Methods Explanation;Demonstration;Handout   Comprehension Verbalized understanding;Returned demonstration             PT Long Term Goals - 04/21/16 1826      PT LONG TERM GOAL #1   Title Patient  will decrease NDI score to < 27% for mild percieved disability and improved quality of life.    Baseline 4/3: 40% NDI   Time 4   Period Weeks   Status New     PT LONG TERM GOAL #2   Title Pt. will demonstrate improved functional pincher grasp strength and mechanics to independently button own shirt to return to activities of daily living.    Baseline Pt. unable to button own shirt at this time, pincher grasp weak.    Time 4   Period Weeks   Status New     PT LONG TERM GOAL #3   Title Pt. will actively move head with no episodes of increased pain to return to functional activities such as driving.    Baseline waiting for protocol prior to moving head.    Time 4   Period Weeks   Status New     PT LONG TERM GOAL #4   Title Pt. will place 10 clothespin on shelf at eyelevel 10x each arm to allow for improved functional range of motion and pincher grasp for return to activities of daily living.    Baseline Pt. limited in shoulder mobility at this time, difficulty holding objects with pincher grasp.    Time 4   Period Weeks   Status New     PT LONG TERM GOAL #5   Title Pt. will demonstrate improved cervical AROM to Flex >30, Extension>35, Rotation>40, Side bending > 20 degrees   Baseline (L SB: 19 deg, R SB: 26 deg, Flex 18 deg, Ext 15 deg, L rot 29 deg, R rot 29 degrees with compensatory pattern   Time 4   Period Weeks   Status New               Plan - 04/21/16 1825    Clinical Impression Statement Patient's surgeon has allowed for initiation of cervical therapy with pain being the limit.  Initiation of cervical AROM in supine with PT supporting head was performed today with 10 second holds at each end range and prescribed for HEP. Cervical AROM is as followed (L SB: 19 deg, R SB: 26 deg, Flex 18 deg, Ext 15 deg, L rot 29 deg, R rot 29 degrees with compensatory pattern).  R shoulder hypomobility improved with Grade I-IV mobilizations. Functional strengthening and postural  activities performed with cues for body mechanics. Patient will continue to benefit from skilled physical therapy to improve ROM, strength, posture, and improved quality of life.    Rehab Potential Fair   Clinical Impairments Affecting Rehab Potential hx of cancer/chemo, cervical radiculopathy for years prior to surgery.    PT Frequency 2x / week   PT Duration 4 weeks   PT Treatment/Interventions ADLs/Self Care Home Management;Cryotherapy;DME Instruction;Ultrasound;Traction;Electrical Stimulation;Moist Heat;Iontophoresis '4mg'$ /ml Dexamethasone;Gait training;Stair training;Functional mobility training;Therapeutic activities;Therapeutic exercise;Balance training;Patient/family education;Neuromuscular  re-education;Manual techniques;Scar mobilization;Taping;Splinting;Energy conservation;Passive range of motion   PT Next Visit Plan cervical AROM, AAROM, UE strengthening.    PT Home Exercise Plan see pt instructions   Consulted and Agree with Plan of Care Patient      Patient will benefit from skilled therapeutic intervention in order to improve the following deficits and impairments:  Abnormal gait, Decreased activity tolerance, Decreased balance, Decreased endurance, Decreased knowledge of precautions, Decreased mobility, Decreased range of motion, Difficulty walking, Decreased strength, Hypomobility, Impaired flexibility, Impaired perceived functional ability, Impaired sensation, Impaired UE functional use, Postural dysfunction, Improper body mechanics, Pain  Visit Diagnosis: Neck pain  Stiffness of cervical spine  Stiffness of right shoulder, not elsewhere classified  Muscle weakness (generalized)     Problem List Patient Active Problem List   Diagnosis Date Noted  . Malignant neoplasm of sigmoid colon ([place]) 07/30/2015  . Chronic obstructive pulmonary disease ([place]) 11/01/2014  . H/O disease 11/01/2014  . BP (high blood pressure) 11/01/2014  . Type 2 diabetes mellitus ([place]) 11/01/2014  .  Current tobacco use 10/06/2014  . H/O malignant neoplasm of colon 11/11/2010   This entire session was performed under direct supervision and direction of a licensed therapist/therapist assistant. I have personally read, edited and approve of the note as written.  Pura Spice, PT, DPT # [id number] Janna Arch, SPT 04/22/2016, 2:16 PM  [facility] [address] [place], [place], [address] Phone: [phone]   Fax:  [phone]  Name: Cameron Howe MRN: [id number] Date of Birth: Oct 23, 1945

## 2016-04-26 ENCOUNTER — Encounter: Payer: Self-pay | Admitting: Physical Therapy

## 2016-04-26 ENCOUNTER — Ambulatory Visit: Payer: Medicare Other | Admitting: Physical Therapy

## 2016-04-26 DIAGNOSIS — M436 Torticollis: Secondary | ICD-10-CM

## 2016-04-26 DIAGNOSIS — M542 Cervicalgia: Secondary | ICD-10-CM

## 2016-04-26 DIAGNOSIS — M25611 Stiffness of right shoulder, not elsewhere classified: Secondary | ICD-10-CM

## 2016-04-26 DIAGNOSIS — M6281 Muscle weakness (generalized): Secondary | ICD-10-CM

## 2016-04-26 NOTE — Therapy (Signed)
[place] [facility] [address] [place], [place], [address] Phone: [phone]   Fax:  [phone]  Physical Therapy Treatment  Patient Details  Name: Cameron Howe MRN: [id number] Date of Birth: 03/29/1945 Referring Provider: Adrian Prows MD (primary), Dr. Lovie Macadamia MD (surgeon)  Encounter Date: 04/26/2016      PT End of Session - 04/26/16 1029    Visit Number 5   Number of Visits 8   Date for PT Re-Evaluation 2016-06-01   Authorization Type g codes   Authorization Time Period 4/10   PT Start Time 0927   PT Stop Time 1016   PT Time Calculation (min) 49 min   Activity Tolerance Patient tolerated treatment well;Patient limited by pain   Behavior During Therapy [facility] for tasks assessed/performed      Past Medical History:  Diagnosis Date  . Arthritis    SHOULDERS AND JOINTS  . Cancer [facility])    Colon cancer 11/2010 with parital resection with chemo and rad tx  . Dental bridge present    REMOVABLE  . Diabetes mellitus without complication (HCC)    TYPE 2, DIET CONTROLLED  . Hypertension   . Neuromuscular disorder (HCC)    WEAKNESS AND NUMBNESS HANDS AND FEET, POSSIBLY FROM CHEMO    Past Surgical History:  Procedure Laterality Date  . COLON SURGERY  NOV 2012   COLON REMOVED  . COLON SURGERY  APRIL 2014   OSTOMY REMOVED  . COLONOSCOPY N/A 04/21/2015   Procedure: COLONOSCOPY;  Surgeon: Hulen Luster, MD;  Location: [place];  Service: Gastroenterology;  Laterality: N/A;  DIABETIC, DIET CONTROLLED  . POLYPECTOMY N/A 04/21/2015   Procedure: POLYPECTOMY;  Surgeon: Hulen Luster, MD;  Location: [place];  Service: Gastroenterology;  Laterality: N/A;  . TUNNELED VENOUS PORT PLACEMENT      There were no vitals filed for this visit.      Subjective Assessment - 04/26/16 0925    Subjective Patient is a little sore in his neck due to recent increase in mobility but is feeling good overall and eager to continue  progressing in therapy.    Pertinent History hx of colon cancer/chemo, cervical radiculopathy, type 2 diabetes, COPD,cervical discecotmy, HTN   Limitations Sitting;Reading;Lifting;Standing;Walking;Writing;House hold activities   How long can you sit comfortably? limited due to cervical fatigue   How long can you stand comfortably? limited due to cervical fatigue and leg weakness   How long can you walk comfortably? limited by LE weakness   Patient Stated Goals want to be able to button shirt and brush hair independently, move neck to return to driving, and pick up objects like pennies    Currently in Pain? Yes   Pain Score 5    Pain Location Neck   Pain Orientation Posterior   Pain Descriptors / Indicators Constant;Aching   Pain Onset More than a month ago     Manual Cervical AAROM in supine and seated with PT applied gentle overpressure and guidance, 10x each direction with 10-15 second holds STM upper trap and cervical musculature Grade I-IV AP, PA, Inferior mobilizations of R shoulder, pain with inferior PROM R shoulder flex, abd, ER  TherEx Reaching and placing 10 cones on overhead surface 4x. Reaching//grabbing cone from overhead surface and adducting/IR arm to place in PT's hands. Body mechanics when lifting 10lb box, and 20lb box pick up 4x carry and walk 5ft or each weight. Sit to stand no UE assistance 8x Scapular retractions 15x  Pt. Response to medical necessity: Pt. Presents with global weakness, limited mobility of neck and shoulders, and decreased functional capacity for functional activities.       PT Education - 04/26/16 1029    Education provided Yes   Education Details Customer service manager) Educated Patient   Methods Explanation;Demonstration   Comprehension Verbalized understanding;Returned demonstration             PT Long Term Goals - 04/21/16 1826      PT LONG TERM GOAL #1   Title Patient will decrease NDI score to < 27% for mild  percieved disability and improved quality of life.    Baseline 4/3: 40% NDI   Time 4   Period Weeks   Status New     PT LONG TERM GOAL #2   Title Pt. will demonstrate improved functional pincher grasp strength and mechanics to independently button own shirt to return to activities of daily living.    Baseline Pt. unable to button own shirt at this time, pincher grasp weak.    Time 4   Period Weeks   Status New     PT LONG TERM GOAL #3   Title Pt. will actively move head with no episodes of increased pain to return to functional activities such as driving.    Baseline waiting for protocol prior to moving head.    Time 4   Period Weeks   Status New     PT LONG TERM GOAL #4   Title Pt. will place 10 clothespin on shelf at eyelevel 10x each arm to allow for improved functional range of motion and pincher grasp for return to activities of daily living.    Baseline Pt. limited in shoulder mobility at this time, difficulty holding objects with pincher grasp.    Time 4   Period Weeks   Status New     PT LONG TERM GOAL #5   Title Pt. will demonstrate improved cervical AROM to Flex >30, Extension>35, Rotation>40, Side bending > 20 degrees   Baseline (L SB: 19 deg, R SB: 26 deg, Flex 18 deg, Ext 15 deg, L rot 29 deg, R rot 29 degrees with compensatory pattern   Time 4   Period Weeks   Status New               Plan - 04/26/16 1127    Clinical Impression Statement Patient is progressing with cervical ROM and strength. AROM/AAROM in seated and supine with holds at end range continue to progress. R shoulder continues to be hypomobile with grade I-IV mobilizations AP, PA, and inferior. Inferior mobilizations are painful to patient and position was altered. Proper body mechanics for lifting were performed since patient is now allowed to lift 25lbs per surgical protocol. Patient demonstrated good understanding of proper lifting technique at this time with 10lb box and 20lb box. Patient will  continue to benefit from skilled physical therapy services to improve cervical mobility and strength, shoulder ROM and function, and overall increased mobility and strength for return to previous levels of activity.    Rehab Potential Fair   Clinical Impairments Affecting Rehab Potential hx of cancer/chemo, cervical radiculopathy for years prior to surgery.    PT Frequency 2x / week   PT Duration 4 weeks   PT Treatment/Interventions ADLs/Self Care Home Management;Cryotherapy;DME Instruction;Ultrasound;Traction;Electrical Stimulation;Moist Heat;Iontophoresis 4mg /ml Dexamethasone;Gait training;Stair training;Functional mobility training;Therapeutic activities;Therapeutic exercise;Balance training;Patient/family education;Neuromuscular re-education;Manual techniques;Scar mobilization;Taping;Splinting;Energy conservation;Passive range of motion   PT Next Visit Plan cervical AROM,  AAROM, UE strengthening. lifting body mechanics   PT Home Exercise Plan see pt instructions   Consulted and Agree with Plan of Care Patient      Patient will benefit from skilled therapeutic intervention in order to improve the following deficits and impairments:  Abnormal gait, Decreased activity tolerance, Decreased balance, Decreased endurance, Decreased knowledge of precautions, Decreased mobility, Decreased range of motion, Difficulty walking, Decreased strength, Hypomobility, Impaired flexibility, Impaired perceived functional ability, Impaired sensation, Impaired UE functional use, Postural dysfunction, Improper body mechanics, Pain  Visit Diagnosis: Neck pain  Stiffness of cervical spine  Stiffness of right shoulder, not elsewhere classified  Muscle weakness (generalized)     Problem List Patient Active Problem List   Diagnosis Date Noted  . Malignant neoplasm of sigmoid colon ([place]) 07/30/2015  . Chronic obstructive pulmonary disease ([place]) 11/01/2014  . H/O disease 11/01/2014  . BP (high blood pressure)  11/01/2014  . Type 2 diabetes mellitus ([place]) 11/01/2014  . Current tobacco use 10/06/2014  . H/O malignant neoplasm of colon 11/11/2010    This entire session was performed under direct supervision and direction of a licensed therapist/therapist assistant. I have personally read, edited and approve of the note as written.  Pura Spice, PT, DPT # [id number] Janna Arch, SPT 04/27/2016, 12:35 PM  [facility] [address] [place], [place], [address] Phone: [phone]   Fax:  [phone]  Name: Cameron Howe MRN: [id number] Date of Birth: [birth year]

## 2016-04-28 ENCOUNTER — Ambulatory Visit: Payer: Medicare Other | Admitting: Physical Therapy

## 2016-04-28 ENCOUNTER — Encounter: Payer: Self-pay | Admitting: Physical Therapy

## 2016-04-28 DIAGNOSIS — M542 Cervicalgia: Secondary | ICD-10-CM

## 2016-04-28 DIAGNOSIS — M436 Torticollis: Secondary | ICD-10-CM

## 2016-04-28 DIAGNOSIS — M6281 Muscle weakness (generalized): Secondary | ICD-10-CM

## 2016-04-28 DIAGNOSIS — M25611 Stiffness of right shoulder, not elsewhere classified: Secondary | ICD-10-CM

## 2016-04-28 NOTE — Therapy (Signed)
[place] [facility] [address]. [place], [place], [address] Phone: [phone]   Fax:  [phone]  Physical Therapy Treatment  Patient Details  Name: Cameron Howe MRN: [id number] Date of Birth: 04-03-1945 Referring Provider: Adrian Prows MD (primary), Dr. Lovie Macadamia MD (surgeon)  Encounter Date: 04/28/2016      PT End of Session - 04/28/16 0902    Visit Number 6   Number of Visits 8   Date for PT Re-Evaluation 05/10/16   Authorization Time Period 6/10   PT Start Time 0857   PT Stop Time 0951   PT Time Calculation (min) 54 min   Activity Tolerance Patient tolerated treatment well;Patient limited by pain   Behavior During Therapy [facility] for tasks assessed/performed      Past Medical History:  Diagnosis Date  . Arthritis    SHOULDERS AND JOINTS  . Cancer [facility])    Colon cancer 11/2010 with parital resection with chemo and rad tx  . Dental bridge present    REMOVABLE  . Diabetes mellitus without complication (HCC)    TYPE 2, DIET CONTROLLED  . Hypertension   . Neuromuscular disorder (HCC)    WEAKNESS AND NUMBNESS HANDS AND FEET, POSSIBLY FROM CHEMO    Past Surgical History:  Procedure Laterality Date  . COLON SURGERY  NOV 2012   COLON REMOVED  . COLON SURGERY  APRIL 2014   OSTOMY REMOVED  . COLONOSCOPY N/A 04/21/2015   Procedure: COLONOSCOPY;  Surgeon: Hulen Luster, MD;  Location: [place];  Service: Gastroenterology;  Laterality: N/A;  DIABETIC, DIET CONTROLLED  . POLYPECTOMY N/A 04/21/2015   Procedure: POLYPECTOMY;  Surgeon: Hulen Luster, MD;  Location: [place];  Service: Gastroenterology;  Laterality: N/A;  . TUNNELED VENOUS PORT PLACEMENT      There were no vitals filed for this visit.      Subjective Assessment - 04/28/16 0900    Subjective Pt. reports 3/10 upper back/neck pain at this time.  Pt. wearing a 12 hour Lidocaine patch on upper back.  Pt. states he has returned to driving today.      Pertinent History hx of colon cancer/chemo, cervical radiculopathy, type 2 diabetes, COPD,cervical discecotmy, HTN   Limitations Sitting;Reading;Lifting;Standing;Walking;Writing;House hold activities   How long can you sit comfortably? limited due to cervical fatigue   How long can you stand comfortably? limited due to cervical fatigue and leg weakness   How long can you walk comfortably? limited by LE weakness   Patient Stated Goals want to be able to button shirt and brush hair independently, move neck to return to driving, and pick up objects like pennies    Currently in Pain? Yes   Pain Score 3    Pain Location Neck   Pain Orientation Posterior   Pain Descriptors / Indicators Aching;Throbbing   Pain Type Chronic pain;Surgical pain       TherEx B UBE 3 min. f/b (warm-up/no charge).   Seated shoulder pulley ex. (flexion/ abd.)- 20x each (issued for HEP).   Nautilus: 30# lat. Pull downs/ 20# tricep ext./ 30# scap. Retraction 30x each (good mechanics with min. Cuing).     Body mechanics when lifting 10lb box, and 20lb box pick up 4x carry and walk 78ft or each weight. Sit to stand no UE assistance 8x Scapular retractions 15x  Manual Cervical AAROM in supine and seated with PT applied gentle overpressure and guidance, 10x each direction with 10-15 second holds STM upper trap and  cervical musculature Grade I-IV AP, PA, Inferior mobilizations of R shoulder, pain with inferior PROM R shoulder flex, abd, ER    Pt. Response to medical necessity: Pt. Presents with global weakness, limited mobility of neck and shoulders, and decreased functional capacity for functional activities.        PT Long Term Goals - 04/21/16 1826      PT LONG TERM GOAL #1   Title Patient will decrease NDI score to < 27% for mild percieved disability and improved quality of life.    Baseline 4/3: 40% NDI   Time 4   Period Weeks   Status New     PT LONG TERM GOAL #2   Title Pt. will demonstrate  improved functional pincher grasp strength and mechanics to independently button own shirt to return to activities of daily living.    Baseline Pt. unable to button own shirt at this time, pincher grasp weak.    Time 4   Period Weeks   Status New     PT LONG TERM GOAL #3   Title Pt. will actively move head with no episodes of increased pain to return to functional activities such as driving.    Baseline waiting for protocol prior to moving head.    Time 4   Period Weeks   Status New     PT LONG TERM GOAL #4   Title Pt. will place 10 clothespin on shelf at eyelevel 10x each arm to allow for improved functional range of motion and pincher grasp for return to activities of daily living.    Baseline Pt. limited in shoulder mobility at this time, difficulty holding objects with pincher grasp.    Time 4   Period Weeks   Status New     PT LONG TERM GOAL #5   Title Pt. will demonstrate improved cervical AROM to Flex >30, Extension>35, Rotation>40, Side bending > 20 degrees   Baseline (L SB: 19 deg, R SB: 26 deg, Flex 18 deg, Ext 15 deg, L rot 29 deg, R rot 29 degrees with compensatory pattern   Time 4   Period Weeks   Status New            Plan - 04/28/16 1884    Clinical Impression Statement Pt. showing slow but consistent progress with cervical AROM, esp. after gentle manual AA/PROM.  Good understanding of lifting/ body mechanics with daily tasks.  Pt. understands to use ice if having an increase c/o muscle soreness with exervise or daily tasks.  Moderate muscle tightness noted in B UT/levator musculature with palpation and STM.  Pt. will continue to benefit from skilled PT services to increase cervical mobility and B UE muscle strength.  PT issued shoulder pulley ex. to promote increase ROM/ prevent frozen shoulder syndrome.       Rehab Potential Fair   Clinical Impairments Affecting Rehab Potential hx of cancer/chemo, cervical radiculopathy for years prior to surgery.    PT Frequency  2x / week   PT Duration 4 weeks   PT Treatment/Interventions ADLs/Self Care Home Management;Cryotherapy;DME Instruction;Ultrasound;Traction;Electrical Stimulation;Moist Heat;Iontophoresis 4mg /ml Dexamethasone;Gait training;Stair training;Functional mobility training;Therapeutic activities;Therapeutic exercise;Balance training;Patient/family education;Neuromuscular re-education;Manual techniques;Scar mobilization;Taping;Splinting;Energy conservation;Passive range of motion   PT Next Visit Plan cervical AROM, AAROM, UE strengthening. lifting body mechanics   PT Home Exercise Plan see pt instructions   Consulted and Agree with Plan of Care Patient      Patient will benefit from skilled therapeutic intervention in order to improve the following deficits and  impairments:  Abnormal gait, Decreased activity tolerance, Decreased balance, Decreased endurance, Decreased knowledge of precautions, Decreased mobility, Decreased range of motion, Difficulty walking, Decreased strength, Hypomobility, Impaired flexibility, Impaired perceived functional ability, Impaired sensation, Impaired UE functional use, Postural dysfunction, Improper body mechanics, Pain  Visit Diagnosis: Neck pain  Stiffness of cervical spine  Stiffness of right shoulder, not elsewhere classified  Muscle weakness (generalized)     Problem List Patient Active Problem List   Diagnosis Date Noted  . Malignant neoplasm of sigmoid colon ([place]) 07/30/2015  . Chronic obstructive pulmonary disease ([place]) 11/01/2014  . H/O disease 11/01/2014  . BP (high blood pressure) 11/01/2014  . Type 2 diabetes mellitus ([place]) 11/01/2014  . Current tobacco use 10/06/2014  . H/O malignant neoplasm of colon 11/11/2010   Pura Spice, PT, DPT # [phone] 04/29/2016, 1:50 PM  [place] [facility] [address] [place], [place], [address] Phone: [phone]   Fax:  [phone]  Name: Cameron Howe MRN:  [id number] Date of Birth: [birth year]

## 2016-05-03 ENCOUNTER — Ambulatory Visit: Payer: Medicare Other | Admitting: Physical Therapy

## 2016-05-03 DIAGNOSIS — M436 Torticollis: Secondary | ICD-10-CM

## 2016-05-03 DIAGNOSIS — M542 Cervicalgia: Secondary | ICD-10-CM

## 2016-05-03 DIAGNOSIS — M25611 Stiffness of right shoulder, not elsewhere classified: Secondary | ICD-10-CM

## 2016-05-03 DIAGNOSIS — M6281 Muscle weakness (generalized): Secondary | ICD-10-CM

## 2016-05-03 NOTE — Therapy (Signed)
[place] [facility] [address] [place], [place], [address] Phone: [phone]   Fax:  [phone]  Physical Therapy Treatment  Patient Details  Name: Cameron Howe MRN: [id number] Date of Birth: 11-21-1945 Referring Provider: Adrian Prows MD (primary), Dr. Lovie Macadamia MD (surgeon)  Encounter Date: 05/03/2016      PT End of Session - 05/03/16 0907    Visit Number 7   Number of Visits 8   Date for PT Re-Evaluation May 27, 2016   Authorization Type g codes   Authorization Time Period 7/10   PT Start Time 0859   PT Stop Time 0951   PT Time Calculation (min) 52 min   Activity Tolerance Patient tolerated treatment well;Patient limited by pain   Behavior During Therapy [facility] for tasks assessed/performed      Past Medical History:  Diagnosis Date  . Arthritis    SHOULDERS AND JOINTS  . Cancer [facility])    Colon cancer 11/2010 with parital resection with chemo and rad tx  . Dental bridge present    REMOVABLE  . Diabetes mellitus without complication (HCC)    TYPE 2, DIET CONTROLLED  . Hypertension   . Neuromuscular disorder (HCC)    WEAKNESS AND NUMBNESS HANDS AND FEET, POSSIBLY FROM CHEMO    Past Surgical History:  Procedure Laterality Date  . COLON SURGERY  NOV 2012   COLON REMOVED  . COLON SURGERY  APRIL 2014   OSTOMY REMOVED  . COLONOSCOPY N/A 04/21/2015   Procedure: COLONOSCOPY;  Surgeon: Hulen Luster, MD;  Location: [place];  Service: Gastroenterology;  Laterality: N/A;  DIABETIC, DIET CONTROLLED  . POLYPECTOMY N/A 04/21/2015   Procedure: POLYPECTOMY;  Surgeon: Hulen Luster, MD;  Location: [place];  Service: Gastroenterology;  Laterality: N/A;  . TUNNELED VENOUS PORT PLACEMENT      There were no vitals filed for this visit.      Subjective Assessment - 05/03/16 0901    Subjective Pt. reports 2/10 upper back/neck pain.  Pt. has stopped using Lidocaine patch (ran out).  No new complaints.  Pt. reports doing  "pretty good at night" but has stiff neck first thing in AM.     Pertinent History hx of colon cancer/chemo, cervical radiculopathy, type 2 diabetes, COPD,cervical discecotmy, HTN   Limitations Sitting;Reading;Lifting;Standing;Walking;Writing;House hold activities   How long can you sit comfortably? limited due to cervical fatigue   How long can you stand comfortably? limited due to cervical fatigue and leg weakness   How long can you walk comfortably? limited by LE weakness   Patient Stated Goals want to be able to button shirt and brush hair independently, move neck to return to driving, and pick up objects like pennies    Currently in Pain? Yes   Pain Score 2    Pain Location Neck   Pain Orientation Posterior   Pain Descriptors / Indicators Aching;Throbbing   Pain Type Chronic pain      TherEx B UBE 3 min. f/b (warm-up/no charge).   Discussed sh. Pulley/ HEP Nautilus: 30# lat. Pull downs/ 30# tricep ext./ 30# scap. Retraction/ 20# chest press 30x each (good mechanics with min. Cuing).     Body mechanics when lifting 10lb box, and 20lb box pick up 4x carry and walk 68ft or each weight. Light cervical isometrics 5x (rotn./ lateral flexion/chin tucks).    Manual Cervical AAROM in supine and seated with PT applied gentle overpressure and guidance, 10x each direction with 10-15 second holds  STM upper trap and cervical musculature Grade I-IV AP, PA, Inferior mobilizations of R shoulder, pain with inferior PROM R shoulder flex, abd, ER   Pt. Response to medical necessity: Pt. Presents with global weakness, limited mobility of neck and shoulders, and decreased functional capacity for functional activities.         PT Long Term Goals - 04/21/16 1826      PT LONG TERM GOAL #1   Title Patient will decrease NDI score to < 27% for mild percieved disability and improved quality of life.    Baseline 4/3: 40% NDI   Time 4   Period Weeks   Status New     PT LONG TERM GOAL #2    Title Pt. will demonstrate improved functional pincher grasp strength and mechanics to independently button own shirt to return to activities of daily living.    Baseline Pt. unable to button own shirt at this time, pincher grasp weak.    Time 4   Period Weeks   Status New     PT LONG TERM GOAL #3   Title Pt. will actively move head with no episodes of increased pain to return to functional activities such as driving.    Baseline waiting for protocol prior to moving head.    Time 4   Period Weeks   Status New     PT LONG TERM GOAL #4   Title Pt. will place 10 clothespin on shelf at eyelevel 10x each arm to allow for improved functional range of motion and pincher grasp for return to activities of daily living.    Baseline Pt. limited in shoulder mobility at this time, difficulty holding objects with pincher grasp.    Time 4   Period Weeks   Status New     PT LONG TERM GOAL #5   Title Pt. will demonstrate improved cervical AROM to Flex >30, Extension>35, Rotation>40, Side bending > 20 degrees   Baseline (L SB: 19 deg, R SB: 26 deg, Flex 18 deg, Ext 15 deg, L rot 29 deg, R rot 29 degrees with compensatory pattern   Time 4   Period Weeks   Status New           Plan - 05/04/16 1244    Clinical Impression Statement Increase cervical AROM (75% mobility) after manual tx. session.   Progressing well with B UE strengthening but shoulder ROM remains limited.  Pt. instructed to continue with shoulder pulley/ ex. program as tolerated.  Minimal cervical paraspinal muscle tendenress with light palpation.     Rehab Potential Fair   Clinical Impairments Affecting Rehab Potential hx of cancer/chemo, cervical radiculopathy for years prior to surgery.    PT Frequency 2x / week   PT Duration 4 weeks   PT Treatment/Interventions ADLs/Self Care Home Management;Cryotherapy;DME Instruction;Ultrasound;Traction;Electrical Stimulation;Moist Heat;Iontophoresis 4mg /ml Dexamethasone;Gait training;Stair  training;Functional mobility training;Therapeutic activities;Therapeutic exercise;Balance training;Patient/family education;Neuromuscular re-education;Manual techniques;Scar mobilization;Taping;Splinting;Energy conservation;Passive range of motion   PT Next Visit Plan cervical AROM, AAROM, UE strengthening. lifting body mechanics.  RECHECK c-spine AROM next tx.     PT Home Exercise Plan see pt instructions      Patient will benefit from skilled therapeutic intervention in order to improve the following deficits and impairments:  Abnormal gait, Decreased activity tolerance, Decreased balance, Decreased endurance, Decreased knowledge of precautions, Decreased mobility, Decreased range of motion, Difficulty walking, Decreased strength, Hypomobility, Impaired flexibility, Impaired perceived functional ability, Impaired sensation, Impaired UE functional use, Postural dysfunction, Improper body mechanics, Pain  Visit  Diagnosis: Neck pain  Stiffness of cervical spine  Stiffness of right shoulder, not elsewhere classified  Muscle weakness (generalized)     Problem List Patient Active Problem List   Diagnosis Date Noted  . Malignant neoplasm of sigmoid colon ([place]) 07/30/2015  . Chronic obstructive pulmonary disease ([place]) 11/01/2014  . H/O disease 11/01/2014  . BP (high blood pressure) 11/01/2014  . Type 2 diabetes mellitus ([place]) 11/01/2014  . Current tobacco use 10/06/2014  . H/O malignant neoplasm of colon 11/11/2010   Pura Spice, PT, DPT # [phone] 05/04/2016, 12:49 PM  [place] [facility] [address] [place], [place], [address] Phone: [phone]   Fax:  [phone]  Name: Cameron Howe MRN: [id number] Date of Birth: 01/27/1945

## 2016-05-05 ENCOUNTER — Ambulatory Visit: Payer: Medicare Other | Admitting: Physical Therapy

## 2016-05-05 DIAGNOSIS — M6281 Muscle weakness (generalized): Secondary | ICD-10-CM

## 2016-05-05 DIAGNOSIS — M542 Cervicalgia: Secondary | ICD-10-CM

## 2016-05-05 DIAGNOSIS — M436 Torticollis: Secondary | ICD-10-CM

## 2016-05-05 DIAGNOSIS — M25611 Stiffness of right shoulder, not elsewhere classified: Secondary | ICD-10-CM

## 2016-05-06 NOTE — Therapy (Signed)
[facility] [address] [place], [place], [address] Phone: [phone]   Fax:  [phone]  Physical Therapy Treatment  Patient Details  Name: Cameron Howe MRN: [id number] Date of Birth: 1945/09/29 Referring Provider: Adrian Prows MD (primary), Dr. Lovie Macadamia MD (surgeon)  Encounter Date: 05/05/2016      PT End of Session - 05/06/16 1558    Visit Number 8   Number of Visits 16   Date for PT Re-Evaluation 06-14-2016   Authorization Type g codes   Authorization Time Period --   Authorization - Visit Number 8   Authorization - Number of Visits 17   PT Start Time 5093   PT Stop Time 0949   PT Time Calculation (min) 51 min   Activity Tolerance Patient tolerated treatment well;Patient limited by pain   Behavior During Therapy [facility] for tasks assessed/performed      Past Medical History:  Diagnosis Date  . Arthritis    SHOULDERS AND JOINTS  . Cancer [facility])    Colon cancer 11/2010 with parital resection with chemo and rad tx  . Dental bridge present    REMOVABLE  . Diabetes mellitus without complication (HCC)    TYPE 2, DIET CONTROLLED  . Hypertension   . Neuromuscular disorder (HCC)    WEAKNESS AND NUMBNESS HANDS AND FEET, POSSIBLY FROM CHEMO    Past Surgical History:  Procedure Laterality Date  . COLON SURGERY  NOV 2012   COLON REMOVED  . COLON SURGERY  APRIL 2014   OSTOMY REMOVED  . COLONOSCOPY N/A 04/21/2015   Procedure: COLONOSCOPY;  Surgeon: Hulen Luster, MD;  Location: [place];  Service: Gastroenterology;  Laterality: N/A;  DIABETIC, DIET CONTROLLED  . POLYPECTOMY N/A 04/21/2015   Procedure: POLYPECTOMY;  Surgeon: Hulen Luster, MD;  Location: [place];  Service: Gastroenterology;  Laterality: N/A;  . TUNNELED VENOUS PORT PLACEMENT      There were no vitals filed for this visit.      Subjective Assessment - 05/05/16 0902    Subjective Pt. states he is doing better and better and has MD f/u  scheduled for May 17th.  Pt. states he is planning on trying to use mower this afternoon (PT discussed limiting time/ taking rest breaks).     Pertinent History hx of colon cancer/chemo, cervical radiculopathy, type 2 diabetes, COPD,cervical discecotmy, HTN   Limitations Sitting;Reading;Lifting;Standing;Walking;Writing;House hold activities   How long can you sit comfortably? limited due to cervical fatigue   How long can you stand comfortably? limited due to cervical fatigue and leg weakness   How long can you walk comfortably? limited by LE weakness   Patient Stated Goals want to be able to button shirt and brush hair independently, move neck to return to driving, and pick up objects like pennies    Currently in Pain? Yes   Pain Score 3    Pain Location Neck   Pain Orientation Posterior   Pain Descriptors / Indicators Aching      TherEx B UBE 3 min. f/b (warm-up/no charge).  Standing shoulder flexion (clothespins on 2nd shelf).  Reassessed B shoulder AROM.   Nautilus: 30# lat. Pull downs/ 30# tricep ext./ 30# scap. Retraction/ 20# chest press 30x each (good mechanics with min. Cuing).  Body mechanics when lifting 10lb box, and 20lb box pick up 4x carry and walk 71f or each weight. Light cervical isometrics 5x (rotn./ lateral flexion/chin tucks).  Key/ 3-jaw pinch grip/ grasping tasks.  Sled push/pull (25#) in hallway 45 feet x 2.    Manual Cervical AAROM in supine and seated with PT applied gentle overpressure and guidance, 10x each direction with 10-15 second holds STM upper trap and cervical musculature Grade I-IV AP, PA, Inferior mobilizations of R shoulder, pain with inferior PROM R shoulder flex, abd, ER   Pt. Response to medical necessity: Pt. Presents with global weakness, limited mobility of neck and shoulders, and decreased functional capacity for functional activities.       PT Long Term Goals - 05/05/16 0904      PT LONG TERM GOAL #1   Title Patient will  decrease NDI score to < 27% for mild percieved disability and improved quality of life.    Baseline 4/3: 40% NDI,  4/26: 32%   Time 4   Period Weeks   Status Partially Met     PT LONG TERM GOAL #2   Title Pt. will demonstrate improved functional pincher grasp strength and mechanics to independently button own shirt to return to activities of daily living.    Baseline difficulty buttoning own shirt at this time, pincher grasp weak.    Time 4   Period Weeks   Status Not Met     PT LONG TERM GOAL #3   Title Pt. will actively move head with no episodes of increased pain to return to functional activities such as driving.    Baseline goal met   Time 4   Period Weeks   Status Achieved     PT LONG TERM GOAL #4   Title Pt. will place 10 clothespin on shelf at eyelevel 10x each arm to allow for improved functional range of motion and pincher grasp for return to activities of daily living.    Baseline Minimal compensation technique with R shoulder flexion.     Time 4   Period Weeks   Status Partially Met     PT LONG TERM GOAL #5   Title Pt. will demonstrate improved cervical AROM to Flex >30, Extension>35, Rotation>40, Side bending > 20 degrees   Baseline (L SB: 19 deg, R SB: 26 deg, Flex 18 deg, Ext 15 deg, L rot 29 deg, R rot 29 degrees with compensatory pattern.  4/26: cervical AROM in sitting:  flexion (18 deg.), extension (30 deg.), R lat. flexion (48 deg.), L lat. flexion (42 deg.), R rotn. (32 deg.), L rotn. (32 deg.).    Time 4   Period Weeks   Status Partially Met            Plan - 05/05/16 0904    Clinical Impression Statement Pt. has shown marked increase in cervical AROM in sitting:  flexion (18 deg.), extension (30 deg.), R lat. flexion (48 deg.), L lat. flexion (42 deg.), R rotn. (32 deg.), L rotn. (32 deg.).  NDI: 32%.   Pt. has been showing steady progress with ability to complete daily functional tasks and even an increase in R shoulder AROM (able to touch ear).  Pt.  has demonstrated good body/ lifting mechanics and return to driving with no increase c/o neck pain.  Progressing UE muscle strengthening and focus on pinch/grasping to improve goals.  Pt. will continue to benefit from skilled PT services at this time to progress towards functional goals.     Rehab Potential Fair   Clinical Impairments Affecting Rehab Potential hx of cancer/chemo, cervical radiculopathy for years prior to surgery.    PT Frequency 2x / week   PT Duration  4 weeks   PT Treatment/Interventions ADLs/Self Care Home Management;Cryotherapy;DME Instruction;Ultrasound;Traction;Electrical Stimulation;Moist Heat;Iontophoresis '4mg'$ /ml Dexamethasone;Gait training;Stair training;Functional mobility training;Therapeutic activities;Therapeutic exercise;Balance training;Patient/family education;Neuromuscular re-education;Manual techniques;Scar mobilization;Taping;Splinting;Energy conservation;Passive range of motion      Patient will benefit from skilled therapeutic intervention in order to improve the following deficits and impairments:  Abnormal gait, Decreased activity tolerance, Decreased balance, Decreased endurance, Decreased knowledge of precautions, Decreased mobility, Decreased range of motion, Difficulty walking, Decreased strength, Hypomobility, Impaired flexibility, Impaired perceived functional ability, Impaired sensation, Impaired UE functional use, Postural dysfunction, Improper body mechanics, Pain  Visit Diagnosis: Neck pain  Stiffness of cervical spine  Stiffness of right shoulder, not elsewhere classified  Muscle weakness (generalized)       G-Codes - 05-10-16 1608    Functional Assessment Tool Used (Outpatient Only) NDI, clinical judgement, movement analysis   Functional Limitation Changing and maintaining body position   Changing and Maintaining Body Position Current Status (A2130) At least 20 percent but less than 40 percent impaired, limited or restricted   Changing and  Maintaining Body Position Goal Status (Q6578) At least 1 percent but less than 20 percent impaired, limited or restricted      Problem List Patient Active Problem List   Diagnosis Date Noted  . Malignant neoplasm of sigmoid colon ([place]) 07/30/2015  . Chronic obstructive pulmonary disease ([place]) 11/01/2014  . H/O disease 11/01/2014  . BP (high blood pressure) 11/01/2014  . Type 2 diabetes mellitus ([place]) 11/01/2014  . Current tobacco use 10/06/2014  . H/O malignant neoplasm of colon 11/11/2010   Pura Spice, PT, DPT # [phone] 05/06/2016, 4:13 PM  [place] [facility] [address] [place], [place], [address] Phone: [phone]   Fax:  [phone]  Name: Cameron Howe MRN: [id number] Date of Birth: Jul 13, 1945

## 2016-05-10 ENCOUNTER — Ambulatory Visit: Payer: Medicare Other | Attending: Infectious Diseases | Admitting: Physical Therapy

## 2016-05-10 ENCOUNTER — Encounter: Payer: Self-pay | Admitting: Physical Therapy

## 2016-05-10 DIAGNOSIS — M25611 Stiffness of right shoulder, not elsewhere classified: Secondary | ICD-10-CM | POA: Diagnosis present

## 2016-05-10 DIAGNOSIS — M542 Cervicalgia: Secondary | ICD-10-CM

## 2016-05-10 DIAGNOSIS — M436 Torticollis: Secondary | ICD-10-CM | POA: Insufficient documentation

## 2016-05-10 DIAGNOSIS — M6281 Muscle weakness (generalized): Secondary | ICD-10-CM | POA: Diagnosis present

## 2016-05-10 NOTE — Therapy (Signed)
[facility] [address]. [place], [place], [address] Phone: [phone]   Fax:  [phone]  Physical Therapy Treatment  Patient Details  Name: Cameron Howe MRN: [id number] Date of Birth: 1945/02/05 Referring Provider: Adrian Prows MD (primary), Dr. Lovie Macadamia MD (surgeon)  Encounter Date: 05/10/2016      PT End of Session - 05/11/16 1805    Visit Number 9   Date for PT Re-Evaluation 06/03/16   Authorization Type g codes   Authorization - Visit Number 9   Authorization - Number of Visits 17   PT Start Time 0810   PT Stop Time 0906   PT Time Calculation (min) 56 min   Activity Tolerance Patient tolerated treatment well;Patient limited by pain   Behavior During Therapy [facility] for tasks assessed/performed      Past Medical History:  Diagnosis Date  . Arthritis    SHOULDERS AND JOINTS  . Cancer [facility])    Colon cancer 11/2010 with parital resection with chemo and rad tx  . Dental bridge present    REMOVABLE  . Diabetes mellitus without complication (HCC)    TYPE 2, DIET CONTROLLED  . Hypertension   . Neuromuscular disorder (HCC)    WEAKNESS AND NUMBNESS HANDS AND FEET, POSSIBLY FROM CHEMO    Past Surgical History:  Procedure Laterality Date  . COLON SURGERY  NOV 2012   COLON REMOVED  . COLON SURGERY  APRIL 2014   OSTOMY REMOVED  . COLONOSCOPY N/A 04/21/2015   Procedure: COLONOSCOPY;  Surgeon: Hulen Luster, MD;  Location: [place];  Service: Gastroenterology;  Laterality: N/A;  DIABETIC, DIET CONTROLLED  . POLYPECTOMY N/A 04/21/2015   Procedure: POLYPECTOMY;  Surgeon: Hulen Luster, MD;  Location: [place];  Service: Gastroenterology;  Laterality: N/A;  . TUNNELED VENOUS PORT PLACEMENT      There were no vitals filed for this visit.      Subjective Assessment - 05/11/16 1748    Subjective Pt. reports no new complaints and states he was able to mow with no increase pain or issues.     Pertinent  History hx of colon cancer/chemo, cervical radiculopathy, type 2 diabetes, COPD,cervical discecotmy, HTN   Limitations Sitting;Reading;Lifting;Standing;Walking;Writing;House hold activities   How long can you sit comfortably? limited due to cervical fatigue   How long can you stand comfortably? limited due to cervical fatigue and leg weakness   How long can you walk comfortably? limited by LE weakness   Patient Stated Goals want to be able to button shirt and brush hair independently, move neck to return to driving, and pick up objects like pennies    Currently in Pain? Yes   Pain Score 2    Pain Location Neck   Pain Orientation Posterior   Pain Descriptors / Indicators Aching         TherEx  B UBE 3 min. f/b (warm-up/no charge). Standing overhead reaching (2nd shelf) with clothespin grasp/pinch.  Supine wt. Wand press-ups/ shoulder flexion 20x.   Nautilus: 30# lat. Pull downs/ 30# tricep ext./ 30# scap. Retraction/ 30# chest press30x each (good mechanics with min. Cuing).  Light cervical isometrics 5x (rotn./ lateral flexion/chin tucks). Key/ 3-jaw pinch grip/ grasping tasks.   Sled push/pull (25#) in hallway 45 feet x 2.    Manual Cervical AAROM in supine and seated with PT applied gentle overpressure and guidance, 10x each direction with 10-15 second holds STM upper trap and cervical musculature Grade I-IV AP, PA,  Inferior mobilizations of R shoulder, pain with inferior PROM R shoulder flex, abd, ER  Ice to B shoulder/neck after tx. In sitting position.    Pt. Response to medical necessity: Pt. Presents with global weakness, limited mobility of neck and shoulders, and decreased functional capacity for functional activities.        PT Long Term Goals - 05/05/16 0904      PT LONG TERM GOAL #1   Title Patient will decrease NDI score to < 27% for mild percieved disability and improved quality of life.    Baseline 4/3: 40% NDI,  4/26: 32%   Time 4   Period Weeks    Status Partially Met     PT LONG TERM GOAL #2   Title Pt. will demonstrate improved functional pincher grasp strength and mechanics to independently button own shirt to return to activities of daily living.    Baseline difficulty buttoning own shirt at this time, pincher grasp weak.    Time 4   Period Weeks   Status Not Met     PT LONG TERM GOAL #3   Title Pt. will actively move head with no episodes of increased pain to return to functional activities such as driving.    Baseline goal met   Time 4   Period Weeks   Status Achieved     PT LONG TERM GOAL #4   Title Pt. will place 10 clothespin on shelf at eyelevel 10x each arm to allow for improved functional range of motion and pincher grasp for return to activities of daily living.    Baseline Minimal compensation technique with R shoulder flexion.     Time 4   Period Weeks   Status Partially Met     PT LONG TERM GOAL #5   Title Pt. will demonstrate improved cervical AROM to Flex >30, Extension>35, Rotation>40, Side bending > 20 degrees   Baseline (L SB: 19 deg, R SB: 26 deg, Flex 18 deg, Ext 15 deg, L rot 29 deg, R rot 29 degrees with compensatory pattern.  4/26: cervical AROM in sitting:  flexion (18 deg.), extension (30 deg.), R lat. flexion (48 deg.), L lat. flexion (42 deg.), R rotn. (32 deg.), L rotn. (32 deg.).    Time 4   Period Weeks   Status Partially Met             Plan - 05/11/16 1806    Clinical Impression Statement Pt. working hard with progression of cervical AROM in a pain tolerable range with addition of B UE strengthening/ overhead reaching.  Pt. limtied by R shoulder limitations but small improvements noted.  Pinch grip/ grasping limitaitons remain presents as noted during clothespin tasks.  Pt. has been driving with no limitaitons safely over past week.     Rehab Potential Fair   Clinical Impairments Affecting Rehab Potential hx of cancer/chemo, cervical radiculopathy for years prior to surgery.    PT  Frequency 2x / week   PT Duration 4 weeks   PT Treatment/Interventions ADLs/Self Care Home Management;Cryotherapy;DME Instruction;Ultrasound;Traction;Electrical Stimulation;Moist Heat;Iontophoresis 17m/ml Dexamethasone;Gait training;Stair training;Functional mobility training;Therapeutic activities;Therapeutic exercise;Balance training;Patient/family education;Neuromuscular re-education;Manual techniques;Scar mobilization;Taping;Splinting;Energy conservation;Passive range of motion   PT Next Visit Plan cervical AROM, AAROM, UE strengthening. lifting body mechanics.  RECHECK c-spine AROM next tx.     PT Home Exercise Plan see pt instructions   Consulted and Agree with Plan of Care Patient      Patient will benefit from skilled therapeutic intervention in order to improve  the following deficits and impairments:  Abnormal gait, Decreased activity tolerance, Decreased balance, Decreased endurance, Decreased knowledge of precautions, Decreased mobility, Decreased range of motion, Difficulty walking, Decreased strength, Hypomobility, Impaired flexibility, Impaired perceived functional ability, Impaired sensation, Impaired UE functional use, Postural dysfunction, Improper body mechanics, Pain  Visit Diagnosis: Neck pain  Stiffness of cervical spine  Stiffness of right shoulder, not elsewhere classified  Muscle weakness (generalized)     Problem List Patient Active Problem List   Diagnosis Date Noted  . Malignant neoplasm of sigmoid colon ([place]) 07/30/2015  . Chronic obstructive pulmonary disease ([place]) 11/01/2014  . H/O disease 11/01/2014  . BP (high blood pressure) 11/01/2014  . Type 2 diabetes mellitus ([place]) 11/01/2014  . Current tobacco use 10/06/2014  . H/O malignant neoplasm of colon 11/11/2010   Pura Spice, PT, DPT # [id number] 05/11/2016, 6:09 PM  [facility] [address] [place], [place], [address] Phone: [phone]   Fax:   [phone]  Name: MALAKAI SCHOENHERR MRN: [id number] Date of Birth: 05-17-1945

## 2016-05-12 ENCOUNTER — Ambulatory Visit: Payer: Medicare Other | Admitting: Physical Therapy

## 2016-05-12 DIAGNOSIS — M542 Cervicalgia: Secondary | ICD-10-CM | POA: Diagnosis not present

## 2016-05-12 DIAGNOSIS — M6281 Muscle weakness (generalized): Secondary | ICD-10-CM

## 2016-05-12 DIAGNOSIS — M25611 Stiffness of right shoulder, not elsewhere classified: Secondary | ICD-10-CM

## 2016-05-12 DIAGNOSIS — M436 Torticollis: Secondary | ICD-10-CM

## 2016-05-12 NOTE — Therapy (Signed)
[facility] [address]. [place], [place], [address] Phone: [phone]   Fax:  [phone]  Physical Therapy Treatment  Patient Details  Name: Cameron Howe MRN: [id number] Date of Birth: March 11, 1945 Referring Provider: Adrian Prows MD (primary), Dr. Lovie Macadamia MD (surgeon)  Encounter Date: 05/12/2016      PT End of Session - 05/12/16 0817    Visit Number 10   Number of Visits 16   Date for PT Re-Evaluation 2016-06-11   Authorization Type g codes   Authorization Time Period 10/17   Authorization - Visit Number 10   Authorization - Number of Visits 17   PT Start Time 0807   PT Stop Time 0901   PT Time Calculation (min) 54 min   Activity Tolerance Patient tolerated treatment well;Patient limited by pain   Behavior During Therapy [facility] for tasks assessed/performed      Past Medical History:  Diagnosis Date  . Arthritis    SHOULDERS AND JOINTS  . Cancer [facility])    Colon cancer 11/2010 with parital resection with chemo and rad tx  . Dental bridge present    REMOVABLE  . Diabetes mellitus without complication (HCC)    TYPE 2, DIET CONTROLLED  . Hypertension   . Neuromuscular disorder (HCC)    WEAKNESS AND NUMBNESS HANDS AND FEET, POSSIBLY FROM CHEMO    Past Surgical History:  Procedure Laterality Date  . COLON SURGERY  NOV 2012   COLON REMOVED  . COLON SURGERY  APRIL 2014   OSTOMY REMOVED  . COLONOSCOPY N/A 04/21/2015   Procedure: COLONOSCOPY;  Surgeon: Hulen Luster, MD;  Location: [place];  Service: Gastroenterology;  Laterality: N/A;  DIABETIC, DIET CONTROLLED  . POLYPECTOMY N/A 04/21/2015   Procedure: POLYPECTOMY;  Surgeon: Hulen Luster, MD;  Location: [place];  Service: Gastroenterology;  Laterality: N/A;  . TUNNELED VENOUS PORT PLACEMENT      There were no vitals filed for this visit.      Subjective Assessment - 05/12/16 0815    Subjective Pt. 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     Pertinent History hx of colon cancer/chemo, cervical radiculopathy, type 2 diabetes, COPD,cervical discecotmy, HTN   Limitations Sitting;Reading;Lifting;Standing;Walking;Writing;House hold activities   How long can you sit comfortably? limited due to cervical fatigue   How long can you stand comfortably? limited due to cervical fatigue and leg weakness   How long can you walk comfortably? limited by LE weakness   Patient Stated Goals want to be able to button shirt and brush hair independently, move neck to return to driving, and pick up objects like pennies    Currently in Pain? Yes   Pain Score 2    Pain Location Neck   Pain Orientation Proximal;Lower   Pain Descriptors / Indicators Aching;Sore   Pain Type Chronic pain      TherEx  B UBE 3 min. f/b (warm-up/no charge). Standing B shoulder AROM/ cervical AROM with mirror feedback and PT overpressure as tolerated.     Nautilus: 40# lat. Pull downs/ 30# tricep ext./ 30# scap. Retraction/ 30# chest press30x each (good mechanics with min. Cuing).  Light cervical isometrics 5x (rotn./ lateral flexion/chin tucks).  Sled push/pull (25#) around PT clinic with proper head/neck posture.    Manual Cervical AAROM in supine and seated with PT applied gentle overpressure and guidance, 10x each direction with 10-15 second holds  STM upper trap and cervical musculature Reassessed cervical AROM in sitting posture.  Pain tolerable range with no increase c/o pain.   PROM R shoulder flex, abd, ER  Ice to B shoulder/neck after tx. In sitting position.    Pt. Response to medical necessity: Pt. Presents with global weakness, limited mobility of neck and shoulders, and decreased functional capacity for functional activities.         PT Long Term Goals - 05/05/16 0904      PT LONG TERM GOAL #1   Title Patient will decrease NDI score to < 27% for mild percieved disability and  improved quality of life.    Baseline 4/3: 40% NDI,  4/26: 32%   Time 4   Period Weeks   Status Partially Met     PT LONG TERM GOAL #2   Title Pt. will demonstrate improved functional pincher grasp strength and mechanics to independently button own shirt to return to activities of daily living.    Baseline difficulty buttoning own shirt at this time, pincher grasp weak.    Time 4   Period Weeks   Status Not Met     PT LONG TERM GOAL #3   Title Pt. will actively move head with no episodes of increased pain to return to functional activities such as driving.    Baseline goal met   Time 4   Period Weeks   Status Achieved     PT LONG TERM GOAL #4   Title Pt. will place 10 clothespin on shelf at eyelevel 10x each arm to allow for improved functional range of motion and pincher grasp for return to activities of daily living.    Baseline Minimal compensation technique with R shoulder flexion.     Time 4   Period Weeks   Status Partially Met     PT LONG TERM GOAL #5   Title Pt. will demonstrate improved cervical AROM to Flex >30, Extension>35, Rotation>40, Side bending > 20 degrees   Baseline (L SB: 19 deg, R SB: 26 deg, Flex 18 deg, Ext 15 deg, L rot 29 deg, R rot 29 degrees with compensatory pattern.  4/26: cervical AROM in sitting:  flexion (18 deg.), extension (30 deg.), R lat. flexion (48 deg.), L lat. flexion (42 deg.), R rotn. (32 deg.), L rotn. (32 deg.).    Time 4   Period Weeks   Status Partially Met               Plan - 05/12/16 0820    Clinical Impression Statement Cervical AROM: flexion (39 deg.), ext. (22 deg.), L rotn. (42 deg.), R rotn. (41 deg.), L lateral flex. (26 deg.), R lateral flex. (36 deg.).  Pt. able to tolerate more R shoulder strengthening/ overhead tasks with resistance/ no neck discomfort.  Pt. instructed to continue with home strengthening ex. program and avoid any pain provoking tasks.     Rehab Potential Fair   Clinical Impairments Affecting  Rehab Potential hx of cancer/chemo, cervical radiculopathy for years prior to surgery.    PT Frequency 2x / week   PT Duration 4 weeks   PT Treatment/Interventions ADLs/Self Care Home Management;Cryotherapy;DME Instruction;Ultrasound;Traction;Electrical Stimulation;Moist Heat;Iontophoresis '4mg'$ /ml Dexamethasone;Gait training;Stair training;Functional mobility training;Therapeutic activities;Therapeutic exercise;Balance training;Patient/family education;Neuromuscular re-education;Manual techniques;Scar mobilization;Taping;Splinting;Energy conservation;Passive range of motion   PT Next Visit Plan cervical AROM, AAROM, UE strengthening. lifting body mechanics.     PT Home Exercise Plan see pt instructions   Consulted and Agree with Plan of Care Patient  Patient will benefit from skilled therapeutic intervention in order to improve the following deficits and impairments:  Abnormal gait, Decreased activity tolerance, Decreased balance, Decreased endurance, Decreased knowledge of precautions, Decreased mobility, Decreased range of motion, Difficulty walking, Decreased strength, Hypomobility, Impaired flexibility, Impaired perceived functional ability, Impaired sensation, Impaired UE functional use, Postural dysfunction, Improper body mechanics, Pain  Visit Diagnosis: Neck pain  Stiffness of cervical spine  Stiffness of right shoulder, not elsewhere classified  Muscle weakness (generalized)     Problem List Patient Active Problem List   Diagnosis Date Noted  . Malignant neoplasm of sigmoid colon ([place]) 07/30/2015  . Chronic obstructive pulmonary disease ([place]) 11/01/2014  . H/O disease 11/01/2014  . BP (high blood pressure) 11/01/2014  . Type 2 diabetes mellitus ([place]) 11/01/2014  . Current tobacco use 10/06/2014  . H/O malignant neoplasm of colon 11/11/2010   Pura Spice, PT, DPT # [id number] 05/13/2016, 1:58 PM  [facility] [address] [place], [place], [address] Phone: [phone]   Fax:  [phone]  Name: Cameron Howe MRN: [id number] Date of Birth: Jul 03, 1945

## 2016-05-17 ENCOUNTER — Ambulatory Visit: Payer: Medicare Other | Admitting: Physical Therapy

## 2016-05-17 ENCOUNTER — Encounter: Payer: Medicare Other | Admitting: Physical Therapy

## 2016-05-17 DIAGNOSIS — M542 Cervicalgia: Secondary | ICD-10-CM

## 2016-05-17 DIAGNOSIS — M436 Torticollis: Secondary | ICD-10-CM

## 2016-05-17 DIAGNOSIS — M6281 Muscle weakness (generalized): Secondary | ICD-10-CM

## 2016-05-17 DIAGNOSIS — M25611 Stiffness of right shoulder, not elsewhere classified: Secondary | ICD-10-CM

## 2016-05-17 NOTE — Therapy (Signed)
[facility] [facility] [address] [place], [place], [address] Phone: [phone]   Fax:  [phone]  Physical Therapy Treatment  Patient Details  Name: Cameron Howe MRN: [id number] Date of Birth: [birth year] Referring Provider: Clydie Braun MD (primary), Dr. Terance Hart MD (surgeon)  Encounter Date: 05/17/2016      PT End of Session - 05/18/16 0758    Visit Number 11   Number of Visits 16   Date for PT Re-Evaluation 06/25/16   Authorization Type g codes   Authorization Time Period 11/17   Authorization - Visit Number 11   Authorization - Number of Visits 17   PT Start Time 0808   PT Stop Time 0901   PT Time Calculation (min) 53 min   Activity Tolerance Patient tolerated treatment well;Patient limited by pain   Behavior During Therapy [facility] for tasks assessed/performed      Past Medical History:  Diagnosis Date  . Arthritis    SHOULDERS AND JOINTS  . Cancer [facility])    Colon cancer 11/2010 with parital resection with chemo and rad tx  . Dental bridge present    REMOVABLE  . Diabetes mellitus without complication (HCC)    TYPE 2, DIET CONTROLLED  . Hypertension   . Neuromuscular disorder (HCC)    WEAKNESS AND NUMBNESS HANDS AND FEET, POSSIBLY FROM CHEMO    Past Surgical History:  Procedure Laterality Date  . COLON SURGERY  NOV 2012   COLON REMOVED  . COLON SURGERY  APRIL 2014   OSTOMY REMOVED  . COLONOSCOPY N/A 04/21/2015   Procedure: COLONOSCOPY;  Surgeon: Wallace Cullens, MD;  [facility];  Service: Gastroenterology;  Laterality: N/A;  DIABETIC, DIET CONTROLLED  . POLYPECTOMY N/A 04/21/2015   Procedure: POLYPECTOMY;  Surgeon: Wallace Cullens, MD;  Location: [facility];  Service: Gastroenterology;  Laterality: N/A;  . TUNNELED VENOUS PORT PLACEMENT      There were no vitals filed for this visit.      Subjective Assessment - 05/18/16 0755    Subjective Pt. states he is doing better.  No new complaints.   Pt. doing more around house/yard with less discomfort.     Pertinent History hx of colon cancer/chemo, cervical radiculopathy, type 2 diabetes, COPD,cervical discecotmy, HTN   Limitations Sitting;Reading;Lifting;Standing;Walking;Writing;House hold activities   How long can you sit comfortably? limited due to cervical fatigue   How long can you stand comfortably? limited due to cervical fatigue and leg weakness   How long can you walk comfortably? limited by LE weakness   Patient Stated Goals want to be able to button shirt and brush hair independently, move neck to return to driving, and pick up objects like pennies    Currently in Pain? Yes   Pain Score 2    Pain Location Neck   Pain Orientation Lower;Proximal   Pain Descriptors / Indicators Aching;Sore   Pain Type Chronic pain      TherEx  B UBE 3 min. f/b (warm-up/no charge). Standing B shoulder AROM/ cervical AROM with mirror feedback and PT overpressure as tolerated.    Nautilus: 40# lat. Pull downs/ 30# tricep ext./ 30# scap. Retraction/ 30# shoulder extension30x each (good mechanics with min. Cuing).  Light cervical isometrics in supine position 5x (rotn./ lateral flexion/chin tucks).  Supine 3# wt. Ex.: press-ups/ bicep curls/ tricep extension/ serratus punches 20x each.  (Pt. Is going to look for dumbbells).      Manual Cervical AAROM in supine and  seated with PT applied gentle overpressure and guidance, 10x each direction with 10-15 second holds STM upper trap and cervical musculature Pain tolerable range with no increase c/o pain.   PROM R shoulder flex, abd, ER   Pt. Response to medical necessity: Pt. Presents with global weakness, limited mobility of neck and shoulders, and decreased functional capacity for functional activities.        PT Long Term Goals - 05/05/16 0904      PT LONG TERM GOAL #1   Title Patient will decrease NDI score to < 27% for mild percieved disability and improved quality of  life.    Baseline 4/3: 40% NDI,  4/26: 32%   Time 4   Period Weeks   Status Partially Met     PT LONG TERM GOAL #2   Title Pt. will demonstrate improved functional pincher grasp strength and mechanics to independently button own shirt to return to activities of daily living.    Baseline difficulty buttoning own shirt at this time, pincher grasp weak.    Time 4   Period Weeks   Status Not Met     PT LONG TERM GOAL #3   Title Pt. will actively move head with no episodes of increased pain to return to functional activities such as driving.    Baseline goal met   Time 4   Period Weeks   Status Achieved     PT LONG TERM GOAL #4   Title Pt. will place 10 clothespin on shelf at eyelevel 10x each arm to allow for improved functional range of motion and pincher grasp for return to activities of daily living.    Baseline Minimal compensation technique with R shoulder flexion.     Time 4   Period Weeks   Status Partially Met     PT LONG TERM GOAL #5   Title Pt. will demonstrate improved cervical AROM to Flex >30, Extension>35, Rotation>40, Side bending > 20 degrees   Baseline (L SB: 19 deg, R SB: 26 deg, Flex 18 deg, Ext 15 deg, L rot 29 deg, R rot 29 degrees with compensatory pattern.  4/26: cervical AROM in sitting:  flexion (18 deg.), extension (30 deg.), R lat. flexion (48 deg.), L lat. flexion (42 deg.), R rotn. (32 deg.), L rotn. (32 deg.).    Time 4   Period Weeks   Status Partially Met            Plan - 05/18/16 0759    Clinical Impression Statement Pt. works hard during PT tx. session and progressing well with B UE/ cervical strengthening ex.  R shoulder joint stiffness/ moderate crepitus noted with overhead reaching.  Pt. working on fine motor/ grasping with use of R hand during shoulder/scapular ex.  No increase c/o neck pain after tx. session.      Rehab Potential Fair   Clinical Impairments Affecting Rehab Potential hx of cancer/chemo, cervical radiculopathy for years  prior to surgery.    PT Frequency 2x / week   PT Duration 4 weeks   PT Treatment/Interventions ADLs/Self Care Home Management;Cryotherapy;DME Instruction;Ultrasound;Traction;Electrical Stimulation;Moist Heat;Iontophoresis '4mg'$ /ml Dexamethasone;Gait training;Stair training;Functional mobility training;Therapeutic activities;Therapeutic exercise;Balance training;Patient/family education;Neuromuscular re-education;Manual techniques;Scar mobilization;Taping;Splinting;Energy conservation;Passive range of motion   PT Next Visit Plan cervical AROM, AAROM, UE strengthening. lifting body mechanics.     PT Home Exercise Plan see pt instructions   Consulted and Agree with Plan of Care Patient      Patient will benefit from skilled therapeutic intervention in order to  improve the following deficits and impairments:  Abnormal gait, Decreased activity tolerance, Decreased balance, Decreased endurance, Decreased knowledge of precautions, Decreased mobility, Decreased range of motion, Difficulty walking, Decreased strength, Hypomobility, Impaired flexibility, Impaired perceived functional ability, Impaired sensation, Impaired UE functional use, Postural dysfunction, Improper body mechanics, Pain  Visit Diagnosis: Neck pain  Stiffness of cervical spine  Stiffness of right shoulder, not elsewhere classified  Muscle weakness (generalized)     Problem List Patient Active Problem List   Diagnosis Date Noted  . Malignant neoplasm of sigmoid colon ([place]) 07/30/2015  . Chronic obstructive pulmonary disease ([place]) 11/01/2014  . H/O disease 11/01/2014  . BP (high blood pressure) 11/01/2014  . Type 2 diabetes mellitus ([place]) 11/01/2014  . Current tobacco use 10/06/2014  . H/O malignant neoplasm of colon 11/11/2010   Pura Spice, PT, DPT # [phone] 05/18/2016, 8:31 AM  [facility] [address] [place], [place], [address] Phone: [phone]   Fax:   [phone]  Name: Cameron Howe MRN: [id number] Date of Birth: [birth year]

## 2016-05-24 ENCOUNTER — Ambulatory Visit: Payer: Medicare Other | Admitting: Physical Therapy

## 2016-05-24 DIAGNOSIS — M25611 Stiffness of right shoulder, not elsewhere classified: Secondary | ICD-10-CM

## 2016-05-24 DIAGNOSIS — M6281 Muscle weakness (generalized): Secondary | ICD-10-CM

## 2016-05-24 DIAGNOSIS — M542 Cervicalgia: Secondary | ICD-10-CM | POA: Diagnosis not present

## 2016-05-24 DIAGNOSIS — M436 Torticollis: Secondary | ICD-10-CM

## 2016-05-24 NOTE — Therapy (Signed)
[facility] [address]. [place], [place], [address] Phone: [phone]   Fax:  [phone]  Physical Therapy Treatment  Patient Details  Name: Cameron Howe MRN: [id number] Date of Birth: [birth year] Referring Provider: Adrian Prows MD (primary), Dr. Lovie Macadamia MD (surgeon)  Encounter Date: 05/24/2016      PT End of Session - 05/25/16 1007    Visit Number 12   Number of Visits 16   Date for PT Re-Evaluation Jun 06, 2016   Authorization Type g codes   Authorization Time Period 12/17   Authorization - Visit Number 12   Authorization - Number of Visits 17   PT Start Time 1025   PT Stop Time 1123   PT Time Calculation (min) 58 min   Activity Tolerance Patient tolerated treatment well;Patient limited by pain   Behavior During Therapy [facility] for tasks assessed/performed      Past Medical History:  Diagnosis Date  . Arthritis    SHOULDERS AND JOINTS  . Cancer [facility])    Colon cancer 11/2010 with parital resection with chemo and rad tx  . Dental bridge present    REMOVABLE  . Diabetes mellitus without complication (HCC)    TYPE 2, DIET CONTROLLED  . Hypertension   . Neuromuscular disorder (HCC)    WEAKNESS AND NUMBNESS HANDS AND FEET, POSSIBLY FROM CHEMO    Past Surgical History:  Procedure Laterality Date  . COLON SURGERY  NOV 2012   COLON REMOVED  . COLON SURGERY  APRIL 2014   OSTOMY REMOVED  . COLONOSCOPY N/A 04/21/2015   Procedure: COLONOSCOPY;  Surgeon: Hulen Luster, MD;  Location: [place];  Service: Gastroenterology;  Laterality: N/A;  DIABETIC, DIET CONTROLLED  . POLYPECTOMY N/A 04/21/2015   Procedure: POLYPECTOMY;  Surgeon: Hulen Luster, MD;  Location: [place];  Service: Gastroenterology;  Laterality: N/A;  . TUNNELED VENOUS PORT PLACEMENT      There were no vitals filed for this visit.      Subjective Assessment - 05/25/16 0954    Subjective Pt. continues to report that he is doing better.  Pt.  able to turn neck/ body while driving with less discomfort.  Pt. remains limited by significant R shoulder limitations with reaching/ overhead tasks.  Pt. will discuss shoulder with MD during next f/u visit.     Pertinent History hx of colon cancer/chemo, cervical radiculopathy, type 2 diabetes, COPD,cervical discecotmy, HTN   Limitations Sitting;Reading;Lifting;Standing;Walking;Writing;House hold activities   How long can you sit comfortably? limited due to cervical fatigue   How long can you stand comfortably? limited due to cervical fatigue and leg weakness   How long can you walk comfortably? limited by LE weakness   Patient Stated Goals want to be able to button shirt and brush hair independently, move neck to return to driving, and pick up objects like pennies    Currently in Pain? Yes   Pain Score 2    Pain Location Neck   Pain Orientation Lower   Pain Descriptors / Indicators Aching;Sore   Pain Type Chronic pain   Pain Onset More than a month ago       TherEx Reassessed cervical AROM in seated posture.   B UBE 3 min. f/b (warm-up/no charge). Standing B shoulder AROM/ cervical AROM with mirror feedback and PT overpressure as tolerated.  Light cervical isometrics in supine position 5x (rotn./ lateral flexion/chin tucks).  Supine 3# wt. Ex.: press-ups/ bicep curls/ tricep extension/ serratus punches 20x  each.  See new HEP (handouts provided)- pt. Has light dumbbells at home.       Manual Cervical AAROM in supine and seated with PT applied gentle overpressure and guidance, 10x each direction with 10+ second holds STM upper trap and cervical musculature in supine/ seated posture.   PROM R shoulder flex, abd, ER   Pt. Response to medical necessity: Pt. Presents with global weakness, limited mobility of neck and shoulders, and decreased functional capacity for functional activities.  Will discuss MD f/u visit.          PT Long Term Goals - 05/25/16 1008       PT LONG TERM GOAL #1   Title Patient will decrease NDI score to < 27% for mild percieved disability and improved quality of life.    Baseline 4/3: 40% NDI,  4/26: 32%, 5/15: 30%   Time 4   Period Weeks   Status Partially Met     PT LONG TERM GOAL #2   Title Pt. will demonstrate improved functional pincher grasp strength and mechanics to independently button own shirt to return to activities of daily living.    Baseline limitations with buttoning shirt/ fine motor movements.    Time 4   Period Weeks   Status Not Met     PT LONG TERM GOAL #3   Title Pt. will actively move head with no episodes of increased pain to return to functional activities such as driving.    Baseline goal met   Time 4   Period Weeks   Status Achieved     PT LONG TERM GOAL #4   Title Pt. will place 10 clothespin on shelf at eyelevel 10x each arm to allow for improved functional range of motion and pincher grasp for return to activities of daily living.    Baseline Minimal compensation technique with R shoulder flexion.     Time 4   Period Weeks   Status Partially Met     PT LONG TERM GOAL #5   Title Pt. will demonstrate improved cervical AROM to Flex >30, Extension>35, Rotation>40, Side bending > 20 degrees   Baseline (L SB: 19 deg, R SB: 26 deg, Flex 18 deg, Ext 15 deg, L rot 29 deg, R rot 29 degrees with compensatory pattern.  4/26: cervical AROM in sitting:  flexion (18 deg.), extension (30 deg.), R lat. flexion (48 deg.), L lat. flexion (42 deg.), R rotn. (32 deg.), L rotn. (32 deg.).    Time 4   Period Weeks   Status Partially Met            Plan - 05/25/16 1007    Clinical Impression Statement Pt. has shown consistent progress with functional cervical AROM (less pain).  Pt. limited with R shoulder flexion/ overhead reaching due to pain/muscle weakness/ joint crepitus.  Pt. has returned to driving/ completing yard work with improve cervical AROM/ less pain.  Pt. continues to have discomfort with  sleeping position at night.  Pt. returns to MD this week to discuss R shoulder limitations and f/u with cervical mobility.       Rehab Potential Fair   Clinical Impairments Affecting Rehab Potential hx of cancer/chemo, cervical radiculopathy for years prior to surgery.    PT Frequency 2x / week   PT Duration 4 weeks   PT Treatment/Interventions ADLs/Self Care Home Management;Cryotherapy;DME Instruction;Ultrasound;Traction;Electrical Stimulation;Moist Heat;Iontophoresis '4mg'$ /ml Dexamethasone;Gait training;Stair training;Functional mobility training;Therapeutic activities;Therapeutic exercise;Balance training;Patient/family education;Neuromuscular re-education;Manual techniques;Scar mobilization;Taping;Splinting;Energy conservation;Passive range of motion   PT  Next Visit Plan Discuss MD appt.  Possible discharge with HEP focus pending MD f/u visit.  Pt. will discuss R shoulder with MD.     PT Home Exercise Plan see pt instructions   Consulted and Agree with Plan of Care Patient      Patient will benefit from skilled therapeutic intervention in order to improve the following deficits and impairments:  Abnormal gait, Decreased activity tolerance, Decreased balance, Decreased endurance, Decreased knowledge of precautions, Decreased mobility, Decreased range of motion, Difficulty walking, Decreased strength, Hypomobility, Impaired flexibility, Impaired perceived functional ability, Impaired sensation, Impaired UE functional use, Postural dysfunction, Improper body mechanics, Pain  Visit Diagnosis: Neck pain  Stiffness of cervical spine  Stiffness of right shoulder, not elsewhere classified  Muscle weakness (generalized)       G-Codes - 2016-06-12 1010    Functional Assessment Tool Used (Outpatient Only) NDI, clinical judgement, movement analysis   Functional Limitation Changing and maintaining body position   Changing and Maintaining Body Position Current Status (E9381) At least 20 percent but  less than 40 percent impaired, limited or restricted   Changing and Maintaining Body Position Goal Status (O1751) At least 1 percent but less than 20 percent impaired, limited or restricted   Changing and Maintaining Body Position Discharge Status (W2585) At least 20 percent but less than 40 percent impaired, limited or restricted      Problem List Patient Active Problem List   Diagnosis Date Noted  . Malignant neoplasm of sigmoid colon ([place]) 07/30/2015  . Chronic obstructive pulmonary disease ([place]) 11/01/2014  . H/O disease 11/01/2014  . BP (high blood pressure) 11/01/2014  . Type 2 diabetes mellitus ([place]) 11/01/2014  . Current tobacco use 10/06/2014  . H/O malignant neoplasm of colon 11/11/2010   Pura Spice, PT, DPT # [phone] 06-12-2016, 6:02 PM  [place] [facility] [address] [place], [place], [address] Phone: [phone]   Fax:  [phone]  Name: Cameron Howe MRN: [id number] Date of Birth: June 14, 1945

## 2016-05-25 ENCOUNTER — Encounter: Payer: Self-pay | Admitting: Physical Therapy

## 2016-08-05 ENCOUNTER — Other Ambulatory Visit: Payer: Medicare Other

## 2016-08-12 ENCOUNTER — Ambulatory Visit: Payer: Medicare Other | Admitting: Oncology

## 2016-08-15 ENCOUNTER — Inpatient Hospital Stay: Payer: Medicare Other | Attending: Oncology

## 2016-08-15 DIAGNOSIS — N189 Chronic kidney disease, unspecified: Secondary | ICD-10-CM | POA: Insufficient documentation

## 2016-08-15 DIAGNOSIS — F17210 Nicotine dependence, cigarettes, uncomplicated: Secondary | ICD-10-CM | POA: Insufficient documentation

## 2016-08-15 DIAGNOSIS — Z923 Personal history of irradiation: Secondary | ICD-10-CM | POA: Insufficient documentation

## 2016-08-15 DIAGNOSIS — Z79899 Other long term (current) drug therapy: Secondary | ICD-10-CM | POA: Insufficient documentation

## 2016-08-15 DIAGNOSIS — Z85038 Personal history of other malignant neoplasm of large intestine: Secondary | ICD-10-CM | POA: Insufficient documentation

## 2016-08-15 DIAGNOSIS — E1122 Type 2 diabetes mellitus with diabetic chronic kidney disease: Secondary | ICD-10-CM | POA: Insufficient documentation

## 2016-08-15 DIAGNOSIS — I129 Hypertensive chronic kidney disease with stage 1 through stage 4 chronic kidney disease, or unspecified chronic kidney disease: Secondary | ICD-10-CM | POA: Insufficient documentation

## 2016-08-15 DIAGNOSIS — Z9221 Personal history of antineoplastic chemotherapy: Secondary | ICD-10-CM | POA: Insufficient documentation

## 2016-08-15 DIAGNOSIS — Z7982 Long term (current) use of aspirin: Secondary | ICD-10-CM | POA: Insufficient documentation

## 2016-08-15 DIAGNOSIS — C187 Malignant neoplasm of sigmoid colon: Secondary | ICD-10-CM

## 2016-08-16 LAB — CEA: CEA: 5.3 ng/mL — ABNORMAL HIGH (ref 0.0–4.7)

## 2016-08-17 NOTE — Progress Notes (Signed)
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  INTERVAL HISTORY: Patient returns to clinic today for further evaluation and routine six-month follow-up. He currently feels well and is asymptomatic. He has no neurologic complaints. He denies any recent fevers or illnesses. He has a good appetite and denies weight loss. He has no chest pain or shortness of breath. He denies any nausea, vomiting, constipation, or diarrhea. He has noted no changes in his bowel movements and denies any melena or hematochezia. He has no urinary complaints. Patient feels at his baseline and offers no specific complaints today.   REVIEW OF SYSTEMS:   Review of Systems  Constitutional: Negative.  Negative for fever, malaise/fatigue and weight loss.  Respiratory: Negative.  Negative for cough and shortness of breath.   Cardiovascular: Negative.  Negative for chest pain and leg swelling.  Gastrointestinal: Negative.  Negative for abdominal pain, blood in stool, constipation, diarrhea, melena, nausea and vomiting.  Genitourinary: Negative.   Musculoskeletal: Positive for back pain.  Skin: Negative.  Negative for rash.  Neurological: Negative.  Negative for weakness.  Psychiatric/Behavioral: Negative.  The patient is not nervous/anxious.     As per HPI. Otherwise, a complete review of systems is negative.  ONCOLOGY HISTORY:  No history exists.    PAST MEDICAL HISTORY: Past Medical History:  Diagnosis Date  . Arthritis    SHOULDERS AND JOINTS  . Cancer [facility])    Colon cancer 11/2010 with parital resection with chemo and rad tx  . Dental bridge present    REMOVABLE  . Diabetes mellitus without complication (HCC)    TYPE 2, DIET CONTROLLED  . Hypertension     . Neuromuscular disorder ([place])    WEAKNESS AND NUMBNESS HANDS AND FEET, POSSIBLY FROM CHEMO    PAST SURGICAL HISTORY: Past Surgical History:  Procedure Laterality Date  . COLON SURGERY  NOV 2012   COLON REMOVED  . COLON SURGERY  APRIL 2014   OSTOMY REMOVED  . COLONOSCOPY N/A 04/21/2015   Procedure: COLONOSCOPY;  Surgeon: Hulen Luster, MD;  Location: [place];  Service: Gastroenterology;  Laterality: N/A;  DIABETIC, DIET CONTROLLED  . POLYPECTOMY N/A 04/21/2015   Procedure: POLYPECTOMY;  Surgeon: Hulen Luster, MD;  Location: [place];  Service: Gastroenterology;  Laterality: N/A;  . TUNNELED VENOUS PORT PLACEMENT      FAMILY HISTORY: Reviewed and unchanged. No reported history of malignancy or chronic disease.   ADVANCED DIRECTIVES:    HEALTH MAINTENANCE: Social History  Substance Use Topics  . Smoking status: Current Every Day Smoker    Packs/day: 0.50    Years: 30.00    Types: Cigarettes  . Smokeless tobacco: Never Used  . Alcohol use No    No Known Allergies  Current Outpatient Prescriptions  Medication Sig Dispense Refill  . acetaminophen (TYLENOL) 650 MG CR tablet Take 650 mg by mouth every 8 (eight) hours as needed for pain.    Marland Kitchen aspirin 81 MG tablet Take 81 mg by mouth daily. 800AM    . hydrochlorothiazide (HYDRODIURIL) 12.5 MG tablet Take 12.5 mg by mouth daily.    . valsartan (DIOVAN) 320 MG tablet Take 320 mg by mouth daily.     No current facility-administered medications for this visit.  OBJECTIVE: BP (!) 160/71 (BP Location: Left Arm, Patient Position: Sitting)   Pulse 75   Temp 98.8 F (37.1 C) (Tympanic)   Resp 18   Wt 204 lb 0.6 oz (92.5 kg)   BMI 27.67 kg/m    Body mass index is 27.67 kg/m.    ECOG FS:0 - Asymptomatic  General: Well-developed, well-nourished, no acute distress. Eyes: Pink conjunctiva, anicteric sclera. Lungs: Clear to auscultation bilaterally. Heart: Regular rate and rhythm. No rubs, murmurs, or  gallops. Abdomen: Soft, nontender, nondistended. No organomegaly noted, normoactive bowel sounds. Musculoskeletal: No edema, cyanosis, or clubbing. Neuro: Alert, answering all questions appropriately. Cranial nerves grossly intact. Skin: No rashes or petechiae noted. Psych: Normal affect.   LAB RESULTS:  No visits with results within 3 Day(s) from this visit.  Latest known visit with results is:  Appointment on 08/15/2016  Component Date Value Ref Range Status  . CEA 08/15/2016 5.3* 0.0 - 4.7 ng/mL Final   Comment: (NOTE)       Roche ECLIA methodology       Nonsmokers  <3.9                                     Smokers     <5.6 Performed At: [facility] [place], [place] [id number] Lindon Romp MD [id number]     STUDIES: No results found.  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   PLAN:    1. 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 He subsequently underwent partial colectomy and adjuvant chemotherapy which was completed on July 15, 2011. He then completed 5-FU and XRT in October 2013. His most recent colonoscopy on April 21, 2015 was reported as negative. No evidence of disease. Patient's has slightly trended up over the past year, but this has been persistently elevated for several years. Return to clinic in 6 months with repeat laboratory work only and then in one year for further evaluation. Patient will require a repeat colonoscopy in April 2020. If this continues to be negative, he can be discharged from clinic at that time.  3. Chronic renal insufficiency: Patient's creatinine is mildly elevated. Unclear his baseline. Monitor.   Patient expressed understanding and was in agreement with this plan. He also understands that He can call clinic at any time with any questions, concerns, or complaints.    Lloyd Huger, MD   08/19/2016 1:11  PM

## 2016-08-19 ENCOUNTER — Inpatient Hospital Stay (HOSPITAL_BASED_OUTPATIENT_CLINIC_OR_DEPARTMENT_OTHER): Payer: Medicare Other | Admitting: Oncology

## 2016-08-19 VITALS — BP 160/71 | HR 75 | Temp 98.8°F | Resp 18 | Wt 204.0 lb

## 2016-08-19 DIAGNOSIS — Z9221 Personal history of antineoplastic chemotherapy: Secondary | ICD-10-CM | POA: Diagnosis not present

## 2016-08-19 DIAGNOSIS — Z85038 Personal history of other malignant neoplasm of large intestine: Secondary | ICD-10-CM | POA: Diagnosis not present

## 2016-08-19 DIAGNOSIS — F17210 Nicotine dependence, cigarettes, uncomplicated: Secondary | ICD-10-CM

## 2016-08-19 DIAGNOSIS — N189 Chronic kidney disease, unspecified: Secondary | ICD-10-CM

## 2016-08-19 DIAGNOSIS — Z923 Personal history of irradiation: Secondary | ICD-10-CM

## 2016-08-19 DIAGNOSIS — C187 Malignant neoplasm of sigmoid colon: Secondary | ICD-10-CM

## 2016-08-19 DIAGNOSIS — I129 Hypertensive chronic kidney disease with stage 1 through stage 4 chronic kidney disease, or unspecified chronic kidney disease: Secondary | ICD-10-CM

## 2016-08-19 NOTE — Progress Notes (Signed)
Patient is here today for follow up he is doing well, no complaints.

## 2016-09-08 ENCOUNTER — Encounter: Payer: Self-pay | Admitting: Physical Therapy

## 2016-09-08 ENCOUNTER — Ambulatory Visit: Payer: Medicare Other | Attending: Neurosurgery | Admitting: Physical Therapy

## 2016-09-08 DIAGNOSIS — G8929 Other chronic pain: Secondary | ICD-10-CM | POA: Diagnosis present

## 2016-09-08 DIAGNOSIS — M6281 Muscle weakness (generalized): Secondary | ICD-10-CM | POA: Insufficient documentation

## 2016-09-08 DIAGNOSIS — M2560 Stiffness of unspecified joint, not elsewhere classified: Secondary | ICD-10-CM | POA: Insufficient documentation

## 2016-09-08 DIAGNOSIS — M5442 Lumbago with sciatica, left side: Secondary | ICD-10-CM | POA: Diagnosis not present

## 2016-09-08 DIAGNOSIS — M5441 Lumbago with sciatica, right side: Secondary | ICD-10-CM | POA: Insufficient documentation

## 2016-09-08 NOTE — Therapy (Addendum)
[place] [facility] [address] [place], [place], [address] Phone: [phone]   Fax:  [phone]  Physical Therapy Evaluation  Patient Details  Name: Cameron Howe MRN: [id number] Date of Birth: Mar 30, 1945 Referring Provider: Dr. Izora Ribas  Encounter Date: 09/08/2016    PT End of Session - 09/09/16 1701    Visit Number 1   Number of Visits 8   Date for PT Re-Evaluation 2016/10/29   Authorization Type g codes   Authorization - Visit Number 1   Authorization - Number of Visits 10   PT Start Time 0728   PT Stop Time 0829   PT Time Calculation (min) 61 min   Activity Tolerance Patient tolerated treatment well   Behavior During Therapy [facility] for tasks assessed/performed        Past Medical History:  Diagnosis Date  . Arthritis    SHOULDERS AND JOINTS  . Cancer [facility])    Colon cancer 11/2010 with parital resection with chemo and rad tx  . Dental bridge present    REMOVABLE  . Diabetes mellitus without complication (HCC)    TYPE 2, DIET CONTROLLED  . Hypertension   . Neuromuscular disorder (HCC)    WEAKNESS AND NUMBNESS HANDS AND FEET, POSSIBLY FROM CHEMO    Past Surgical History:  Procedure Laterality Date  . COLON SURGERY  NOV 2012   COLON REMOVED  . COLON SURGERY  APRIL 2014   OSTOMY REMOVED  . COLONOSCOPY N/A 04/21/2015   Procedure: COLONOSCOPY;  Surgeon: Hulen Luster, MD;  Location: [place];  Service: Gastroenterology;  Laterality: N/A;  DIABETIC, DIET CONTROLLED  . POLYPECTOMY N/A 04/21/2015   Procedure: POLYPECTOMY;  Surgeon: Hulen Luster, MD;  Location: [place];  Service: Gastroenterology;  Laterality: N/A;  . TUNNELED VENOUS PORT PLACEMENT      There were no vitals filed for this visit.    MD f/u mid-October.  Pt. c/o low mid-back pain with radicular symptoms in B LE.  Pt. reports inconsistent causes with low back pain and movement.  2/10 low back pain currently at rest.  Pt. reports severe pain 7  weeks ago in low back (catching symptoms while trying to walk to bathroom in AM).       Hamstring flex.: 60 deg. B  (-) SLR:  LLD: R 97 cm/ L 98 cm.   L L1-3 paraspinal muscle tenderness.  No tenderness no R low back.  Moderate generalized hypomobility low thoracic to L5 region.    MH to low back in seated posture while completing outcome measures      Pt. is a pleasant 71 y/o male with c/o low back pain with radicular symptoms.  Pt. known well to PT clinic after recent treatments for cervical limitations.  Pt. reports 2/10 mid-low back pain and stiffness currently at rest.  Pt. presents with good B LE muscle strength but limited core muscle control/ TrA contraction.  Pt. has significant hypomobility in thoracic/lumbar spine during manual tx./ mobs.  Moderate hip/ hamstring muscle tightness noted.  MODI: 32%.  Pt. will benefit from short-term skilled PT services to increase lumbar/ hip/ LE muscle flexibility and develop and core stability ex. program to improve pain-free functional mobility.           PT Long Term Goals - 09/09/16 1823      PT LONG TERM GOAL #1   Title Pt. will decrease MODI to <20% to improve perceived disability/ improved quality of life.  Baseline 8/30:  32%   Time 4   Period Weeks   Status New   Target Date 10/06/16     PT LONG TERM GOAL #2   Title Pt. will be independent with HEP to increase core stability/ LE muscle flexibility to [facility] to improve functional mobility with yardwork/ daily tasks.    Baseline Moderate lumbar/hip muscle tightness/ core muscle weakness.  Pt. currently not exercising.     Time 4   Period Weeks   Status New     PT LONG TERM GOAL #3   Title Pt. will report no lumbar/LE radicular symptoms consistently for a week to improve mobility.     Baseline B LE radicular symptoms with increase standing/ daily tasks.    Time 4   Period Weeks   Status New          Patient will benefit from skilled therapeutic intervention in order  to improve the following deficits and impairments:  Abnormal gait, Decreased activity tolerance, Decreased balance, Decreased endurance, Decreased knowledge of precautions, Decreased mobility, Decreased range of motion, Difficulty walking, Decreased strength, Hypomobility, Impaired flexibility, Impaired perceived functional ability, Impaired sensation, Impaired UE functional use, Postural dysfunction, Improper body mechanics, Pain  Visit Diagnosis: Chronic bilateral low back pain with bilateral sciatica  Joint stiffness  Muscle weakness (generalized)     Problem List Patient Active Problem List   Diagnosis Date Noted  . Malignant neoplasm of sigmoid colon ([place]) 07/30/2015  . Chronic obstructive pulmonary disease ([place]) 11/01/2014  . H/O disease 11/01/2014  . BP (high blood pressure) 11/01/2014  . Type 2 diabetes mellitus ([place]) 11/01/2014  . Current tobacco use 10/06/2014  . H/O malignant neoplasm of colon 11/11/2010   Pura Spice, PT, DPT # [phone] 09/09/2016, 6:26 PM   [facility] [address] [place], [place], [address] Phone: [phone]   Fax:  [phone]  Name: Cameron Howe MRN: [id number] Date of Birth: Aug 11, 1945

## 2016-09-09 DIAGNOSIS — M5442 Lumbago with sciatica, left side: Secondary | ICD-10-CM | POA: Diagnosis not present

## 2016-09-16 ENCOUNTER — Ambulatory Visit: Payer: Medicare Other | Attending: Neurosurgery | Admitting: Physical Therapy

## 2016-09-16 DIAGNOSIS — M5441 Lumbago with sciatica, right side: Secondary | ICD-10-CM | POA: Insufficient documentation

## 2016-09-16 DIAGNOSIS — G8929 Other chronic pain: Secondary | ICD-10-CM | POA: Diagnosis present

## 2016-09-16 DIAGNOSIS — M5442 Lumbago with sciatica, left side: Secondary | ICD-10-CM | POA: Diagnosis present

## 2016-09-16 DIAGNOSIS — M6281 Muscle weakness (generalized): Secondary | ICD-10-CM | POA: Diagnosis present

## 2016-09-16 DIAGNOSIS — M2560 Stiffness of unspecified joint, not elsewhere classified: Secondary | ICD-10-CM | POA: Diagnosis present

## 2016-09-16 NOTE — Therapy (Signed)
[facility] [address] [place], [place], [address] Phone: [phone]   Fax:  [phone]  Physical Therapy Treatment  Patient Details  Name: Cameron Howe MRN: [id number] Date of Birth: 08/06/1945 Referring Provider: Dr. Izora Ribas  Encounter Date: 09/16/2016      PT End of Session - 09/17/16 1441    Visit Number 2   Number of Visits 8   Date for PT Re-Evaluation 2016-10-28   Authorization Type g codes   Authorization - Visit Number 2   Authorization - Number of Visits 10   PT Start Time 2671   PT Stop Time 0846   PT Time Calculation (min) 51 min   Activity Tolerance Patient tolerated treatment well   Behavior During Therapy [facility] for tasks assessed/performed      Past Medical History:  Diagnosis Date  . Arthritis    SHOULDERS AND JOINTS  . Cancer [facility])    Colon cancer 11/2010 with parital resection with chemo and rad tx  . Dental bridge present    REMOVABLE  . Diabetes mellitus without complication (HCC)    TYPE 2, DIET CONTROLLED  . Hypertension   . Neuromuscular disorder (HCC)    WEAKNESS AND NUMBNESS HANDS AND FEET, POSSIBLY FROM CHEMO    Past Surgical History:  Procedure Laterality Date  . COLON SURGERY  NOV 2012   COLON REMOVED  . COLON SURGERY  APRIL 2014   OSTOMY REMOVED  . COLONOSCOPY N/A 04/21/2015   Procedure: COLONOSCOPY;  Surgeon: Hulen Luster, MD;  Location: [place];  Service: Gastroenterology;  Laterality: N/A;  DIABETIC, DIET CONTROLLED  . POLYPECTOMY N/A 04/21/2015   Procedure: POLYPECTOMY;  Surgeon: Hulen Luster, MD;  Location: [place];  Service: Gastroenterology;  Laterality: N/A;  . TUNNELED VENOUS PORT PLACEMENT      There were no vitals filed for this visit.      Subjective Assessment - 09/16/16 0804    Subjective (P)  Pt. reports back/neck stiffness in the morning.  Pt. able to complete HEP but has difficulty with bridging due to pain (PT discharged ex. at this time).   Pertinent History (P)  hx of colon cancer/chemo, cervical radiculopathy, type 2 diabetes, COPD,cervical discecotmy, HTN   Limitations (P)  Sitting;Reading;Lifting;Standing;Walking;Writing;House hold activities   Patient Stated Goals (P)  Decrease low back pain with functional tasks.   Currently in Pain? (P)  Yes   Pain Score (P)  2    Pain Location (P)  Back   Pain Orientation (P)  Lower;Mid   Pain Descriptors / Indicators (P)  Aching;Tightness        Therex.:  Reviewed HEP.  Scifit L4-5 5 min. B UE/LE.  Supine TrA ex.: hip flexion/ hip abd./ SLR 10x each with cuing for proper TrA ex.  Manual tx.:  Supine LE/lumbar stretches 12 min.  Prone Grade II-III PA mobs. To lumbar region 2x 20 sec. (central/ unilateral).  STM to lumbar paraspinals.  Educated pt. On use of wallet in back pocket.              Plan - 09/17/16 1442    Clinical Presentation Stable   Clinical Decision Making Moderate   Rehab Potential Fair   Clinical Impairments Affecting Rehab Potential hx of cancer/chemo, cervical radiculopathy for years prior to surgery.    PT Frequency 2x / week   PT Duration 4 weeks   PT Treatment/Interventions ADLs/Self Care Home Management;Cryotherapy;DME Instruction;Ultrasound;Traction;Electrical Stimulation;Moist Heat;Iontophoresis 4mg /ml Dexamethasone;Gait training;Stair  training;Functional mobility training;Therapeutic activities;Therapeutic exercise;Balance training;Patient/family education;Neuromuscular re-education;Manual techniques;Scar mobilization;Taping;Splinting;Energy conservation;Passive range of motion   PT Next Visit Plan Lumabar manual tx./ Supine stretches/ Review HEP   PT Home Exercise Plan see handouts.   Consulted and Agree with Plan of Care Patient      Patient will benefit from skilled therapeutic intervention in order to improve the following deficits and impairments:  Abnormal gait, Decreased activity tolerance, Decreased balance, Decreased endurance, Decreased  knowledge of precautions, Decreased mobility, Decreased range of motion, Difficulty walking, Decreased strength, Hypomobility, Impaired flexibility, Impaired perceived functional ability, Impaired sensation, Impaired UE functional use, Postural dysfunction, Improper body mechanics, Pain  Visit Diagnosis: Chronic bilateral low back pain with bilateral sciatica  Joint stiffness  Muscle weakness (generalized)     Problem List Patient Active Problem List   Diagnosis Date Noted  . Malignant neoplasm of sigmoid colon ([facility]) 07/30/2015  . Chronic obstructive pulmonary disease ([place]) 11/01/2014  . H/O disease 11/01/2014  . BP (high blood pressure) 11/01/2014  . Type 2 diabetes mellitus ([place]) 11/01/2014  . Current tobacco use 10/06/2014  . H/O malignant neoplasm of colon 11/11/2010    Pura Spice 09/17/2016, 2:44 PM  [facility] [address]. [place], [place], [address] Phone: [phone]   Fax:  [phone]  Name: Cameron Howe MRN: [id number] Date of Birth: [birth year]

## 2016-09-21 ENCOUNTER — Ambulatory Visit: Payer: Medicare Other | Admitting: Physical Therapy

## 2016-09-21 ENCOUNTER — Encounter: Payer: Self-pay | Admitting: Physical Therapy

## 2016-09-21 DIAGNOSIS — M2560 Stiffness of unspecified joint, not elsewhere classified: Secondary | ICD-10-CM

## 2016-09-21 DIAGNOSIS — G8929 Other chronic pain: Secondary | ICD-10-CM

## 2016-09-21 DIAGNOSIS — M6281 Muscle weakness (generalized): Secondary | ICD-10-CM

## 2016-09-21 DIAGNOSIS — M5441 Lumbago with sciatica, right side: Principal | ICD-10-CM

## 2016-09-21 DIAGNOSIS — M5442 Lumbago with sciatica, left side: Secondary | ICD-10-CM | POA: Diagnosis not present

## 2016-09-21 NOTE — Therapy (Signed)
[facility] [address] [place], [place], [address] Phone: [phone]   Fax:  [phone]  Physical Therapy Treatment  Patient Details  Name: Cameron Howe MRN: [id number] Date of Birth: Jan 03, 1946 Referring Provider: Dr. Izora Ribas  Encounter Date: 09/21/2016      PT End of Session - 09/21/16 0736    Visit Number 3   Number of Visits 8   Date for PT Re-Evaluation Oct 26, 2016   Authorization Type g codes   Authorization - Visit Number 3   Authorization - Number of Visits 10   PT Start Time [phone]   Activity Tolerance Patient tolerated treatment well   Behavior During Therapy [facility] for tasks assessed/performed      Past Medical History:  Diagnosis Date  . Arthritis    SHOULDERS AND JOINTS  . Cancer [facility])    Colon cancer 11/2010 with parital resection with chemo and rad tx  . Dental bridge present    REMOVABLE  . Diabetes mellitus without complication (HCC)    TYPE 2, DIET CONTROLLED  . Hypertension   . Neuromuscular disorder (HCC)    WEAKNESS AND NUMBNESS HANDS AND FEET, POSSIBLY FROM CHEMO    Past Surgical History:  Procedure Laterality Date  . COLON SURGERY  NOV 2012   COLON REMOVED  . COLON SURGERY  APRIL 2014   OSTOMY REMOVED  . COLONOSCOPY N/A 04/21/2015   Procedure: COLONOSCOPY;  Surgeon: Hulen Luster, MD;  Location: [place];  Service: Gastroenterology;  Laterality: N/A;  DIABETIC, DIET CONTROLLED  . POLYPECTOMY N/A 04/21/2015   Procedure: POLYPECTOMY;  Surgeon: Hulen Luster, MD;  Location: [place];  Service: Gastroenterology;  Laterality: N/A;  . TUNNELED VENOUS PORT PLACEMENT      There were no vitals filed for this visit.      Subjective Assessment - 09/21/16 0732    Subjective Pt. states he is feeling better in back/legs since starting PT.  Pt. reports 3-4/10 at worst since last tx. session.     Pertinent History hx of colon cancer/chemo, cervical radiculopathy, type 2 diabetes,  COPD,cervical discecotmy, HTN   Limitations Sitting;Lifting;Standing;Walking;House hold activities   Patient Stated Goals Decrease low back pain with functional tasks.   Currently in Pain? Yes   Pain Score 3    Pain Location Back   Pain Orientation Lower;Mid   Pain Descriptors / Indicators Aching         Therex.:  Standing hamstring stretches at 2nd step 2x each 30 sec. Holds.  Scifit L5 10 min. B UE/LE.  Supine TrA ex.: hip flexion/ hip abd./ SLR 10x each with cuing for proper TrA ex.  Manual tx.:  Supine LE/lumbar stretches 12 min.  Prone Grade II-III PA mobs. To lumbar region 2x 20 sec. (central/ unilateral).  STM to lumbar paraspinals.           Plan - 09/21/16 0737    Clinical Presentation Stable   Clinical Decision Making Moderate   Rehab Potential Fair   Clinical Impairments Affecting Rehab Potential hx of cancer/chemo, cervical radiculopathy for years prior to surgery.    PT Frequency 2x / week   PT Duration 4 weeks   PT Treatment/Interventions ADLs/Self Care Home Management;Cryotherapy;DME Instruction;Ultrasound;Traction;Electrical Stimulation;Moist Heat;Iontophoresis 4mg /ml Dexamethasone;Gait training;Stair training;Functional mobility training;Therapeutic activities;Therapeutic exercise;Balance training;Patient/family education;Neuromuscular re-education;Manual techniques;Scar mobilization;Taping;Splinting;Energy conservation;Passive range of motion   PT Next Visit Plan Lumabar manual tx./ Supine stretches   PT Home Exercise Plan see handouts.   Consulted and Agree  with Plan of Care Patient      Patient will benefit from skilled therapeutic intervention in order to improve the following deficits and impairments:  Abnormal gait, Decreased activity tolerance, Decreased balance, Decreased endurance, Decreased knowledge of precautions, Decreased mobility, Decreased range of motion, Difficulty walking, Decreased strength, Hypomobility, Impaired flexibility, Impaired perceived  functional ability, Impaired sensation, Impaired UE functional use, Postural dysfunction, Improper body mechanics, Pain  Visit Diagnosis: Chronic bilateral low back pain with bilateral sciatica  Joint stiffness  Muscle weakness (generalized)     Problem List Patient Active Problem List   Diagnosis Date Noted  . Malignant neoplasm of sigmoid colon ([place]) 07/30/2015  . Chronic obstructive pulmonary disease ([place]) 11/01/2014  . H/O disease 11/01/2014  . BP (high blood pressure) 11/01/2014  . Type 2 diabetes mellitus ([place]) 11/01/2014  . Current tobacco use 10/06/2014  . H/O malignant neoplasm of colon 11/11/2010    Pura Spice 09/21/2016, 7:38 AM  [facility] [address] [place], [place], [address] Phone: [phone]   Fax:  [phone]  Name: Cameron Howe MRN: [id number] Date of Birth: 08-11-1945

## 2016-09-27 NOTE — Addendum Note (Signed)
Addended by: Dorcas Carrow C on: 09/27/2016 06:30 PM   Modules accepted: Orders

## 2016-09-28 ENCOUNTER — Encounter: Payer: Self-pay | Admitting: Physical Therapy

## 2016-09-28 ENCOUNTER — Ambulatory Visit: Payer: Medicare Other | Admitting: Physical Therapy

## 2016-09-28 DIAGNOSIS — G8929 Other chronic pain: Secondary | ICD-10-CM

## 2016-09-28 DIAGNOSIS — M2560 Stiffness of unspecified joint, not elsewhere classified: Secondary | ICD-10-CM

## 2016-09-28 DIAGNOSIS — M6281 Muscle weakness (generalized): Secondary | ICD-10-CM

## 2016-09-28 DIAGNOSIS — M5441 Lumbago with sciatica, right side: Principal | ICD-10-CM

## 2016-09-28 DIAGNOSIS — M5442 Lumbago with sciatica, left side: Secondary | ICD-10-CM | POA: Diagnosis not present

## 2016-09-28 NOTE — Therapy (Signed)
[facility] [address] [place], [place], [address] Phone: [phone]   Fax:  [phone]  Physical Therapy Treatment  Patient Details  Name: Cameron Howe MRN: [id number] Date of Birth: [birth year] Referring Provider: Dr. Izora Ribas  Encounter Date: 09/28/2016  Treatment 4 of 8.  Recert 07/06/01.   Past Medical History:  Diagnosis Date  . Arthritis    SHOULDERS AND JOINTS  . Cancer [facility])    Colon cancer 11/2010 with parital resection with chemo and rad tx  . Dental bridge present    REMOVABLE  . Diabetes mellitus without complication (HCC)    TYPE 2, DIET CONTROLLED  . Hypertension   . Neuromuscular disorder (HCC)    WEAKNESS AND NUMBNESS HANDS AND FEET, POSSIBLY FROM CHEMO    Past Surgical History:  Procedure Laterality Date  . COLON SURGERY  NOV 2012   COLON REMOVED  . COLON SURGERY  APRIL 2014   OSTOMY REMOVED  . COLONOSCOPY N/A 04/21/2015   Procedure: COLONOSCOPY;  Surgeon: Hulen Luster, MD;  Location: [place];  Service: Gastroenterology;  Laterality: N/A;  DIABETIC, DIET CONTROLLED  . POLYPECTOMY N/A 04/21/2015   Procedure: POLYPECTOMY;  Surgeon: Hulen Luster, MD;  Location: [place];  Service: Gastroenterology;  Laterality: N/A;  . TUNNELED VENOUS PORT PLACEMENT      There were no vitals filed for this visit.    Pt. c/o stiffness in low back but doing well.  Pt. reports 3/10 low back discomfort at this time.       There.ex.:    Scifit L5 10 min. B UE/LE (cuing for posture/ core muscle control)- no increase c/o back pain.   Standing lumbar AROM (5x) all planes in pain tolerable range.   Standing hamstring stretches at 2nd step 5x each each with static holds.  Supine core ex.: marching/ bolster bridges (modified due to pain)- PT discussed spinal stenosis/ hip abd. With GTB 20x each.  Reviewed progressive HEP.   Manual tx.:    Supine LE/ lumbar stretches (16 min.).  No prone mobs today.          Pt. demonstrates good technique with HEP and understands importance of movement to decrease stiffness/ pain.  Pt. instructed on proper body posture/ mechanics with yard work.  Pt. will continue with current progressive HEP and return to PT on 10/19/16 for f/u.  Pt. instructed to contact PT if any worsening of pain symptoms.             PT Long Term Goals - 10/02/16 1816      PT LONG TERM GOAL #1   Title Pt. will decrease MODI to <20% to improve perceived disability/ improved quality of life.     Baseline 8/30:  32%   Time 4   Period Weeks   Status On-going   Target Date 10/06/16     PT LONG TERM GOAL #2   Title Pt. will be independent with HEP to increase core stability/ LE muscle flexibility to [facility] to improve functional mobility with yardwork/ daily tasks.    Baseline Pt. doing well with HEP and no questions at this time.    Time 4   Period Weeks   Status Partially Met   Target Date 10/06/16     PT LONG TERM GOAL #3   Title Pt. will report no lumbar/LE radicular symptoms consistently for a week to improve mobility.     Baseline B LE radicular symptoms with increase  standing/ daily tasks.    Time 4   Period Weeks   Status On-going   Target Date 10/06/16             Patient will benefit from skilled therapeutic intervention in order to improve the following deficits and impairments:  Abnormal gait, Decreased activity tolerance, Decreased balance, Decreased endurance, Decreased knowledge of precautions, Decreased mobility, Decreased range of motion, Difficulty walking, Decreased strength, Hypomobility, Impaired flexibility, Impaired perceived functional ability, Impaired sensation, Impaired UE functional use, Postural dysfunction, Improper body mechanics, Pain  Visit Diagnosis: Chronic bilateral low back pain with bilateral sciatica  Joint stiffness  Muscle weakness (generalized)     Problem List Patient Active Problem List   Diagnosis Date Noted   . Malignant neoplasm of sigmoid colon ([facility]) 07/30/2015  . Chronic obstructive pulmonary disease ([place]) 11/01/2014  . H/O disease 11/01/2014  . BP (high blood pressure) 11/01/2014  . Type 2 diabetes mellitus ([place]) 11/01/2014  . Current tobacco use 10/06/2014  . H/O malignant neoplasm of colon 11/11/2010   Pura Spice, PT, DPT # [id number] 10/02/2016, 6:18 PM  [facility] [address] [place], [place], [address] Phone: [phone]   Fax:  [phone]  Name: Cameron Howe MRN: [id number] Date of Birth: February 24, 1945

## 2016-09-30 ENCOUNTER — Inpatient Hospital Stay: Payer: Medicare Other | Attending: Oncology

## 2016-09-30 DIAGNOSIS — Z923 Personal history of irradiation: Secondary | ICD-10-CM | POA: Insufficient documentation

## 2016-09-30 DIAGNOSIS — Z95828 Presence of other vascular implants and grafts: Secondary | ICD-10-CM

## 2016-09-30 DIAGNOSIS — Z452 Encounter for adjustment and management of vascular access device: Secondary | ICD-10-CM | POA: Diagnosis not present

## 2016-09-30 DIAGNOSIS — Z85038 Personal history of other malignant neoplasm of large intestine: Secondary | ICD-10-CM | POA: Insufficient documentation

## 2016-09-30 DIAGNOSIS — Z9221 Personal history of antineoplastic chemotherapy: Secondary | ICD-10-CM | POA: Insufficient documentation

## 2016-09-30 MED ORDER — HEPARIN SOD (PORK) LOCK FLUSH 100 UNIT/ML IV SOLN
500.0000 [IU] | Freq: Once | INTRAVENOUS | Status: AC
  Administered 2016-09-30: 500 [IU] via INTRAVENOUS

## 2016-09-30 MED ORDER — SODIUM CHLORIDE 0.9% FLUSH
10.0000 mL | INTRAVENOUS | Status: DC | PRN
  Administered 2016-09-30: 10 mL via INTRAVENOUS
  Filled 2016-09-30: qty 10

## 2016-10-11 ENCOUNTER — Telehealth: Payer: Self-pay | Admitting: *Deleted

## 2016-10-11 NOTE — Telephone Encounter (Signed)
Received referral for low dose lung cancer screening CT scan. Message left at phone number listed in EMR for patient to call me back to facilitate scheduling scan.  

## 2016-10-19 ENCOUNTER — Encounter: Payer: Self-pay | Admitting: Physical Therapy

## 2016-10-19 ENCOUNTER — Telehealth: Payer: Self-pay | Admitting: *Deleted

## 2016-10-19 ENCOUNTER — Ambulatory Visit: Payer: Medicare Other | Attending: Neurosurgery | Admitting: Physical Therapy

## 2016-10-19 DIAGNOSIS — M5441 Lumbago with sciatica, right side: Secondary | ICD-10-CM | POA: Insufficient documentation

## 2016-10-19 DIAGNOSIS — G8929 Other chronic pain: Secondary | ICD-10-CM

## 2016-10-19 DIAGNOSIS — M2560 Stiffness of unspecified joint, not elsewhere classified: Secondary | ICD-10-CM | POA: Diagnosis present

## 2016-10-19 DIAGNOSIS — M6281 Muscle weakness (generalized): Secondary | ICD-10-CM | POA: Insufficient documentation

## 2016-10-19 DIAGNOSIS — M5442 Lumbago with sciatica, left side: Secondary | ICD-10-CM | POA: Diagnosis not present

## 2016-10-19 NOTE — Telephone Encounter (Signed)
Received referral for low dose lung cancer screening CT scan. Message left at phone number listed in EMR for patient to call me back to facilitate scheduling scan.  

## 2016-10-19 NOTE — Therapy (Signed)
[facility] [address]. [place], [place], [address] Phone: [phone]   Fax:  [phone]  Physical Therapy Treatment/Progress Note  Patient Details  Name: Cameron Howe MRN: [id number] Date of Birth: [birth year] Referring Provider: Dr. Myer Haff  Encounter Date: 10/19/2016 Dictation #1 [id number]  [id number]      PT End of Session - 10/19/16 0857    Visit Number 5   Number of Visits 12   Date for PT Re-Evaluation 12/11/16   Authorization Type g codes   Authorization - Visit Number 5   Authorization - Number of Visits 10   PT Start Time 0729   PT Stop Time 0817   PT Time Calculation (min) 48 min   Activity Tolerance Patient tolerated treatment well;Patient limited by pain   Behavior During Therapy [facility] for tasks assessed/performed      Past Medical History:  Diagnosis Date  . Arthritis    SHOULDERS AND JOINTS  . Cancer [facility])    Colon cancer 11/2010 with parital resection with chemo and rad tx  . Dental bridge present    REMOVABLE  . Diabetes mellitus without complication (HCC)    TYPE 2, DIET CONTROLLED  . Hypertension   . Neuromuscular disorder (HCC)    WEAKNESS AND NUMBNESS HANDS AND FEET, POSSIBLY FROM CHEMO    Past Surgical History:  Procedure Laterality Date  . COLON SURGERY  NOV 2012   COLON REMOVED  . COLON SURGERY  APRIL 2014   OSTOMY REMOVED  . COLONOSCOPY N/A 04/21/2015   Procedure: COLONOSCOPY;  Surgeon: Wallace Cullens, MD;  [facility];  Service: Gastroenterology;  Laterality: N/A;  DIABETIC, DIET CONTROLLED  . POLYPECTOMY N/A 04/21/2015   Procedure: POLYPECTOMY;  Surgeon: Wallace Cullens, MD;  [facility];  Service: Gastroenterology;  Laterality: N/A;  . TUNNELED VENOUS PORT PLACEMENT      There were no vitals filed for this visit.      Subjective Assessment - 10/19/16 0730    Subjective Pt reports he is still having LBP with some radicular symptoms bilaterally  recently. Pt is still having pain/stiffness following yard work and manual labor. Pt has follow-up with PCP on 11/03/16   Pertinent History hx of colon cancer/chemo, cervical radiculopathy, type 2 diabetes, COPD,cervical discecotmy, HTN   Limitations Sitting;Lifting;Standing;Walking;House hold activities   Patient Stated Goals Decrease low back pain with functional tasks.   Currently in Pain? No/denies        Treatment:  Goals Re-assessed:  Lumbar/hip muscle tightness  MODI: previously 32% on 8/30 and now 26% (slight improvement).    Radicular symptoms during previous week: Pt reports radicular symptoms during last week bilaterally   Ther-ex:     Warm-up (un-billed) Scifit L6 B UE/LE x 10 min Review HEP Resisted gait with 2BTB in //- Bars x 4 laps down and back in forward, backward, and bilaterally sidestepping (1BTB) each with cues for TA activation   Manual therapy:     Supine LE/ lumbar stretches: hamstring stretch 3 x 30 sec each side in hooklying (pt with mild discomfort with stretch); nerve glide with ankle DF in SLR position x 30 sex each LE; single knee to chest x 2 with 5-10 sec hold each LE; lumbar rotational stretch in hooklying 2 x each LE with 10-15 sec hold; double knee to chest with deep breathing; dead bug 2 x 10 each alternating contralateral UE,LE with cues for continued breathing Sitting STM to lumbar paraspinals (minimal  to no tenderness reported).        PT Long Term Goals - Nov 15, 2016 0737      PT LONG TERM GOAL #1   Title Pt. will decrease MODI to <20% to improve perceived disability/ improved quality of life.     Baseline 8/30:  32%; 10/10: 26%   Time 4   Period Weeks   Status Partially Met   Target Date 11/16/16     PT LONG TERM GOAL #2   Title Pt. will be independent with HEP to increase core stability/ LE muscle flexibility to [facility] to improve functional mobility with yardwork/ daily tasks.    Baseline Pt. doing well with HEP; he performs them almost  everyday, with exception of bridging due to pain   Time 4   Period Weeks   Status Partially Met   Target Date 11/16/16     PT LONG TERM GOAL #3   Title Pt. will report no lumbar/LE radicular symptoms consistently for a week to improve mobility.     Baseline B LE radicular symptoms in the last few days with standing/ yard work tasks.    Time 4   Period Weeks   Status Partially Met   Target Date 11/16/16               Plan - 11/15/16 0805    Clinical Impression Statement Pt has been adherent to his HEP. He continues to have mild persistent LBP and radicular symptoms recently. Pt symptoms have improved slightly and he is able to perform his yard work with only minor increase in symptoms. Pt will will continue therapy at 1/wk to monitor progress and advance HEP to prevent increase in back pain and to maximize pt's functional mobility.   Clinical Presentation Stable   Clinical Decision Making Moderate   Rehab Potential Fair   Clinical Impairments Affecting Rehab Potential hx of cancer/chemo, cervical radiculopathy for years prior to surgery.    PT Frequency 1x / week   PT Duration 4 weeks   PT Treatment/Interventions ADLs/Self Care Home Management;Cryotherapy;DME Instruction;Ultrasound;Traction;Electrical Stimulation;Moist Heat;Iontophoresis '4mg'$ /ml Dexamethasone;Gait training;Stair training;Functional mobility training;Therapeutic activities;Therapeutic exercise;Balance training;Patient/family education;Neuromuscular re-education;Manual techniques;Scar mobilization;Taping;Splinting;Energy conservation;Passive range of motion   PT Next Visit Plan Pt. returns on 2022/11/16 for f/u and goal reassessment   PT Home Exercise Plan see handouts.   Consulted and Agree with Plan of Care Patient      Patient will benefit from skilled therapeutic intervention in order to improve the following deficits and impairments:  Abnormal gait, Decreased activity tolerance, Decreased balance, Decreased  endurance, Decreased knowledge of precautions, Decreased mobility, Decreased range of motion, Difficulty walking, Decreased strength, Hypomobility, Impaired flexibility, Impaired perceived functional ability, Impaired sensation, Impaired UE functional use, Postural dysfunction, Improper body mechanics, Pain  Visit Diagnosis: Chronic bilateral low back pain with bilateral sciatica  Joint stiffness  Muscle weakness (generalized)       G-Codes - 15-Nov-2016 0924    Functional Assessment Tool Used (Outpatient Only) MODI/ muscle weakness/ LEFS/ joint stiffness/ pain   Functional Limitation Changing and maintaining body position   Changing and Maintaining Body Position Current Status (P7106) At least 20 percent but less than 40 percent impaired, limited or restricted   Changing and Maintaining Body Position Goal Status (Y6948) At least 1 percent but less than 20 percent impaired, limited or restricted      Problem List Patient Active Problem List   Diagnosis Date Noted  . Malignant neoplasm of sigmoid colon ([place]) 07/30/2015  . Chronic obstructive  pulmonary disease ([place]) 11/01/2014  . H/O disease 11/01/2014  . BP (high blood pressure) 11/01/2014  . Type 2 diabetes mellitus ([place]) 11/01/2014  . Current tobacco use 10/06/2014  . H/O malignant neoplasm of colon 11/11/2010   Burnett Corrente, SPT Pura Spice, PT, DPT # [phone] 10/19/2016, 9:25 AM  [facility] [address] [place], [place], [address] Phone: [phone]   Fax:  [phone]  Name: Cameron Howe MRN: [id number] Date of Birth: 1945/03/26

## 2016-10-28 ENCOUNTER — Ambulatory Visit: Payer: Medicare Other | Admitting: Physical Therapy

## 2016-10-28 DIAGNOSIS — M5442 Lumbago with sciatica, left side: Secondary | ICD-10-CM | POA: Diagnosis not present

## 2016-10-28 DIAGNOSIS — M5441 Lumbago with sciatica, right side: Principal | ICD-10-CM

## 2016-10-28 DIAGNOSIS — M2560 Stiffness of unspecified joint, not elsewhere classified: Secondary | ICD-10-CM

## 2016-10-28 DIAGNOSIS — G8929 Other chronic pain: Secondary | ICD-10-CM

## 2016-10-28 DIAGNOSIS — M6281 Muscle weakness (generalized): Secondary | ICD-10-CM

## 2016-10-28 NOTE — Therapy (Signed)
[facility] [address]. [place], [place], [address] Phone: [phone]   Fax:  [phone]  Physical Therapy Treatment  Patient Details  Name: Cameron Howe MRN: [id number] Date of Birth: 21-Oct-1945 Referring Provider: Dr. Izora Ribas  Encounter Date: 10/28/2016      PT End of Session - 10/29/16 1426    Visit Number 6   Number of Visits 12   Date for PT Re-Evaluation 11-Dec-2016   Authorization Type g codes   Authorization - Visit Number 6   Authorization - Number of Visits 10   PT Start Time 0931   PT Stop Time 1022   PT Time Calculation (min) 51 min   Activity Tolerance Patient tolerated treatment well;Patient limited by pain   Behavior During Therapy [facility] for tasks assessed/performed      Past Medical History:  Diagnosis Date  . Arthritis    SHOULDERS AND JOINTS  . Cancer [facility])    Colon cancer 11/2010 with parital resection with chemo and rad tx  . Dental bridge present    REMOVABLE  . Diabetes mellitus without complication (HCC)    TYPE 2, DIET CONTROLLED  . Hypertension   . Neuromuscular disorder (HCC)    WEAKNESS AND NUMBNESS HANDS AND FEET, POSSIBLY FROM CHEMO    Past Surgical History:  Procedure Laterality Date  . COLON SURGERY  NOV 2012   COLON REMOVED  . COLON SURGERY  APRIL 2014   OSTOMY REMOVED  . COLONOSCOPY N/A 04/21/2015   Procedure: COLONOSCOPY;  Surgeon: Hulen Luster, MD;  Location: [place];  Service: Gastroenterology;  Laterality: N/A;  DIABETIC, DIET CONTROLLED  . POLYPECTOMY N/A 04/21/2015   Procedure: POLYPECTOMY;  Surgeon: Hulen Luster, MD;  Location: [place];  Service: Gastroenterology;  Laterality: N/A;  . TUNNELED VENOUS PORT PLACEMENT      There were no vitals filed for this visit.      Subjective Assessment - 10/29/16 1425    Pertinent History hx of colon cancer/chemo, cervical radiculopathy, type 2 diabetes, COPD,cervical discecotmy, HTN   Limitations  Sitting;Lifting;Standing;Walking;House hold activities   Patient Stated Goals Decrease low back pain with functional tasks.   Currently in Pain? No/denies        Treatment:  Ther-ex:   Warm-up (un-billed) Scifit L6 B UE/LE x 10 min Resisted gait with 2BTB in //- Bars x 4 laps down and back in forward, backward, and bilaterally sidestepping (1BTB) each with cues for TA activation   Manual therapy:   Supine LE/ lumbar stretches: hamstring stretch 3 x 30 sec each side in hooklying (pt with mild discomfort with stretch); nerve glide with ankle DF in SLR position x 30 sex each LE; single knee to chest x 2 with 5-10 sec hold each LE; lumbar rotational stretch in hooklying 2 x each LE with 10-15 sec hold; double knee to chest with deep breathing; dead bug 2 x 10 each alternating contralateral UE,LE with cues for continued breathing Sitting STM to lumbar paraspinals (minimal to no tenderness reported).          PT Long Term Goals - 10/19/16 0737      PT LONG TERM GOAL #1   Title Pt. will decrease MODI to <20% to improve perceived disability/ improved quality of life.     Baseline 8/30:  32%; 10/10: 26%   Time 4   Period Weeks   Status Partially Met   Target Date 2016-12-11     PT LONG  TERM GOAL #2   Title Pt. will be independent with HEP to increase core stability/ LE muscle flexibility to [facility] to improve functional mobility with yardwork/ daily tasks.    Baseline Pt. doing well with HEP; he performs them almost everyday, with exception of bridging due to pain   Time 4   Period Weeks   Status Partially Met   Target Date 11/16/16     PT LONG TERM GOAL #3   Title Pt. will report no lumbar/LE radicular symptoms consistently for a week to improve mobility.     Baseline B LE radicular symptoms in the last few days with standing/ yard work tasks.    Time 4   Period Weeks   Status Partially Met   Target Date 11/16/16               Plan - 10/29/16 1427    Clinical  Presentation Stable   Clinical Decision Making Moderate   Rehab Potential Fair   PT Frequency 1x / week   PT Duration 4 weeks   PT Treatment/Interventions ADLs/Self Care Home Management;Cryotherapy;DME Instruction;Ultrasound;Traction;Electrical Stimulation;Moist Heat;Iontophoresis 65m/ml Dexamethasone;Gait training;Stair training;Functional mobility training;Therapeutic activities;Therapeutic exercise;Balance training;Patient/family education;Neuromuscular re-education;Manual techniques;Scar mobilization;Taping;Splinting;Energy conservation;Passive range of motion   PT Next Visit Plan Pt. will call after MD f/u.  ?injections.    PT Home Exercise Plan see handouts.   Consulted and Agree with Plan of Care Patient      Patient will benefit from skilled therapeutic intervention in order to improve the following deficits and impairments:  Abnormal gait, Decreased activity tolerance, Decreased balance, Decreased endurance, Decreased knowledge of precautions, Decreased mobility, Decreased range of motion, Difficulty walking, Decreased strength, Hypomobility, Impaired flexibility, Impaired perceived functional ability, Impaired sensation, Impaired UE functional use, Postural dysfunction, Improper body mechanics, Pain  Visit Diagnosis: Chronic bilateral low back pain with bilateral sciatica  Joint stiffness  Muscle weakness (generalized)     Problem List Patient Active Problem List   Diagnosis Date Noted  . Malignant ne[place]moid colon ([place] 07/30/2015  . Chronic obstructive pulmonary disease ([place] 11/01/2014  . H/O disease 11/01/2014  . BP (high blood pressure) 11/01/2014  . Type 2 diabetes mellitus ([place] 11/01/2014  . Current tobacco use 10/06/2014  . H/O malignant neoplasm of colon 11/11/2010    SPura Spice10/20/2018, 2:29 PM  [place] [facility] [place] [address]Phone: [phone]  Fax:   [phone] Name: Cameron RUNYONMRN: [id number] of Birth: 1Jan 05, 1947

## 2016-11-11 ENCOUNTER — Inpatient Hospital Stay: Payer: Medicare Other | Attending: Oncology

## 2016-11-29 ENCOUNTER — Encounter: Payer: Self-pay | Admitting: *Deleted

## 2016-12-23 ENCOUNTER — Inpatient Hospital Stay: Payer: Medicare Other | Attending: Oncology

## 2016-12-23 DIAGNOSIS — Z452 Encounter for adjustment and management of vascular access device: Secondary | ICD-10-CM | POA: Insufficient documentation

## 2016-12-23 DIAGNOSIS — Z85038 Personal history of other malignant neoplasm of large intestine: Secondary | ICD-10-CM | POA: Insufficient documentation

## 2016-12-23 DIAGNOSIS — Z95828 Presence of other vascular implants and grafts: Secondary | ICD-10-CM

## 2016-12-23 MED ORDER — HEPARIN SOD (PORK) LOCK FLUSH 100 UNIT/ML IV SOLN
500.0000 [IU] | Freq: Once | INTRAVENOUS | Status: AC
  Administered 2016-12-23: 500 [IU] via INTRAVENOUS

## 2016-12-23 MED ORDER — SODIUM CHLORIDE 0.9% FLUSH
10.0000 mL | INTRAVENOUS | Status: DC | PRN
  Administered 2016-12-23: 10 mL via INTRAVENOUS
  Filled 2016-12-23: qty 10

## 2017-02-03 ENCOUNTER — Inpatient Hospital Stay: Payer: Medicare Other

## 2017-02-17 ENCOUNTER — Inpatient Hospital Stay: Payer: Medicare Other | Attending: Oncology

## 2017-02-17 VITALS — BP 169/93 | HR 101 | Temp 97.6°F | Resp 20

## 2017-02-17 DIAGNOSIS — Z85038 Personal history of other malignant neoplasm of large intestine: Secondary | ICD-10-CM | POA: Insufficient documentation

## 2017-02-17 DIAGNOSIS — Z452 Encounter for adjustment and management of vascular access device: Secondary | ICD-10-CM | POA: Insufficient documentation

## 2017-02-17 DIAGNOSIS — Z95828 Presence of other vascular implants and grafts: Secondary | ICD-10-CM

## 2017-02-17 MED ORDER — SODIUM CHLORIDE 0.9% FLUSH
10.0000 mL | INTRAVENOUS | Status: DC | PRN
  Administered 2017-02-17: 10 mL via INTRAVENOUS
  Filled 2017-02-17: qty 10

## 2017-02-17 MED ORDER — HEPARIN SOD (PORK) LOCK FLUSH 100 UNIT/ML IV SOLN
500.0000 [IU] | Freq: Once | INTRAVENOUS | Status: AC
  Administered 2017-02-17: 500 [IU] via INTRAVENOUS

## 2017-03-31 ENCOUNTER — Inpatient Hospital Stay: Payer: Medicare Other | Attending: Oncology

## 2017-04-11 ENCOUNTER — Telehealth: Payer: Self-pay | Admitting: *Deleted

## 2017-04-11 NOTE — Telephone Encounter (Signed)
Received referral for low dose lung cancer screening CT scan. Message left at phone number listed in EMR for patient to call me back to facilitate scheduling scan.  

## 2017-04-12 ENCOUNTER — Telehealth: Payer: Self-pay | Admitting: *Deleted

## 2017-04-12 NOTE — Telephone Encounter (Signed)
Received referral for initial lung cancer screening scan. Contacted patient who would like to wait until May for screening due to medical oncology follow up and anticipation of imaging related to prior colon cancer. Patient is aware that this is likely different imaging than screening.

## 2017-05-12 ENCOUNTER — Inpatient Hospital Stay: Payer: Medicare Other | Attending: Oncology

## 2017-06-13 ENCOUNTER — Telehealth: Payer: Self-pay | Admitting: *Deleted

## 2017-06-13 NOTE — Telephone Encounter (Signed)
Received referral for low dose lung cancer screening CT scan. Message left at phone number listed in EMR for patient to call me back to facilitate scheduling scan.  

## 2017-06-16 ENCOUNTER — Encounter: Payer: Self-pay | Admitting: *Deleted

## 2017-06-23 ENCOUNTER — Inpatient Hospital Stay: Payer: Medicare Other | Attending: Oncology

## 2017-08-14 NOTE — Progress Notes (Signed)
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  INTERVAL HISTORY: Patient returns to clinic today for routine yearly evaluation.  He continues to feel well and asymptomatic.  He does admit to occasional back pain which is chronic in nature since his surgery. He has no neurologic complaints. He denies any recent fevers or illnesses. He has a good appetite and denies weight loss. He has no chest pain or shortness of breath. He denies any nausea, vomiting, constipation, or diarrhea. He has noted no changes in his bowel movements and denies any melena or hematochezia. He has no urinary complaints.  Patient offers no further specific complaints today.  REVIEW OF SYSTEMS:   Review of Systems  Constitutional: Negative.  Negative for fever, malaise/fatigue and weight loss.  Respiratory: Negative.  Negative for cough and shortness of breath.   Cardiovascular: Negative.  Negative for chest pain and leg swelling.  Gastrointestinal: Negative.  Negative for abdominal pain, blood in stool, constipation, diarrhea, melena, nausea and vomiting.  Genitourinary: Negative.   Musculoskeletal: Positive for back pain.  Skin: Negative.  Negative for rash.  Neurological: Negative.  Negative for focal weakness, weakness and headaches.  Psychiatric/Behavioral: Negative.  The patient is not nervous/anxious.     As per HPI. Otherwise, a complete review of systems is negative.  ONCOLOGY HISTORY:  No history exists.    PAST MEDICAL HISTORY: Past Medical History:  Diagnosis Date  . Arthritis    SHOULDERS AND JOINTS  . Cancer [facility])    Colon cancer 11/2010 with parital resection with chemo and rad tx  . Dental bridge present    REMOVABLE  . Diabetes mellitus  without complication (HCC)    TYPE 2, DIET CONTROLLED  . Hypertension   . Neuromuscular disorder ([place])    WEAKNESS AND NUMBNESS HANDS AND FEET, POSSIBLY FROM CHEMO    PAST SURGICAL HISTORY: Past Surgical History:  Procedure Laterality Date  . COLON SURGERY  NOV 2012   COLON REMOVED  . COLON SURGERY  APRIL 2014   OSTOMY REMOVED  . COLONOSCOPY N/A 04/21/2015   Procedure: COLONOSCOPY;  Surgeon: Hulen Luster, MD;  Location: [place];  Service: Gastroenterology;  Laterality: N/A;  DIABETIC, DIET CONTROLLED  . POLYPECTOMY N/A 04/21/2015   Procedure: POLYPECTOMY;  Surgeon: Hulen Luster, MD;  Location: [place];  Service: Gastroenterology;  Laterality: N/A;  . TUNNELED VENOUS PORT PLACEMENT      FAMILY HISTORY: Reviewed and unchanged. No reported history of malignancy or chronic disease.   ADVANCED DIRECTIVES:    HEALTH MAINTENANCE: Social History   Tobacco Use  . Smoking status: Current Every Day Smoker    Packs/day: 0.50    Years: 30.00    Pack years: 15.00    Types: Cigarettes  . Smokeless tobacco: Never Used  Substance Use Topics  . Alcohol use: No    Alcohol/week: 0.0 standard drinks  . Drug use: No    No Known Allergies  Current Outpatient Medications  Medication Sig Dispense Refill  . acetaminophen (TYLENOL) 650 MG CR tablet Take 650 mg by mouth every 8 (eight) hours as needed for pain.    Marland Kitchen aspirin 81 MG tablet Take 81 mg by mouth daily. 800AM    . hydrochlorothiazide (HYDRODIURIL) 12.5 MG tablet Take  12.5 mg by mouth daily.    Marland Kitchen losartan (COZAAR) 100 MG tablet Take 100 mg by mouth daily.     No current facility-administered medications for this visit.    Facility-Administered Medications Ordered in Other Visits  Medication Dose Route Frequency Provider Last Rate Last Dose  . sodium chloride flush (NS) 0.9 % injection 10 mL  10 mL Intravenous PRN Lloyd Huger, MD   10 mL at 08/18/17 0931    OBJECTIVE: BP (!) 153/71   Pulse 79   Temp  (!) 97.2 F (36.2 C) (Tympanic)   Wt 202 lb 9.6 oz (91.9 kg)   BMI 27.48 kg/m    Body mass index is 27.48 kg/m.    ECOG FS:0 - Asymptomatic  General: Well-developed, well-nourished, no acute distress. Eyes: Pink conjunctiva, anicteric sclera. HEENT: Normocephalic, moist mucous membranes. Lungs: Clear to auscultation bilaterally. Heart: Regular rate and rhythm. No rubs, murmurs, or gallops. Abdomen: Soft, nontender, nondistended. No organomegaly noted, normoactive bowel sounds. Musculoskeletal: No edema, cyanosis, or clubbing. Neuro: Alert, answering all questions appropriately. Cranial nerves grossly intact. Skin: No rashes or petechiae noted. Psych: Normal affect.   LAB RESULTS:  Infusion on 08/18/2017  Component Date Value Ref Range Status  . WBC 08/18/2017 3.9  3.8 - 10.6 K/uL Final  . RBC 08/18/2017 5.76  4.40 - 5.90 MIL/uL Final  . Hemoglobin 08/18/2017 16.8  13.0 - 18.0 g/dL Final  . HCT 08/18/2017 51.0  40.0 - 52.0 % Final  . MCV 08/18/2017 88.6  80.0 - 100.0 fL Final  . MCH 08/18/2017 29.2  26.0 - 34.0 pg Final  . MCHC 08/18/2017 33.0  32.0 - 36.0 g/dL Final  . RDW 08/18/2017 16.1* 11.5 - 14.5 % Final  . Platelets 08/18/2017 155  150 - 440 K/uL Final  . Neutrophils Relative % 08/18/2017 55  % Final  . Neutro Abs 08/18/2017 2.2  1.4 - 6.5 K/uL Final  . Lymphocytes Relative 08/18/2017 29  % Final  . Lymphs Abs 08/18/2017 1.1  1.0 - 3.6 K/uL Final  . Monocytes Relative 08/18/2017 10  % Final  . Monocytes Absolute 08/18/2017 0.4  0.2 - 1.0 K/uL Final  . Eosinophils Relative 08/18/2017 4  % Final  . Eosinophils Absolute 08/18/2017 0.2  0 - 0.7 K/uL Final  . Basophils Relative 08/18/2017 2  % Final  . Basophils Absolute 08/18/2017 0.1  0 - 0.1 K/uL Final   Performed at [facility], [address], [place], [place] [address]    STUDIES: No results found.  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   PLAN:    1.  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 He subsequently underwent partial colectomy and adjuvant chemotherapy which was completed on July 15, 2011. He then completed 5-FU and XRT in October 2013. His most recent colonoscopy on April 21, 2015 was reported as negative. No evidence of disease.  Patient's next colonoscopy will be due in April 2020 and a referral has been made to GI.  His CEA previously was mildly elevated at 5.3, today's result is pending.  No intervention is needed at this time.  Return to clinic in 6 months for laboratory work only and then in 1 year for laboratory work and further evaluation.  If colonoscopy is normal and CEA remains stable, patient likely can be discharged from clinic.  I spent a total of 20 minutes face-to-face with the patient of which  greater than 50% of the visit was spent in counseling and coordination of care as detailed above.   Patient expressed understanding and was in agreement with this plan. He also understands that He can call clinic at any time with any questions, concerns, or complaints.    Lloyd Huger, MD   08/18/2017 10:46 AM

## 2017-08-18 ENCOUNTER — Inpatient Hospital Stay: Payer: Medicare Other | Attending: Oncology | Admitting: Oncology

## 2017-08-18 ENCOUNTER — Encounter: Payer: Self-pay | Admitting: Oncology

## 2017-08-18 ENCOUNTER — Inpatient Hospital Stay: Payer: Medicare Other

## 2017-08-18 VITALS — BP 153/71 | HR 79 | Temp 97.2°F | Wt 202.6 lb

## 2017-08-18 DIAGNOSIS — Z9049 Acquired absence of other specified parts of digestive tract: Secondary | ICD-10-CM | POA: Diagnosis not present

## 2017-08-18 DIAGNOSIS — F17210 Nicotine dependence, cigarettes, uncomplicated: Secondary | ICD-10-CM

## 2017-08-18 DIAGNOSIS — Z9221 Personal history of antineoplastic chemotherapy: Secondary | ICD-10-CM | POA: Insufficient documentation

## 2017-08-18 DIAGNOSIS — C187 Malignant neoplasm of sigmoid colon: Secondary | ICD-10-CM

## 2017-08-18 DIAGNOSIS — R970 Elevated carcinoembryonic antigen [CEA]: Secondary | ICD-10-CM | POA: Diagnosis not present

## 2017-08-18 DIAGNOSIS — Z923 Personal history of irradiation: Secondary | ICD-10-CM | POA: Diagnosis not present

## 2017-08-18 DIAGNOSIS — C19 Malignant neoplasm of rectosigmoid junction: Secondary | ICD-10-CM | POA: Diagnosis not present

## 2017-08-18 LAB — CBC WITH DIFFERENTIAL/PLATELET
BASOS ABS: 0.1 10*3/uL (ref 0–0.1)
BASOS PCT: 2 %
EOS ABS: 0.2 10*3/uL (ref 0–0.7)
EOS PCT: 4 %
HEMATOCRIT: 51 % (ref 40.0–52.0)
Hemoglobin: 16.8 g/dL (ref 13.0–18.0)
Lymphocytes Relative: 29 %
Lymphs Abs: 1.1 10*3/uL (ref 1.0–3.6)
MCH: 29.2 pg (ref 26.0–34.0)
MCHC: 33 g/dL (ref 32.0–36.0)
MCV: 88.6 fL (ref 80.0–100.0)
MONO ABS: 0.4 10*3/uL (ref 0.2–1.0)
MONOS PCT: 10 %
Neutro Abs: 2.2 10*3/uL (ref 1.4–6.5)
Neutrophils Relative %: 55 %
Platelets: 155 10*3/uL (ref 150–440)
RBC: 5.76 MIL/uL (ref 4.40–5.90)
RDW: 16.1 % — ABNORMAL HIGH (ref 11.5–14.5)
WBC: 3.9 10*3/uL (ref 3.8–10.6)

## 2017-08-18 MED ORDER — HEPARIN SOD (PORK) LOCK FLUSH 100 UNIT/ML IV SOLN
500.0000 [IU] | Freq: Once | INTRAVENOUS | Status: AC
  Administered 2017-08-18: 500 [IU] via INTRAVENOUS

## 2017-08-18 MED ORDER — SODIUM CHLORIDE 0.9% FLUSH
10.0000 mL | INTRAVENOUS | Status: DC | PRN
  Administered 2017-08-18: 10 mL via INTRAVENOUS
  Filled 2017-08-18: qty 10

## 2017-08-18 NOTE — Progress Notes (Signed)
Patient denies any concerns today.  

## 2017-08-19 LAB — CEA: CEA1: 7.9 ng/mL — AB (ref 0.0–4.7)

## 2017-10-13 ENCOUNTER — Inpatient Hospital Stay: Payer: Medicare Other | Attending: Oncology

## 2017-12-11 ENCOUNTER — Inpatient Hospital Stay: Payer: Medicare Other | Attending: Oncology

## 2017-12-11 DIAGNOSIS — Z923 Personal history of irradiation: Secondary | ICD-10-CM | POA: Insufficient documentation

## 2017-12-11 DIAGNOSIS — Z95828 Presence of other vascular implants and grafts: Secondary | ICD-10-CM

## 2017-12-11 DIAGNOSIS — Z9221 Personal history of antineoplastic chemotherapy: Secondary | ICD-10-CM | POA: Insufficient documentation

## 2017-12-11 DIAGNOSIS — R970 Elevated carcinoembryonic antigen [CEA]: Secondary | ICD-10-CM | POA: Diagnosis not present

## 2017-12-11 DIAGNOSIS — C19 Malignant neoplasm of rectosigmoid junction: Secondary | ICD-10-CM | POA: Diagnosis not present

## 2017-12-11 DIAGNOSIS — F17210 Nicotine dependence, cigarettes, uncomplicated: Secondary | ICD-10-CM | POA: Insufficient documentation

## 2017-12-11 MED ORDER — HEPARIN SOD (PORK) LOCK FLUSH 100 UNIT/ML IV SOLN
500.0000 [IU] | Freq: Once | INTRAVENOUS | Status: AC
  Administered 2017-12-11: 500 [IU] via INTRAVENOUS

## 2017-12-11 MED ORDER — SODIUM CHLORIDE 0.9% FLUSH
10.0000 mL | INTRAVENOUS | Status: DC | PRN
  Administered 2017-12-11: 10 mL via INTRAVENOUS
  Filled 2017-12-11: qty 10

## 2017-12-11 MED ORDER — HEPARIN SOD (PORK) LOCK FLUSH 100 UNIT/ML IV SOLN
INTRAVENOUS | Status: AC
  Filled 2017-12-11: qty 5

## 2018-02-16 ENCOUNTER — Inpatient Hospital Stay: Payer: Medicare Other | Attending: Oncology

## 2018-02-16 DIAGNOSIS — Z923 Personal history of irradiation: Secondary | ICD-10-CM | POA: Insufficient documentation

## 2018-02-16 DIAGNOSIS — C187 Malignant neoplasm of sigmoid colon: Secondary | ICD-10-CM

## 2018-02-16 DIAGNOSIS — Z9221 Personal history of antineoplastic chemotherapy: Secondary | ICD-10-CM | POA: Diagnosis not present

## 2018-02-16 DIAGNOSIS — Z85048 Personal history of other malignant neoplasm of rectum, rectosigmoid junction, and anus: Secondary | ICD-10-CM | POA: Insufficient documentation

## 2018-02-16 LAB — CBC WITH DIFFERENTIAL/PLATELET
ABS IMMATURE GRANULOCYTES: 0.01 10*3/uL (ref 0.00–0.07)
BASOS ABS: 0 10*3/uL (ref 0.0–0.1)
Basophils Relative: 0 %
Eosinophils Absolute: 0.1 10*3/uL (ref 0.0–0.5)
Eosinophils Relative: 3 %
HCT: 51.3 % (ref 39.0–52.0)
HEMOGLOBIN: 17 g/dL (ref 13.0–17.0)
IMMATURE GRANULOCYTES: 0 %
LYMPHS ABS: 1.3 10*3/uL (ref 0.7–4.0)
Lymphocytes Relative: 30 %
MCH: 30.1 pg (ref 26.0–34.0)
MCHC: 33.1 g/dL (ref 30.0–36.0)
MCV: 90.8 fL (ref 80.0–100.0)
Monocytes Absolute: 0.4 10*3/uL (ref 0.1–1.0)
Monocytes Relative: 9 %
NEUTROS ABS: 2.5 10*3/uL (ref 1.7–7.7)
NRBC: 0 % (ref 0.0–0.2)
Neutrophils Relative %: 58 %
Platelets: 165 10*3/uL (ref 150–400)
RBC: 5.65 MIL/uL (ref 4.22–5.81)
RDW: 14.5 % (ref 11.5–15.5)
WBC: 4.3 10*3/uL (ref 4.0–10.5)

## 2018-02-16 MED ORDER — SODIUM CHLORIDE 0.9% FLUSH
10.0000 mL | INTRAVENOUS | Status: AC | PRN
  Administered 2018-02-16: 10 mL via INTRAVENOUS
  Filled 2018-02-16: qty 10

## 2018-02-16 MED ORDER — HEPARIN SOD (PORK) LOCK FLUSH 100 UNIT/ML IV SOLN
500.0000 [IU] | Freq: Once | INTRAVENOUS | Status: AC
  Administered 2018-02-16: 500 [IU] via INTRAVENOUS

## 2018-02-16 NOTE — Addendum Note (Signed)
Addended by: Zara Chess on: 02/16/2018 09:49 AM   Modules accepted: Orders, SmartSet

## 2018-02-17 LAB — CEA: CEA1: 10.4 ng/mL — AB (ref 0.0–4.7)

## 2018-02-20 ENCOUNTER — Other Ambulatory Visit: Payer: Self-pay

## 2018-02-20 DIAGNOSIS — C187 Malignant neoplasm of sigmoid colon: Secondary | ICD-10-CM

## 2018-02-23 ENCOUNTER — Ambulatory Visit
Admission: RE | Admit: 2018-02-23 | Discharge: 2018-02-23 | Disposition: A | Discharge status: Home / Self Care | Payer: Medicare Other | Source: Ambulatory Visit | Attending: Oncology | Admitting: Oncology

## 2018-02-23 ENCOUNTER — Other Ambulatory Visit: Payer: Self-pay | Admitting: Oncology

## 2018-02-23 DIAGNOSIS — C187 Malignant neoplasm of sigmoid colon: Secondary | ICD-10-CM | POA: Insufficient documentation

## 2018-02-23 MED ORDER — IOPAMIDOL (ISOVUE-300) INJECTION 61%
100.0000 mL | Freq: Once | INTRAVENOUS | Status: AC | PRN
  Administered 2018-02-23: 100 mL via INTRAVENOUS

## 2018-02-25 NOTE — Progress Notes (Signed)
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  INTERVAL HISTORY: Patient returns to clinic today as an add-on to discuss his laboratory work and imaging results.  He continues to feel well and remains asymptomatic. He has no neurologic complaints. He denies any recent fevers or illnesses. He has a good appetite and denies weight loss. He has no chest pain or shortness of breath. He denies any nausea, vomiting, constipation, or diarrhea. He has noted no changes in his bowel movements and denies any melena or hematochezia. He has no urinary complaints.  Patient feels at his baseline offers no specific complaints today.  REVIEW OF SYSTEMS:   Review of Systems  Constitutional: Negative.  Negative for fever, malaise/fatigue and weight loss.  Respiratory: Negative.  Negative for cough and shortness of breath.   Cardiovascular: Negative.  Negative for chest pain and leg swelling.  Gastrointestinal: Negative.  Negative for abdominal pain, blood in stool, constipation, diarrhea, melena, nausea and vomiting.  Genitourinary: Negative.   Musculoskeletal: Negative.  Negative for back pain.  Skin: Negative.  Negative for rash.  Neurological: Negative.  Negative for focal weakness, weakness and headaches.  Psychiatric/Behavioral: Negative.  The patient is not nervous/anxious.     As per HPI. Otherwise, a complete review of systems is negative.  ONCOLOGY HISTORY:  No history exists.    PAST MEDICAL HISTORY: Past Medical History:  Diagnosis Date  . Arthritis    SHOULDERS AND JOINTS  . Cancer [facility])    Colon cancer 11/2010 with parital resection with chemo and rad tx  . Dental bridge present    REMOVABLE  .  Diabetes mellitus without complication (HCC)    TYPE 2, DIET CONTROLLED  . Hypertension   . Neuromuscular disorder ([place])    WEAKNESS AND NUMBNESS HANDS AND FEET, POSSIBLY FROM CHEMO    PAST SURGICAL HISTORY: Past Surgical History:  Procedure Laterality Date  . COLON SURGERY  NOV 2012   COLON REMOVED  . COLON SURGERY  APRIL 2014   OSTOMY REMOVED  . COLONOSCOPY N/A 04/21/2015   Procedure: COLONOSCOPY;  Surgeon: Hulen Luster, MD;  Location: [place];  Service: Gastroenterology;  Laterality: N/A;  DIABETIC, DIET CONTROLLED  . POLYPECTOMY N/A 04/21/2015   Procedure: POLYPECTOMY;  Surgeon: Hulen Luster, MD;  Location: [place];  Service: Gastroenterology;  Laterality: N/A;  . TUNNELED VENOUS PORT PLACEMENT      FAMILY HISTORY: Reviewed and unchanged. No reported history of malignancy or chronic disease.   ADVANCED DIRECTIVES:    HEALTH MAINTENANCE: Social History   Tobacco Use  . Smoking status: Current Every Day Smoker    Packs/day: 0.50    Years: 30.00    Pack years: 15.00    Types: Cigarettes  . Smokeless tobacco: Never Used  Substance Use Topics  . Alcohol use: No    Alcohol/week: 0.0 standard drinks  . Drug use: No    No Known Allergies  Current Outpatient Medications  Medication Sig Dispense Refill  . acetaminophen (TYLENOL) 650 MG CR tablet Take 650 mg by mouth every 8 (eight) hours as needed for pain.    Marland Kitchen amLODipine (NORVASC) 5 MG tablet Take 5 mg by mouth daily.    Marland Kitchen aspirin 81 MG tablet Take  81 mg by mouth daily. 800AM    . hydrochlorothiazide (HYDRODIURIL) 12.5 MG tablet Take 12.5 mg by mouth daily.    Marland Kitchen losartan (COZAAR) 100 MG tablet Take 100 mg by mouth daily.     No current facility-administered medications for this visit.    Facility-Administered Medications Ordered in Other Visits  Medication Dose Route Frequency Provider Last Rate Last Dose  . sodium chloride flush (NS) 0.9 % injection 10 mL  10 mL Intravenous PRN Lloyd Huger,  MD   10 mL at 02/16/18 0949    OBJECTIVE: BP (!) 167/89 (BP Location: Left Arm, Patient Position: Sitting)   Pulse 80   Temp (!) 97.1 F (36.2 C) (Tympanic)   Ht 6' (1.829 m)   Wt 202 lb 11.2 oz (91.9 kg)   BMI 27.49 kg/m    Body mass index is 27.49 kg/m.    ECOG FS:0 - Asymptomatic  General: Well-developed, well-nourished, no acute distress. Eyes: Pink conjunctiva, anicteric sclera. HEENT: Normocephalic, moist mucous membranes. Lungs: Clear to auscultation bilaterally. Heart: Regular rate and rhythm. No rubs, murmurs, or gallops. Abdomen: Soft, nontender, nondistended. No organomegaly noted, normoactive bowel sounds. Musculoskeletal: No edema, cyanosis, or clubbing. Neuro: Alert, answering all questions appropriately. Cranial nerves grossly intact. Skin: No rashes or petechiae noted. Psych: Normal affect.   LAB RESULTS:  No visits with results within 3 Day(s) from this visit.  Latest known visit with results is:  Infusion on 02/16/2018  Component Date Value Ref Range Status  . CEA 02/16/2018 10.4* 0.0 - 4.7 ng/mL Final   Comment: (NOTE)                             Nonsmokers          <3.9                             Smokers             <5.6 [employer] Electrochemiluminescence Immunoassay (ECLIA) Values obtained with different assay methods or kits cannot be used interchangeably.  Results cannot be interpreted as absolute evidence of the presence or absence of malignant disease. Performed At: [facility] [place], [place] [id number] Rush Farmer MD XN:[id number]   . WBC 02/16/2018 4.3  4.0 - 10.5 K/uL Final  . RBC 02/16/2018 5.65  4.22 - 5.81 MIL/uL Final  . Hemoglobin 02/16/2018 17.0  13.0 - 17.0 g/dL Final  . HCT 02/16/2018 51.3  39.0 - 52.0 % Final  . MCV 02/16/2018 90.8  80.0 - 100.0 fL Final  . MCH 02/16/2018 30.1  26.0 - 34.0 pg Final  . MCHC 02/16/2018 33.1  30.0 - 36.0 g/dL Final  . RDW 02/16/2018 14.5  11.5 - 15.5 % Final    . Platelets 02/16/2018 165  150 - 400 K/uL Final  . nRBC 02/16/2018 0.0  0.0 - 0.2 % Final  . Neutrophils Relative % 02/16/2018 58  % Final  . Neutro Abs 02/16/2018 2.5  1.7 - 7.7 K/uL Final  . Lymphocytes Relative 02/16/2018 30  % Final  . Lymphs Abs 02/16/2018 1.3  0.7 - 4.0 K/uL Final  . Monocytes Relative 02/16/2018 9  % Final  . Monocytes Absolute 02/16/2018 0.4  0.1 - 1.0 K/uL Final  . Eosinophils Relative 02/16/2018 3  % Final  . Eosinophils Absolute 02/16/2018 0.1  0.0 - 0.5 K/uL Final  .  Basophils Relative 02/16/2018 0  % Final  . Basophils Absolute 02/16/2018 0.0  0.0 - 0.1 K/uL Final  . Immature Granulocytes 02/16/2018 0  % Final  . Abs Immature Granulocytes 02/16/2018 0.01  0.00 - 0.07 K/uL Final   Performed at [facility], [address], [place], [place] [address]    STUDIES: No results found.  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   PLAN:    1. 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 He subsequently underwent partial colectomy and adjuvant chemotherapy which was completed on July 15, 2011. He then completed 5-FU and XRT in October 2013. His most recent colonoscopy on April 21, 2015 was reported as negative.  Patient CEA noted increased from 5.3-10.4 over the past 6 months.  CT scans from February 23, 2018 reviewed independently and report as above with no obvious evidence of recurrence.  Have made a referral back to GI for repeat colonoscopy.  Patient will return to clinic in 3 months for further evaluation at which time we will also repeat CEA.   I spent a total of 30 minutes face-to-face with the patient of which greater than 50% of the visit was spent in counseling and coordination of care as detailed above.   Patient expressed understanding and was in agreement with this plan. He also understands that He can call clinic at any time with any  questions, concerns, or complaints.    Lloyd Huger, MD   02/28/2018 1:31 PM

## 2018-02-27 ENCOUNTER — Other Ambulatory Visit: Payer: Self-pay

## 2018-02-27 ENCOUNTER — Inpatient Hospital Stay (HOSPITAL_BASED_OUTPATIENT_CLINIC_OR_DEPARTMENT_OTHER): Payer: Medicare Other | Admitting: Oncology

## 2018-02-27 VITALS — BP 167/89 | HR 80 | Temp 97.1°F | Ht 72.0 in | Wt 202.7 lb

## 2018-02-27 DIAGNOSIS — Z923 Personal history of irradiation: Secondary | ICD-10-CM | POA: Diagnosis not present

## 2018-02-27 DIAGNOSIS — Z85048 Personal history of other malignant neoplasm of rectum, rectosigmoid junction, and anus: Secondary | ICD-10-CM

## 2018-02-27 DIAGNOSIS — Z9221 Personal history of antineoplastic chemotherapy: Secondary | ICD-10-CM | POA: Diagnosis not present

## 2018-02-27 DIAGNOSIS — C187 Malignant neoplasm of sigmoid colon: Secondary | ICD-10-CM

## 2018-02-27 NOTE — Progress Notes (Signed)
Patient is here today to follow up on his malignant neoplasm of sigmoid colon. Patient stated that he had been doing well with no complaints.

## 2018-03-01 ENCOUNTER — Inpatient Hospital Stay: Payer: Medicare Other

## 2018-05-16 ENCOUNTER — Ambulatory Visit: Admit: 2018-05-16 | Payer: Medicare Other | Admitting: Internal Medicine

## 2018-05-16 SURGERY — COLONOSCOPY WITH PROPOFOL
Anesthesia: General

## 2018-05-30 ENCOUNTER — Other Ambulatory Visit: Payer: Medicare Other

## 2018-06-01 ENCOUNTER — Ambulatory Visit: Payer: Medicare Other | Admitting: Oncology

## 2018-08-12 NOTE — Progress Notes (Deleted)
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  INTERVAL HISTORY: Patient returns to clinic today as an add-on to discuss his laboratory work and imaging results.  He continues to feel well and remains asymptomatic. He has no neurologic complaints. He denies any recent fevers or illnesses. He has a good appetite and denies weight loss. He has no chest pain or shortness of breath. He denies any nausea, vomiting, constipation, or diarrhea. He has noted no changes in his bowel movements and denies any melena or hematochezia. He has no urinary complaints.  Patient feels at his baseline offers no specific complaints today.  REVIEW OF SYSTEMS:   Review of Systems  Constitutional: Negative.  Negative for fever, malaise/fatigue and weight loss.  Respiratory: Negative.  Negative for cough and shortness of breath.   Cardiovascular: Negative.  Negative for chest pain and leg swelling.  Gastrointestinal: Negative.  Negative for abdominal pain, blood in stool, constipation, diarrhea, melena, nausea and vomiting.  Genitourinary: Negative.   Musculoskeletal: Negative.  Negative for back pain.  Skin: Negative.  Negative for rash.  Neurological: Negative.  Negative for focal weakness, weakness and headaches.  Psychiatric/Behavioral: Negative.  The patient is not nervous/anxious.     As per HPI. Otherwise, a complete review of systems is negative.  ONCOLOGY HISTORY: Oncology History   No history exists.    PAST MEDICAL HISTORY: Past Medical History:  Diagnosis Date   Arthritis    SHOULDERS AND JOINTS   Cancer [facility])    Colon cancer 11/2010 with parital resection with chemo and rad tx   Dental bridge present     REMOVABLE   Diabetes mellitus without complication ([place])    TYPE 2, DIET CONTROLLED   Hypertension    Neuromuscular disorder ([place])    WEAKNESS AND NUMBNESS HANDS AND FEET, POSSIBLY FROM CHEMO    PAST SURGICAL HISTORY: Past Surgical History:  Procedure Laterality Date   COLON SURGERY  NOV 2012   COLON REMOVED   COLON SURGERY  APRIL 2014   OSTOMY REMOVED   COLONOSCOPY N/A 04/21/2015   Procedure: COLONOSCOPY;  Surgeon: Hulen Luster, MD;  Location: [place];  Service: Gastroenterology;  Laterality: N/A;  DIABETIC, DIET CONTROLLED   POLYPECTOMY N/A 04/21/2015   Procedure: POLYPECTOMY;  Surgeon: Hulen Luster, MD;  Location: [place];  Service: Gastroenterology;  Laterality: N/A;   TUNNELED VENOUS PORT PLACEMENT      FAMILY HISTORY: Reviewed and unchanged. No reported history of malignancy or chronic disease.   ADVANCED DIRECTIVES:    HEALTH MAINTENANCE: Social History   Tobacco Use   Smoking status: Current Every Day Smoker    Packs/day: 0.50    Years: 30.00    Pack years: 15.00    Types: Cigarettes   Smokeless tobacco: Never Used  Substance Use Topics   Alcohol use: No    Alcohol/week: 0.0 standard drinks   Drug use: No    No Known Allergies  Current Outpatient Medications  Medication Sig Dispense Refill   acetaminophen (TYLENOL) 650 MG CR tablet Take 650 mg by mouth every 8 (eight) hours as needed for pain.     amLODipine (NORVASC) 5 MG tablet Take 5 mg by mouth daily.     aspirin 81  MG tablet Take 81 mg by mouth daily. 800AM     hydrochlorothiazide (HYDRODIURIL) 12.5 MG tablet Take 12.5 mg by mouth daily.     losartan (COZAAR) 100 MG tablet Take 100 mg by mouth daily.     No current facility-administered medications for this visit.    Facility-Administered Medications Ordered in Other Visits  Medication Dose Route Frequency Provider Last Rate Last Dose   sodium chloride flush (NS) 0.9 % injection 10 mL  10 mL Intravenous PRN  Lloyd Huger, MD   10 mL at 02/16/18 7829    OBJECTIVE: There were no vitals taken for this visit.   There is no height or weight on file to calculate BMI.    ECOG FS:0 - Asymptomatic  General: Well-developed, well-nourished, no acute distress. Eyes: Pink conjunctiva, anicteric sclera. HEENT: Normocephalic, moist mucous membranes. Lungs: Clear to auscultation bilaterally. Heart: Regular rate and rhythm. No rubs, murmurs, or gallops. Abdomen: Soft, nontender, nondistended. No organomegaly noted, normoactive bowel sounds. Musculoskeletal: No edema, cyanosis, or clubbing. Neuro: Alert, answering all questions appropriately. Cranial nerves grossly intact. Skin: No rashes or petechiae noted. Psych: Normal affect.   LAB RESULTS:  No visits with results within 3 Day(s) from this visit.  Latest known visit with results is:  Infusion on 02/16/2018  Component Date Value Ref Range Status   CEA 02/16/2018 10.4* 0.0 - 4.7 ng/mL Final   Comment: (NOTE)                             Nonsmokers          <3.9                             Smokers             <5.6 [employer] Electrochemiluminescence Immunoassay (ECLIA) Values obtained with different assay methods or kits cannot be used interchangeably.  Results cannot be interpreted as absolute evidence of the presence or absence of malignant disease. Performed At: [facility] [place], [place] [id number] Rush Farmer MD HQ:[id number]    WBC 02/16/2018 4.3  4.0 - 10.5 K/uL Final   RBC 02/16/2018 5.65  4.22 - 5.81 MIL/uL Final   Hemoglobin 02/16/2018 17.0  13.0 - 17.0 g/dL Final   HCT 02/16/2018 51.3  39.0 - 52.0 % Final   MCV 02/16/2018 90.8  80.0 - 100.0 fL Final   MCH 02/16/2018 30.1  26.0 - 34.0 pg Final   MCHC 02/16/2018 33.1  30.0 - 36.0 g/dL Final   RDW 02/16/2018 14.5  11.5 - 15.5 % Final   Platelets 02/16/2018 165  150 - 400 K/uL Final   nRBC 02/16/2018 0.0  0.0 - 0.2 % Final    Neutrophils Relative % 02/16/2018 58  % Final   Neutro Abs 02/16/2018 2.5  1.7 - 7.7 K/uL Final   Lymphocytes Relative 02/16/2018 30  % Final   Lymphs Abs 02/16/2018 1.3  0.7 - 4.0 K/uL Final   Monocytes Relative 02/16/2018 9  % Final   Monocytes Absolute 02/16/2018 0.4  0.1 - 1.0 K/uL Final   Eosinophils Relative 02/16/2018 3  % Final   Eosinophils Absolute 02/16/2018 0.1  0.0 - 0.5 K/uL Final   Basophils Relative 02/16/2018 0  % Final   Basophils Absolute 02/16/2018 0.0  0.0 - 0.1 K/uL Final   Immature Granulocytes 02/16/2018 0  % Final  Abs Immature Granulocytes 02/16/2018 0.01  0.00 - 0.07 K/uL Final   Performed at [facility], [address], [place], [place] [address]    STUDIES: No results found.  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   PLAN:    1. 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 He subsequently underwent partial colectomy and adjuvant chemotherapy which was completed on July 15, 2011. He then completed 5-FU and XRT in October 2013. His most recent colonoscopy on April 21, 2015 was reported as negative.  Patient CEA noted increased from 5.3-10.4 over the past 6 months.  CT scans from February 23, 2018 reviewed independently and report as above with no obvious evidence of recurrence.  Have made a referral back to GI for repeat colonoscopy.  Patient will return to clinic in 3 months for further evaluation at which time we will also repeat CEA.   I spent a total of 30 minutes face-to-face with the patient of which greater than 50% of the visit was spent in counseling and coordination of care as detailed above.   Patient expressed understanding and was in agreement with this plan. He also understands that He can call clinic at any time with any questions, concerns, or complaints.    Lloyd Huger, MD   08/12/2018 10:50 AM

## 2018-08-15 ENCOUNTER — Other Ambulatory Visit: Payer: Medicare Other

## 2018-08-17 ENCOUNTER — Ambulatory Visit: Payer: Medicare Other | Admitting: Oncology

## 2018-09-13 ENCOUNTER — Other Ambulatory Visit: Payer: Self-pay

## 2018-09-14 ENCOUNTER — Inpatient Hospital Stay: Payer: Medicare Other | Attending: Hematology and Oncology

## 2018-09-14 DIAGNOSIS — Z9221 Personal history of antineoplastic chemotherapy: Secondary | ICD-10-CM | POA: Diagnosis not present

## 2018-09-14 DIAGNOSIS — Z923 Personal history of irradiation: Secondary | ICD-10-CM | POA: Diagnosis not present

## 2018-09-14 DIAGNOSIS — I2510 Atherosclerotic heart disease of native coronary artery without angina pectoris: Secondary | ICD-10-CM | POA: Diagnosis not present

## 2018-09-14 DIAGNOSIS — I10 Essential (primary) hypertension: Secondary | ICD-10-CM | POA: Insufficient documentation

## 2018-09-14 DIAGNOSIS — Z9049 Acquired absence of other specified parts of digestive tract: Secondary | ICD-10-CM | POA: Diagnosis not present

## 2018-09-14 DIAGNOSIS — C187 Malignant neoplasm of sigmoid colon: Secondary | ICD-10-CM

## 2018-09-14 DIAGNOSIS — F17210 Nicotine dependence, cigarettes, uncomplicated: Secondary | ICD-10-CM | POA: Insufficient documentation

## 2018-09-14 DIAGNOSIS — M549 Dorsalgia, unspecified: Secondary | ICD-10-CM | POA: Insufficient documentation

## 2018-09-14 DIAGNOSIS — Z85048 Personal history of other malignant neoplasm of rectum, rectosigmoid junction, and anus: Secondary | ICD-10-CM | POA: Insufficient documentation

## 2018-09-14 DIAGNOSIS — Z7982 Long term (current) use of aspirin: Secondary | ICD-10-CM | POA: Diagnosis not present

## 2018-09-14 DIAGNOSIS — Z79899 Other long term (current) drug therapy: Secondary | ICD-10-CM | POA: Diagnosis not present

## 2018-09-14 DIAGNOSIS — E119 Type 2 diabetes mellitus without complications: Secondary | ICD-10-CM | POA: Diagnosis not present

## 2018-09-14 DIAGNOSIS — G8929 Other chronic pain: Secondary | ICD-10-CM | POA: Insufficient documentation

## 2018-09-14 LAB — CBC WITH DIFFERENTIAL/PLATELET
Abs Immature Granulocytes: 0 10*3/uL (ref 0.00–0.07)
Basophils Absolute: 0 10*3/uL (ref 0.0–0.1)
Basophils Relative: 0 %
Eosinophils Absolute: 0.2 10*3/uL (ref 0.0–0.5)
Eosinophils Relative: 4 %
HCT: 51.1 % (ref 39.0–52.0)
Hemoglobin: 17 g/dL (ref 13.0–17.0)
Immature Granulocytes: 0 %
Lymphocytes Relative: 37 %
Lymphs Abs: 1.4 10*3/uL (ref 0.7–4.0)
MCH: 29.7 pg (ref 26.0–34.0)
MCHC: 33.3 g/dL (ref 30.0–36.0)
MCV: 89.2 fL (ref 80.0–100.0)
Monocytes Absolute: 0.4 10*3/uL (ref 0.1–1.0)
Monocytes Relative: 11 %
Neutro Abs: 1.7 10*3/uL (ref 1.7–7.7)
Neutrophils Relative %: 48 %
Platelets: 154 10*3/uL (ref 150–400)
RBC: 5.73 MIL/uL (ref 4.22–5.81)
RDW: 14.5 % (ref 11.5–15.5)
WBC: 3.7 10*3/uL — ABNORMAL LOW (ref 4.0–10.5)
nRBC: 0 % (ref 0.0–0.2)

## 2018-09-14 MED ORDER — SODIUM CHLORIDE 0.9% FLUSH
10.0000 mL | INTRAVENOUS | Status: DC | PRN
  Administered 2018-09-14: 10:00:00 10 mL via INTRAVENOUS
  Filled 2018-09-14: qty 10

## 2018-09-14 MED ORDER — HEPARIN SOD (PORK) LOCK FLUSH 100 UNIT/ML IV SOLN
500.0000 [IU] | Freq: Once | INTRAVENOUS | Status: AC
  Administered 2018-09-14: 500 [IU] via INTRAVENOUS

## 2018-09-14 NOTE — Progress Notes (Signed)
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  INTERVAL HISTORY: Patient returns to clinic today for discussion of his laboratory work and further evaluation.  Despite recommending a colonoscopy in February 2020, patient has yet to complete this procedure.  His only complaint today is of chronic back pain that is unchanged.  He otherwise feels well and is asymptomatic. He has no neurologic complaints. He denies any recent fevers or illnesses. He has a good appetite and denies weight loss.  He denies any chest pain, shortness of breath, cough, or hemoptysis.  He denies any nausea, vomiting, constipation, or diarrhea. He has noted no changes in his bowel movements and denies any melena or hematochezia. He has no urinary complaints.  Patient offers no further specific complaints today.  REVIEW OF SYSTEMS:   Review of Systems  Constitutional: Negative.  Negative for fever, malaise/fatigue and weight loss.  Respiratory: Negative.  Negative for cough, hemoptysis and shortness of breath.   Cardiovascular: Negative.  Negative for chest pain and leg swelling.  Gastrointestinal: Negative.  Negative for abdominal pain, blood in stool, constipation, diarrhea, melena, nausea and vomiting.  Genitourinary: Negative.  Negative for dysuria.  Musculoskeletal: Positive for back pain.  Skin: Negative.  Negative for rash.  Neurological: Negative.  Negative for focal weakness, weakness and headaches.  Psychiatric/Behavioral: Negative.  The patient is not nervous/anxious.     As per HPI. Otherwise, a complete review of systems is negative.  ONCOLOGY HISTORY: Oncology History   No history exists.    PAST MEDICAL HISTORY:  Past Medical History:  Diagnosis Date  . Arthritis    SHOULDERS AND JOINTS  . Cancer [facility])    Colon cancer 11/2010 with parital resection with chemo and rad tx  . Dental bridge present    REMOVABLE  . Diabetes mellitus without complication ([facility])    TYPE 2, DIET CONTROLLED  . Hypertension   . Neuromuscular disorder ([place])    WEAKNESS AND NUMBNESS HANDS AND FEET, POSSIBLY FROM CHEMO    PAST SURGICAL HISTORY: Past Surgical History:  Procedure Laterality Date  . COLON SURGERY  NOV 2012   COLON REMOVED  . COLON SURGERY  APRIL 2014   OSTOMY REMOVED  . COLONOSCOPY N/A 04/21/2015   Procedure: COLONOSCOPY;  Surgeon: Hulen Luster, MD;  Location: [place];  Service: Gastroenterology;  Laterality: N/A;  DIABETIC, DIET CONTROLLED  . POLYPECTOMY N/A 04/21/2015   Procedure: POLYPECTOMY;  Surgeon: Hulen Luster, MD;  Location: [place];  Service: Gastroenterology;  Laterality: N/A;  . TUNNELED VENOUS PORT PLACEMENT      FAMILY HISTORY: Reviewed and unchanged. No reported history of malignancy or chronic disease.   ADVANCED DIRECTIVES:    HEALTH MAINTENANCE: Social History   Tobacco Use  . Smoking status: Current Every Day Smoker    Packs/day: 0.50    Years: 30.00    Pack years: 15.00    Types: Cigarettes  . Smokeless tobacco: Never Used  Substance Use Topics  . Alcohol use: No    Alcohol/week: 0.0 standard drinks  . Drug use: No    No Known Allergies  Current Outpatient Medications  Medication Sig Dispense Refill  . acetaminophen (TYLENOL) 650 MG CR tablet Take 650 mg by mouth every  8 (eight) hours as needed for pain.    Marland Kitchen amLODipine (NORVASC) 5 MG tablet Take 5 mg by mouth daily.    Marland Kitchen aspirin 81 MG tablet Take 81 mg by mouth daily. 800AM    . hydrochlorothiazide (HYDRODIURIL) 12.5 MG tablet Take 12.5 mg by mouth daily.    Marland Kitchen losartan (COZAAR) 100 MG tablet Take 100 mg by mouth daily.     No current facility-administered medications for this visit.     Facility-Administered Medications Ordered in Other Visits  Medication Dose Route Frequency Provider Last Rate Last Dose  . sodium chloride flush (NS) 0.9 % injection 10 mL  10 mL Intravenous PRN Lloyd Huger, MD   10 mL at 02/16/18 0949    OBJECTIVE: BP (!) 149/78 (BP Location: Right Arm, Patient Position: Sitting)   Pulse 60   Temp 98.3 F (36.8 C) (Tympanic)   Wt 199 lb (90.3 kg)   BMI 26.99 kg/m    Body mass index is 26.99 kg/m.    ECOG FS:0 - Asymptomatic  General: Well-developed, well-nourished, no acute distress. Eyes: Pink conjunctiva, anicteric sclera. HEENT: Normocephalic, moist mucous membranes. Lungs: Clear to auscultation bilaterally. Heart: Regular rate and rhythm. No rubs, murmurs, or gallops. Abdomen: Soft, nontender, nondistended. No organomegaly noted, normoactive bowel sounds. Musculoskeletal: No edema, cyanosis, or clubbing. Neuro: Alert, answering all questions appropriately. Cranial nerves grossly intact. Skin: No rashes or petechiae noted. Psych: Normal affect.  LAB RESULTS:  No visits with results within 3 Day(s) from this visit.  Latest known visit with results is:  Infusion on 09/14/2018  Component Date Value Ref Range Status  . CEA 09/14/2018 11.3* 0.0 - 4.7 ng/mL Final   Comment: (NOTE)                             Nonsmokers          <3.9                             Smokers             <5.6 [employer] Electrochemiluminescence Immunoassay (ECLIA) Values obtained with different assay methods or kits cannot be used interchangeably.  Results cannot be interpreted as absolute evidence of the presence or absence of malignant disease. Performed At: [facility] [place], [place] [id number] Rush Farmer MD [id number]   . WBC 09/14/2018 3.7* 4.0 - 10.5 K/uL Final  . RBC 09/14/2018 5.73  4.22 - 5.81 MIL/uL Final  . Hemoglobin 09/14/2018 17.0  13.0 - 17.0 g/dL Final  . HCT 09/14/2018 51.1  39.0 - 52.0 % Final   . MCV 09/14/2018 89.2  80.0 - 100.0 fL Final  . MCH 09/14/2018 29.7  26.0 - 34.0 pg Final  . MCHC 09/14/2018 33.3  30.0 - 36.0 g/dL Final  . RDW 09/14/2018 14.5  11.5 - 15.5 % Final  . Platelets 09/14/2018 154  150 - 400 K/uL Final  . nRBC 09/14/2018 0.0  0.0 - 0.2 % Final  . Neutrophils Relative % 09/14/2018 48  % Final  . Neutro Abs 09/14/2018 1.7  1.7 - 7.7 K/uL Final  . Lymphocytes Relative 09/14/2018 37  % Final  . Lymphs Abs 09/14/2018 1.4  0.7 - 4.0 K/uL Final  . Monocytes Relative 09/14/2018 11  % Final  . Monocytes Absolute 09/14/2018 0.4  0.1 - 1.0 K/uL Final  . Eosinophils Relative  09/14/2018 4  % Final  . Eosinophils Absolute 09/14/2018 0.2  0.0 - 0.5 K/uL Final  . Basophils Relative 09/14/2018 0  % Final  . Basophils Absolute 09/14/2018 0.0  0.0 - 0.1 K/uL Final  . Immature Granulocytes 09/14/2018 0  % Final  . Abs Immature Granulocytes 09/14/2018 0.00  0.00 - 0.07 K/uL Final   Performed at [facility], [address] [place], [place] [address]    STUDIES: No results found.  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   PLAN:    1. 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 He subsequently underwent partial colectomy and adjuvant chemotherapy which was completed on July 15, 2011. He then completed 5-FU and XRT in October 2013. His most recent colonoscopy on April 21, 2015 was reported as negative.  Patient CEA continues to trend upward and is now 11.3.  CT scans from February 23, 2018 reviewed independently with no obvious evidence of recurrence.  Despite recommendations, patient is hesitant to undergo repeat colonoscopy because of the COVID pandemic.  Have sent a referral to GI to further discuss the issue.  Will repeat CT scans, including chest, in the next 1 to 2 weeks and then patient will follow-up with telemedicine visit to discuss the results. 2.  Back  pain: Chronic and unchanged.    I spent a total of 30 minutes face-to-face with the patient of which greater than 50% of the visit was spent in counseling and coordination of care as detailed above.  Patient expressed understanding and was in agreement with this plan. He also understands that He can call clinic at any time with any questions, concerns, or complaints.    Lloyd Huger, MD   09/18/2018 2:26 PM

## 2018-09-15 LAB — CEA: CEA: 11.3 ng/mL — ABNORMAL HIGH (ref 0.0–4.7)

## 2018-09-18 ENCOUNTER — Encounter: Payer: Self-pay | Admitting: Oncology

## 2018-09-18 ENCOUNTER — Inpatient Hospital Stay (HOSPITAL_BASED_OUTPATIENT_CLINIC_OR_DEPARTMENT_OTHER): Payer: Medicare Other | Admitting: Oncology

## 2018-09-18 ENCOUNTER — Other Ambulatory Visit: Payer: Self-pay

## 2018-09-18 VITALS — BP 149/78 | HR 60 | Temp 98.3°F | Wt 199.0 lb

## 2018-09-18 DIAGNOSIS — C187 Malignant neoplasm of sigmoid colon: Secondary | ICD-10-CM

## 2018-09-18 DIAGNOSIS — Z85048 Personal history of other malignant neoplasm of rectum, rectosigmoid junction, and anus: Secondary | ICD-10-CM | POA: Diagnosis not present

## 2018-09-18 NOTE — Progress Notes (Signed)
Patient here today for follow up.  Patient states no new concerns today  

## 2018-09-19 ENCOUNTER — Telehealth: Payer: Self-pay

## 2018-09-19 NOTE — Telephone Encounter (Signed)
Message sent to Dr. Alice Reichert and his team to contact patient for appointment to assist with arranging colonoscopy.

## 2018-09-20 ENCOUNTER — Telehealth: Payer: Self-pay

## 2018-09-20 NOTE — Telephone Encounter (Signed)
Cameron Howe was contacted by [name] and offered an appointment tomorrow or Monday. Per note below, he declined and scheduled 10/08/18:   "I called patient's home number and left a message requesting a call back. I then called his cell phone and spoke with the patient.   I alerted the patient that we received a notification from his other care provide that he needed another appointment with our office and that I would be happy to go ahead and set one up for him. The patient remarked that Dr. Grayland Ormond did let him know that he was due for a colonoscopy but with the virus it had been put off. I let [name] know that we are interested in his follow-up care and he said that it's already been a few months "So what's the rush." He declined to schedule tomorrow or Monday at [facility]. He requested to be scheduled in [place] where he went previously.   This was accommodated and he was scheduled with [name] 10/08/18.  Patient was encouraged to call in if he had any additional needs or questions. Patient notified me that he would not be calling in prior to his appointment."

## 2018-09-25 ENCOUNTER — Ambulatory Visit
Admission: RE | Admit: 2018-09-25 | Discharge: 2018-09-25 | Disposition: A | Discharge status: Home / Self Care | Payer: Medicare Other | Source: Ambulatory Visit | Attending: Oncology | Admitting: Oncology

## 2018-09-25 ENCOUNTER — Other Ambulatory Visit: Payer: Self-pay

## 2018-09-25 DIAGNOSIS — C187 Malignant neoplasm of sigmoid colon: Secondary | ICD-10-CM | POA: Insufficient documentation

## 2018-09-25 MED ORDER — IOHEXOL 300 MG/ML  SOLN
100.0000 mL | Freq: Once | INTRAMUSCULAR | Status: AC | PRN
  Administered 2018-09-25: 100 mL via INTRAVENOUS

## 2018-09-27 ENCOUNTER — Encounter: Payer: Self-pay | Admitting: Oncology

## 2018-09-27 ENCOUNTER — Other Ambulatory Visit: Payer: Self-pay

## 2018-09-27 NOTE — Progress Notes (Signed)
Patient pre screened for office appointment, no questions or concerns today. 

## 2018-09-28 ENCOUNTER — Inpatient Hospital Stay (HOSPITAL_BASED_OUTPATIENT_CLINIC_OR_DEPARTMENT_OTHER): Payer: Medicare Other | Admitting: Oncology

## 2018-09-28 DIAGNOSIS — C187 Malignant neoplasm of sigmoid colon: Secondary | ICD-10-CM | POA: Diagnosis not present

## 2018-09-28 NOTE — Progress Notes (Signed)
[place]  Telephone:([phone]) [phone]  Fax:([phone]) [place] DOB: 1945/03/22  MR#: [id number]  [id number]  Patient Care Team: Tracie Harrier, MD as PCP - General (Internal Medicine) Manya Silvas, MD (Gastroenterology) Clent Jacks, RN as Oncology Nurse Navigator  I connected with Cameron Howe on 09/29/18 at  2:30 PM EDT by telephone visit and verified that I am speaking with the correct person using two identifiers.   I discussed the limitations, risks, security and privacy concerns of performing an evaluation and management service by telemedicine and the availability of in-person appointments. I also discussed with the patient that there may be a patient responsible charge related to this service. The patient expressed understanding and agreed to proceed.   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  INTERVAL HISTORY: Patient agreed for further evaluation and discussion of his imaging results by telephone.  He continues to feel well and remains asymptomatic. He has no neurologic complaints. He denies any recent fevers or illnesses. He has a good appetite and denies weight loss.  He denies any chest pain, shortness of breath, cough, or hemoptysis.  He denies any nausea, vomiting, constipation, or diarrhea. He has noted no changes in his bowel movements and denies any melena or hematochezia. He has no urinary complaints.  Patient feels at his baseline offers no specific complaints today.  REVIEW OF SYSTEMS:   Review of Systems  Constitutional: Negative.  Negative for fever, malaise/fatigue and weight loss.  Respiratory: Negative.  Negative for cough, hemoptysis and shortness of breath.   Cardiovascular: Negative.  Negative for chest pain and leg swelling.   Gastrointestinal: Negative.  Negative for abdominal pain, blood in stool, constipation, diarrhea, melena, nausea and vomiting.  Genitourinary: Negative.  Negative for dysuria.  Musculoskeletal: Positive for back pain.  Skin: Negative.  Negative for rash.  Neurological: Negative.  Negative for focal weakness, weakness and headaches.  Psychiatric/Behavioral: Negative.  The patient is not nervous/anxious.     As per HPI. Otherwise, a complete review of systems is negative.  ONCOLOGY HISTORY: Oncology History   No history exists.    PAST MEDICAL HISTORY: Past Medical History:  Diagnosis Date   Arthritis    SHOULDERS AND JOINTS   Cancer [facility])    Colon cancer 11/2010 with parital resection with chemo and rad tx   Dental bridge present    REMOVABLE   Diabetes mellitus without complication ([place])    TYPE 2, DIET CONTROLLED   Hypertension    Neuromuscular disorder ([place])    WEAKNESS AND NUMBNESS HANDS AND FEET, POSSIBLY FROM CHEMO    PAST SURGICAL HISTORY: Past Surgical History:  Procedure Laterality Date   COLON SURGERY  NOV 2012   COLON REMOVED   COLON SURGERY  APRIL 2014   OSTOMY REMOVED   COLONOSCOPY N/A 04/21/2015   Procedure: COLONOSCOPY;  Surgeon: Hulen Luster, MD;  Location: [place];  Service: Gastroenterology;  Laterality: N/A;  DIABETIC, DIET CONTROLLED   POLYPECTOMY N/A 04/21/2015   Procedure: POLYPECTOMY;  Surgeon: Hulen Luster, MD;  Location: [place];  Service: Gastroenterology;  Laterality: N/A;   TUNNELED VENOUS PORT PLACEMENT      FAMILY HISTORY: Reviewed and unchanged. No reported history of malignancy or chronic disease.   ADVANCED DIRECTIVES:    HEALTH MAINTENANCE: Social History  Tobacco Use   Smoking status: Current Every Day Smoker    Packs/day: 0.50    Years: 30.00    Pack years: 15.00    Types: Cigarettes   Smokeless tobacco: Never Used  Substance Use Topics   Alcohol use: No    Alcohol/week: 0.0 standard  drinks   Drug use: No    No Known Allergies  Current Outpatient Medications  Medication Sig Dispense Refill   acetaminophen (TYLENOL) 650 MG CR tablet Take 650 mg by mouth every 8 (eight) hours as needed for pain.     amLODipine (NORVASC) 5 MG tablet Take 5 mg by mouth daily.     aspirin 81 MG tablet Take 81 mg by mouth daily. 800AM     hydrochlorothiazide (HYDRODIURIL) 12.5 MG tablet Take 12.5 mg by mouth daily.     losartan (COZAAR) 100 MG tablet Take 100 mg by mouth daily.     No current facility-administered medications for this visit.    Facility-Administered Medications Ordered in Other Visits  Medication Dose Route Frequency Provider Last Rate Last Dose   sodium chloride flush (NS) 0.9 % injection 10 mL  10 mL Intravenous PRN Lloyd Huger, MD   10 mL at 02/16/18 [id number]    OBJECTIVE: There were no vitals taken for this visit.   There is no height or weight on file to calculate BMI.    ECOG FS:0 - Asymptomatic  LAB RESULTS:  No visits with results within 3 Day(s) from this visit.  Latest known visit with results is:  Infusion on 09/14/2018  Component Date Value Ref Range Status   CEA 09/14/2018 11.3* 0.0 - 4.7 ng/mL Final   Comment: (NOTE)                             Nonsmokers          <3.9                             Smokers             <5.6 [employer] Electrochemiluminescence Immunoassay (ECLIA) Values obtained with different assay methods or kits cannot be used interchangeably.  Results cannot be interpreted as absolute evidence of the presence or absence of malignant disease. Performed At: [facility] [place], [place] [id number] Rush Farmer MD [id number]    WBC 09/14/2018 3.7* 4.0 - 10.5 K/uL Final   RBC 09/14/2018 5.73  4.22 - 5.81 MIL/uL Final   Hemoglobin 09/14/2018 17.0  13.0 - 17.0 g/dL Final   HCT 09/14/2018 51.1  39.0 - 52.0 % Final   MCV 09/14/2018 89.2  80.0 - 100.0 fL Final   MCH 09/14/2018  29.7  26.0 - 34.0 pg Final   MCHC 09/14/2018 33.3  30.0 - 36.0 g/dL Final   RDW 09/14/2018 14.5  11.5 - 15.5 % Final   Platelets 09/14/2018 154  150 - 400 K/uL Final   nRBC 09/14/2018 0.0  0.0 - 0.2 % Final   Neutrophils Relative % 09/14/2018 48  % Final   Neutro Abs 09/14/2018 1.7  1.7 - 7.7 K/uL Final   Lymphocytes Relative 09/14/2018 37  % Final   Lymphs Abs 09/14/2018 1.4  0.7 - 4.0 K/uL Final   Monocytes Relative 09/14/2018 11  % Final   Monocytes Absolute 09/14/2018 0.4  0.1 - 1.0 K/uL Final   Eosinophils Relative 09/14/2018 4  %  Final   Eosinophils Absolute 09/14/2018 0.2  0.0 - 0.5 K/uL Final   Basophils Relative 09/14/2018 0  % Final   Basophils Absolute 09/14/2018 0.0  0.0 - 0.1 K/uL Final   Immature Granulocytes 09/14/2018 0  % Final   Abs Immature Granulocytes 09/14/2018 0.00  0.00 - 0.07 K/uL Final   Performed at [facility], [address], [place], [place] [address]    STUDIES: Ct Chest W Contrast  Result Date: 09/25/2018 CLINICAL DATA:  Restaging rectosigmoid colon cancer, increasing CEA, resection with chemo and radiation therapy 2012 EXAM: CT CHEST, ABDOMEN, AND PELVIS WITH CONTRAST TECHNIQUE: Multidetector CT imaging of the chest, abdomen and pelvis was performed following the standard protocol during bolus administration of intravenous contrast. CONTRAST:  1106mL OMNIPAQUE IOHEXOL 300 MG/ML SOLN, additional oral enteric contrast COMPARISON:  02/23/2018, 01/27/2015, 04/23/2014 FINDINGS: CT CHEST FINDINGS Cardiovascular: Aortic atherosclerosis. Normal heart size. Three-vessel coronary artery calcifications. No pericardial effusion. Right chest port catheter. Mediastinum/Nodes: No enlarged mediastinal, hilar, or axillary lymph nodes. Thyroid gland, trachea, and esophagus demonstrate no significant findings. Lungs/Pleura: Severe centrilobular and paraseptal emphysema with redemonstrated bandlike scarring and volume loss of the right lung base along  with severe elevation of the right hemidiaphragm. Stable, benign 8 mm pulmonary nodule of the left lower lobe (series 4, image 63). No pleural effusion or pneumothorax. Musculoskeletal: No chest wall mass or suspicious bone lesions identified. Severe right glenohumeral arthrosis with large pannus or calcified loose bodies within the subscapular joint recess. CT ABDOMEN PELVIS FINDINGS Hepatobiliary: No solid liver abnormality is seen. No gallstones, gallbladder wall thickening, or biliary dilatation. Pancreas: Unremarkable. No pancreatic ductal dilatation or surrounding inflammatory changes. Spleen: Normal in size without significant abnormality. Adrenals/Urinary Tract: Adrenal glands are unremarkable. Multiple bilateral exophytic renal cysts. Kidneys are otherwise normal, without renal calculi, solid lesion, or hydronephrosis. Bladder is unremarkable. Stomach/Bowel: Stomach is within normal limits. Appendix is surgically absent status post sigmorectal resection and reanastomosis. Vascular/Lymphatic: Aortic atherosclerosis. Unchanged 1.5 cm focal ectasia of the distal right common iliac artery. No enlarged abdominal or pelvic lymph nodes. Reproductive: No mass or other abnormality. Other: There is a large right inguinal hernia containing multiple loops of nonobstructed distal small bowel. No abdominopelvic ascites. Musculoskeletal: No acute or significant osseous findings. IMPRESSION: 1. Postoperative findings of sigmorectal resection without evidence of recurrent mass, lymphadenopathy, or distant metastatic disease. 2.  Stable, benign 8 mm pulmonary nodule of the left lower lobe. 3. Large right inguinal hernia containing multiple loops of nonobstructed distal small bowel. 4. Coronary artery disease. Aortic Atherosclerosis (ICD10-I70.0) and Emphysema (ICD10-J43.9). Electronically Signed   By: Eddie Candle M.D.   On: 09/25/2018 11:07   Ct Abdomen Pelvis W Contrast  Result Date: 09/25/2018 CLINICAL DATA:   Restaging rectosigmoid colon cancer, increasing CEA, resection with chemo and radiation therapy 2012 EXAM: CT CHEST, ABDOMEN, AND PELVIS WITH CONTRAST TECHNIQUE: Multidetector CT imaging of the chest, abdomen and pelvis was performed following the standard protocol during bolus administration of intravenous contrast. CONTRAST:  125mL OMNIPAQUE IOHEXOL 300 MG/ML SOLN, additional oral enteric contrast COMPARISON:  02/23/2018, 01/27/2015, 04/23/2014 FINDINGS: CT CHEST FINDINGS Cardiovascular: Aortic atherosclerosis. Normal heart size. Three-vessel coronary artery calcifications. No pericardial effusion. Right chest port catheter. Mediastinum/Nodes: No enlarged mediastinal, hilar, or axillary lymph nodes. Thyroid gland, trachea, and esophagus demonstrate no significant findings. Lungs/Pleura: Severe centrilobular and paraseptal emphysema with redemonstrated bandlike scarring and volume loss of the right lung base along with severe elevation of the right hemidiaphragm. Stable, benign 8 mm  pulmonary nodule of the left lower lobe (series 4, image 63). No pleural effusion or pneumothorax. Musculoskeletal: No chest wall mass or suspicious bone lesions identified. Severe right glenohumeral arthrosis with large pannus or calcified loose bodies within the subscapular joint recess. CT ABDOMEN PELVIS FINDINGS Hepatobiliary: No solid liver abnormality is seen. No gallstones, gallbladder wall thickening, or biliary dilatation. Pancreas: Unremarkable. No pancreatic ductal dilatation or surrounding inflammatory changes. Spleen: Normal in size without significant abnormality. Adrenals/Urinary Tract: Adrenal glands are unremarkable. Multiple bilateral exophytic renal cysts. Kidneys are otherwise normal, without renal calculi, solid lesion, or hydronephrosis. Bladder is unremarkable. Stomach/Bowel: Stomach is within normal limits. Appendix is surgically absent status post sigmorectal resection and reanastomosis. Vascular/Lymphatic:  Aortic atherosclerosis. Unchanged 1.5 cm focal ectasia of the distal right common iliac artery. No enlarged abdominal or pelvic lymph nodes. Reproductive: No mass or other abnormality. Other: There is a large right inguinal hernia containing multiple loops of nonobstructed distal small bowel. No abdominopelvic ascites. Musculoskeletal: No acute or significant osseous findings. IMPRESSION: 1. Postoperative findings of sigmorectal resection without evidence of recurrent mass, lymphadenopathy, or distant metastatic disease. 2.  Stable, benign 8 mm pulmonary nodule of the left lower lobe. 3. Large right inguinal hernia containing multiple loops of nonobstructed distal small bowel. 4. Coronary artery disease. Aortic Atherosclerosis (ICD10-I70.0) and Emphysema (ICD10-J43.9). Electronically Signed   By: Eddie Candle M.D.   On: 09/25/2018 11:07    ASSESSMENT: Pathologic stage IIIc adenocarcinoma of the rectosigmoid colon.   PLAN:    1. 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 He subsequently underwent partial colectomy and adjuvant chemotherapy which was completed on July 15, 2011. He then completed 5-FU and XRT in October 2013. His most recent colonoscopy on April 21, 2015 was reported as negative.  Patient CEA continues to trend upward and is now 11.3.  CT scan results from September 25, 2018 reviewed independently and report as above with no obvious evidence of recurrent or metastatic disease.  Patient has now agreed to do a repeat colonoscopy which will happen in October 2020.  If colonoscopy is negative, return to clinic in 3 months laboratory work only and then in 6 months for laboratory work and further evaluation.  If patient has a positive colonoscopy, will arrange follow-up sooner.   2.  Back pain: Chronic and unchanged.    I provided 15 minutes of non face-to-face telephone visit time during this encounter, and > 50% was spent  counseling as documented under my assessment & plan.   Patient expressed understanding and was in agreement with this plan. He also understands that He can call clinic at any time with any questions, concerns, or complaints.    Lloyd Huger, MD   09/28/2018 1:39 PM

## 2018-11-02 ENCOUNTER — Other Ambulatory Visit
Admission: RE | Admit: 2018-11-02 | Discharge: 2018-11-02 | Disposition: A | Discharge status: Home / Self Care | Payer: Medicare Other | Source: Ambulatory Visit | Attending: General Surgery | Admitting: General Surgery

## 2018-11-02 DIAGNOSIS — Z01812 Encounter for preprocedural laboratory examination: Secondary | ICD-10-CM | POA: Insufficient documentation

## 2018-11-02 DIAGNOSIS — Z20828 Contact with and (suspected) exposure to other viral communicable diseases: Secondary | ICD-10-CM | POA: Diagnosis not present

## 2018-11-02 LAB — SARS CORONAVIRUS 2 (TAT 6-24 HRS): SARS Coronavirus 2: NEGATIVE

## 2018-11-07 ENCOUNTER — Encounter
Admission: RE | Disposition: A | Discharge status: Home / Self Care | Payer: Self-pay | Source: Home / Self Care | Attending: General Surgery

## 2018-11-07 ENCOUNTER — Other Ambulatory Visit: Payer: Self-pay

## 2018-11-07 ENCOUNTER — Ambulatory Visit
Admission: RE | Admit: 2018-11-07 | Discharge: 2018-11-07 | Disposition: A | Discharge status: Home / Self Care | Payer: Medicare Other | Attending: General Surgery | Admitting: General Surgery

## 2018-11-07 ENCOUNTER — Ambulatory Visit: Payer: Medicare Other | Admitting: Anesthesiology

## 2018-11-07 DIAGNOSIS — Z1211 Encounter for screening for malignant neoplasm of colon: Secondary | ICD-10-CM | POA: Insufficient documentation

## 2018-11-07 DIAGNOSIS — E119 Type 2 diabetes mellitus without complications: Secondary | ICD-10-CM | POA: Diagnosis not present

## 2018-11-07 DIAGNOSIS — M19011 Primary osteoarthritis, right shoulder: Secondary | ICD-10-CM | POA: Insufficient documentation

## 2018-11-07 DIAGNOSIS — Z7982 Long term (current) use of aspirin: Secondary | ICD-10-CM | POA: Diagnosis not present

## 2018-11-07 DIAGNOSIS — I10 Essential (primary) hypertension: Secondary | ICD-10-CM | POA: Insufficient documentation

## 2018-11-07 DIAGNOSIS — D123 Benign neoplasm of transverse colon: Secondary | ICD-10-CM | POA: Insufficient documentation

## 2018-11-07 DIAGNOSIS — F17210 Nicotine dependence, cigarettes, uncomplicated: Secondary | ICD-10-CM | POA: Insufficient documentation

## 2018-11-07 DIAGNOSIS — Z79899 Other long term (current) drug therapy: Secondary | ICD-10-CM | POA: Diagnosis not present

## 2018-11-07 DIAGNOSIS — M19012 Primary osteoarthritis, left shoulder: Secondary | ICD-10-CM | POA: Insufficient documentation

## 2018-11-07 DIAGNOSIS — K5730 Diverticulosis of large intestine without perforation or abscess without bleeding: Secondary | ICD-10-CM | POA: Insufficient documentation

## 2018-11-07 DIAGNOSIS — D122 Benign neoplasm of ascending colon: Secondary | ICD-10-CM | POA: Diagnosis not present

## 2018-11-07 DIAGNOSIS — J449 Chronic obstructive pulmonary disease, unspecified: Secondary | ICD-10-CM | POA: Insufficient documentation

## 2018-11-07 DIAGNOSIS — Z85038 Personal history of other malignant neoplasm of large intestine: Secondary | ICD-10-CM | POA: Diagnosis present

## 2018-11-07 HISTORY — PX: COLONOSCOPY WITH PROPOFOL: SHX5780

## 2018-11-07 HISTORY — DX: Tobacco use: Z72.0

## 2018-11-07 HISTORY — DX: Elevated prostate specific antigen (PSA): R97.20

## 2018-11-07 HISTORY — DX: Chronic obstructive pulmonary disease, unspecified: J44.9

## 2018-11-07 SURGERY — COLONOSCOPY WITH PROPOFOL
Anesthesia: General

## 2018-11-07 MED ORDER — PROPOFOL 500 MG/50ML IV EMUL
INTRAVENOUS | Status: DC | PRN
  Administered 2018-11-07: 140 ug/kg/min via INTRAVENOUS

## 2018-11-07 MED ORDER — PROPOFOL 10 MG/ML IV BOLUS
INTRAVENOUS | Status: DC | PRN
  Administered 2018-11-07: 70 mg via INTRAVENOUS

## 2018-11-07 MED ORDER — SODIUM CHLORIDE 0.9 % IV SOLN
INTRAVENOUS | Status: DC
  Administered 2018-11-07: 1000 mL via INTRAVENOUS

## 2018-11-07 MED ORDER — PHENYLEPHRINE HCL (PRESSORS) 10 MG/ML IV SOLN
INTRAVENOUS | Status: DC | PRN
  Administered 2018-11-07: 100 ug via INTRAVENOUS
  Administered 2018-11-07 (×2): 200 ug via INTRAVENOUS

## 2018-11-07 NOTE — Anesthesia Preprocedure Evaluation (Signed)
Anesthesia Evaluation  Patient identified by MRN, date of birth, ID band Patient awake    Reviewed: Allergy & Precautions, NPO status , Patient's Chart, lab work & pertinent test results  History of Anesthesia Complications Negative for: history of anesthetic complications  Airway Mallampati: II       Dental   Pulmonary neg sleep apnea, COPD (mild, no inhalers), Current Smoker,           Cardiovascular hypertension, Pt. on medications (-) Past MI and (-) CHF (-) dysrhythmias (-) Valvular Problems/Murmurs     Neuro/Psych neg Seizures    GI/Hepatic negative GI ROS, Neg liver ROS,   Endo/Other  neg diabetes  Renal/GU negative Renal ROS     Musculoskeletal   Abdominal   Peds  Hematology   Anesthesia Other Findings   Reproductive/Obstetrics                             Anesthesia Physical Anesthesia Plan  ASA: II  Anesthesia Plan: General   Post-op Pain Management:    Induction: Intravenous  PONV Risk Score and Plan: 1 and Propofol infusion and TIVA  Airway Management Planned: Nasal Cannula  Additional Equipment:   Intra-op Plan:   Post-operative Plan:   Informed Consent: I have reviewed the patients History and Physical, chart, labs and discussed the procedure including the risks, benefits and alternatives for the proposed anesthesia with the patient or authorized representative who has indicated his/her understanding and acceptance.       Plan Discussed with:   Anesthesia Plan Comments:         Anesthesia Quick Evaluation

## 2018-11-07 NOTE — Transfer of Care (Signed)
Immediate Anesthesia Transfer of Care Note  Patient: Cameron Howe  Procedure(s) Performed: COLONOSCOPY WITH PROPOFOL (N/A )  Patient Location: PACU  Anesthesia Type:General  Level of Consciousness: awake, alert  and oriented  Airway & Oxygen Therapy: Patient Spontanous Breathing and Patient connected to nasal cannula oxygen  Post-op Assessment: Report given to RN and Post -op Vital signs reviewed and stable  Post vital signs: Reviewed and stable  Last Vitals:  Vitals Value Taken Time  BP 121/68 11/07/18 0922  Temp 36.4 C 11/07/18 0922  Pulse 78 11/07/18 0922  Resp 30 11/07/18 0922  SpO2 98 % 11/07/18 0922    Last Pain:  Vitals:   11/07/18 0922  TempSrc:   PainSc: 0-No pain         Complications: No apparent anesthesia complications

## 2018-11-07 NOTE — H&P (Signed)
Cameron Howe [id number] 1945/06/12     HPI:  73 y/o male 8 years s/p sigmoid resection by Sherri Rad, MD for colon cancer.  Rising CEA with normal CT.  For colonoscopy. Last exam in 2017 showed three small tubular adenomas in the right colon.   Medications Prior to Admission  Medication Sig Dispense Refill Last Dose  . acetaminophen (TYLENOL) 650 MG CR tablet Take 650 mg by mouth every 8 (eight) hours as needed for pain.   11/06/2018 at Unknown time  . amLODipine (NORVASC) 5 MG tablet Take 5 mg by mouth daily.   11/07/2018 at 0605  . aspirin 81 MG tablet Take 81 mg by mouth daily. 800AM   11/06/2018 at Unknown time  . hydrochlorothiazide (HYDRODIURIL) 12.5 MG tablet Take 12.5 mg by mouth daily.   11/06/2018 at Unknown time  . Ibuprofen 200 MG CAPS Take by mouth every 6 (six) hours as needed.   Past Week at Unknown time  . losartan (COZAAR) 100 MG tablet Take 100 mg by mouth daily.   11/07/2018 at 0605   No Known Allergies Past Medical History:  Diagnosis Date  . Arthritis    SHOULDERS AND JOINTS  . Cancer [facility])    Colon cancer 11/2010 with parital resection with chemo and rad tx  . COPD (chronic obstructive pulmonary disease) ([place])   . Dental bridge present    REMOVABLE  . Diabetes mellitus without complication ([facility])    TYPE 2, DIET CONTROLLED  . Elevated PSA   . Hypertension   . Neuromuscular disorder ([facility])    WEAKNESS AND NUMBNESS HANDS AND FEET, POSSIBLY FROM CHEMO  . Tobacco abuse    Past Surgical History:  Procedure Laterality Date  . CERVICAL DISCECTOMY    . CERVICAL FUSION    . COLON SURGERY  NOV 2012   COLON REMOVED  . COLON SURGERY  APRIL 2014   OSTOMY REMOVED  . COLONOSCOPY N/A 04/21/2015   Procedure: COLONOSCOPY;  Surgeon: Hulen Luster, MD;  Location: [place];  Service: Gastroenterology;  Laterality: N/A;  DIABETIC, DIET CONTROLLED  . COLOSTOMY REVISION    . hx of colostomy    . LAMINECTOMY    . POLYPECTOMY N/A 04/21/2015   Procedure: POLYPECTOMY;   Surgeon: Hulen Luster, MD;  Location: [place];  Service: Gastroenterology;  Laterality: N/A;  . TUNNELED VENOUS PORT PLACEMENT     Social History   Socioeconomic History  . Marital status: Widowed    Spouse name: Not on file  . Number of children: Not on file  . Years of education: Not on file  . Highest education level: Not on file  Occupational History  . Not on file  Social Needs  . Financial resource strain: Not on file  . Food insecurity    Worry: Not on file    Inability: Not on file  . Transportation needs    Medical: Not on file    Non-medical: Not on file  Tobacco Use  . Smoking status: Current Every Day Smoker    Packs/day: 0.50    Years: 30.00    Pack years: 15.00    Types: Cigarettes  . Smokeless tobacco: Never Used  Substance and Sexual Activity  . Alcohol use: No    Alcohol/week: 0.0 standard drinks  . Drug use: No  . Sexual activity: Not on file  Lifestyle  . Physical activity    Days per week: Not on file    Minutes per session: Not  on file  . Stress: Not on file  Relationships  . Social Herbalist on phone: Not on file    Gets together: Not on file    Attends religious service: Not on file    Active member of club or organization: Not on file    Attends meetings of clubs or organizations: Not on file    Relationship status: Not on file  . Intimate partner violence    Fear of current or ex partner: Not on file    Emotionally abused: Not on file    Physically abused: Not on file    Forced sexual activity: Not on file  Other Topics Concern  . Not on file  Social History Narrative  . Not on file   Social History   Social History Narrative  . Not on file     ROS: Negative.     PE: HEENT: Negative. Lungs: Clear. Cardio: RR.   Assessment/Plan:  Proceed with planned endoscopy. Forest Gleason Daphnie Venturini 11/07/2018   Assessment/Plan:  Proceed with planned endoscopy.

## 2018-11-07 NOTE — Anesthesia Post-op Follow-up Note (Signed)
Anesthesia QCDR form completed.        

## 2018-11-07 NOTE — Op Note (Signed)
[facility] Gastroenterology Patient Name: Cameron Howe Procedure Date: 11/07/2018 8:50 AM MRN: [id number] Account #: [id number] Date of Birth: 12-21-1945 Admit Type: Outpatient Age: 73 Room: [facility] ENDO ROOM 1 Gender: Male Note Status: Finalized Procedure:            Colonoscopy Indications:          High risk colon cancer surveillance: Personal history                        of colon cancer Providers:            Robert Bellow, MD Referring MD:         Tracie Harrier, MD (Referring MD) Medicines:            Monitored Anesthesia Care Complications:        No immediate complications. Procedure:            Pre-Anesthesia Assessment:                       - Prior to the procedure, a History and Physical was                        performed, and patient medications, allergies and                        sensitivities were reviewed. The patient's tolerance of                        previous anesthesia was reviewed.                       - The risks and benefits of the procedure and the                        sedation options and risks were discussed with the                        patient. All questions were answered and informed                        consent was obtained.                       After obtaining informed consent, the colonoscope was                        passed under direct vision. Throughout the procedure,                        the patient's blood pressure, pulse, and oxygen                        saturations were monitored continuously. The                        Colonoscope was introduced through the anus and                        advanced to the the cecum, identified by appendiceal  orifice and ileocecal valve. The colonoscopy was                        performed without difficulty. The patient tolerated the                        procedure well. The quality of the bowel preparation                        was  excellent. Findings:      Two sessile polyps were found in the distal transverse colon and mid       ascending colon. The polyps were 10 mm in size. These polyps were       removed with a hot snare. Resection and retrieval were complete.      A single medium-mouthed diverticulum was found in the ascending colon.      The retroflexed view of the distal rectum and anal verge was normal and       showed no anal or rectal abnormalities. Impression:           - Two 10 mm polyps in the distal transverse colon and                        in the mid ascending colon, removed with a hot snare.                        Resected and retrieved.                       - Diverticulosis in the ascending colon.                       - The distal rectum and anal verge are normal on                        retroflexion view. Recommendation:       - Telephone endoscopist for pathology results in 1 week. Procedure Code(s):    --- Professional ---                       [id number], Colonoscopy, flexible; with removal of tumor(s),                        polyp(s), or other lesion(s) by snare technique Diagnosis Code(s):    --- Professional ---                       [id number], Personal history of other malignant neoplasm                        of large intestine                       K63.5, Polyp of colon                       K57.30, Diverticulosis of large intestine without                        perforation or abscess without bleeding CPT copyright 2019 American Medical Association. All rights reserved. The codes documented in this report  are preliminary and upon coder review may  be revised to meet current compliance requirements. Robert Bellow, MD 11/07/2018 9:12:42 AM This report has been signed electronically. Number of Addenda: 0 Note Initiated On: 11/07/2018 8:50 AM Scope Withdrawal Time: 0 hours 8 minutes 18 seconds  Total Procedure Duration: 0 hours 14 minutes 25 seconds       [facility]

## 2018-11-07 NOTE — Anesthesia Postprocedure Evaluation (Signed)
Anesthesia Post Note  Patient: Cameron Howe  Procedure(s) Performed: COLONOSCOPY WITH PROPOFOL (N/A )  Patient location during evaluation: Endoscopy Anesthesia Type: General Level of consciousness: awake and alert Pain management: pain level controlled Vital Signs Assessment: post-procedure vital signs reviewed and stable Respiratory status: spontaneous breathing and respiratory function stable Cardiovascular status: stable Anesthetic complications: no     Last Vitals:  Vitals:   11/07/18 0924 11/07/18 0934  BP: 135/82 135/75  Pulse:    Resp:  (!) 22  Temp:    SpO2:      Last Pain:  Vitals:   11/07/18 0944  TempSrc:   PainSc: 0-No pain                 KEPHART,WILLIAM K

## 2018-11-08 ENCOUNTER — Encounter: Payer: Self-pay | Admitting: General Surgery

## 2018-11-08 LAB — SURGICAL PATHOLOGY

## 2018-12-27 ENCOUNTER — Other Ambulatory Visit: Payer: Self-pay

## 2018-12-28 ENCOUNTER — Inpatient Hospital Stay: Payer: Medicare Other | Attending: Oncology

## 2018-12-28 DIAGNOSIS — Z9221 Personal history of antineoplastic chemotherapy: Secondary | ICD-10-CM | POA: Insufficient documentation

## 2018-12-28 DIAGNOSIS — Z9049 Acquired absence of other specified parts of digestive tract: Secondary | ICD-10-CM | POA: Diagnosis not present

## 2018-12-28 DIAGNOSIS — G8929 Other chronic pain: Secondary | ICD-10-CM | POA: Insufficient documentation

## 2018-12-28 DIAGNOSIS — Z85048 Personal history of other malignant neoplasm of rectum, rectosigmoid junction, and anus: Secondary | ICD-10-CM | POA: Insufficient documentation

## 2018-12-28 DIAGNOSIS — Z923 Personal history of irradiation: Secondary | ICD-10-CM | POA: Diagnosis not present

## 2018-12-28 DIAGNOSIS — Z79899 Other long term (current) drug therapy: Secondary | ICD-10-CM | POA: Insufficient documentation

## 2018-12-28 DIAGNOSIS — F17210 Nicotine dependence, cigarettes, uncomplicated: Secondary | ICD-10-CM | POA: Insufficient documentation

## 2018-12-28 DIAGNOSIS — C187 Malignant neoplasm of sigmoid colon: Secondary | ICD-10-CM

## 2018-12-28 DIAGNOSIS — M549 Dorsalgia, unspecified: Secondary | ICD-10-CM | POA: Insufficient documentation

## 2018-12-28 LAB — CBC WITH DIFFERENTIAL/PLATELET
Abs Immature Granulocytes: 0.01 10*3/uL (ref 0.00–0.07)
Basophils Absolute: 0 10*3/uL (ref 0.0–0.1)
Basophils Relative: 0 %
Eosinophils Absolute: 0.1 10*3/uL (ref 0.0–0.5)
Eosinophils Relative: 4 %
HCT: 51 % (ref 39.0–52.0)
Hemoglobin: 16.7 g/dL (ref 13.0–17.0)
Immature Granulocytes: 0 %
Lymphocytes Relative: 41 %
Lymphs Abs: 1.5 10*3/uL (ref 0.7–4.0)
MCH: 28.9 pg (ref 26.0–34.0)
MCHC: 32.7 g/dL (ref 30.0–36.0)
MCV: 88.4 fL (ref 80.0–100.0)
Monocytes Absolute: 0.4 10*3/uL (ref 0.1–1.0)
Monocytes Relative: 12 %
Neutro Abs: 1.6 10*3/uL — ABNORMAL LOW (ref 1.7–7.7)
Neutrophils Relative %: 43 %
Platelets: 159 10*3/uL (ref 150–400)
RBC: 5.77 MIL/uL (ref 4.22–5.81)
RDW: 14.5 % (ref 11.5–15.5)
WBC: 3.7 10*3/uL — ABNORMAL LOW (ref 4.0–10.5)
nRBC: 0 % (ref 0.0–0.2)

## 2018-12-28 MED ORDER — SODIUM CHLORIDE 0.9% FLUSH
10.0000 mL | INTRAVENOUS | Status: DC | PRN
  Administered 2018-12-28: 10 mL via INTRAVENOUS
  Filled 2018-12-28: qty 10

## 2018-12-28 MED ORDER — HEPARIN SOD (PORK) LOCK FLUSH 100 UNIT/ML IV SOLN
500.0000 [IU] | Freq: Once | INTRAVENOUS | Status: AC
  Administered 2018-12-28: 14:00:00 500 [IU] via INTRAVENOUS
  Filled 2018-12-28: qty 5

## 2018-12-29 LAB — CEA: CEA: 11.6 ng/mL — ABNORMAL HIGH (ref 0.0–4.7)

## 2019-03-22 ENCOUNTER — Other Ambulatory Visit: Payer: Self-pay

## 2019-03-22 DIAGNOSIS — C187 Malignant neoplasm of sigmoid colon: Secondary | ICD-10-CM

## 2019-03-22 NOTE — Progress Notes (Deleted)
[place]  Telephone:(336) [phone]  Fax:(336) [place] DOB: 1945/12/02  MR#: [id number]  [id number]  Patient Care Team: Tracie Harrier, MD as PCP - General (Internal Medicine) Manya Silvas, MD (Inactive) (Gastroenterology) Clent Jacks, RN as Oncology Nurse Navigator  I connected with Yaakov Guthrie on 03/22/19 at  2:00 PM EDT by telephone visit and verified that I am speaking with the correct person using two identifiers.   I discussed the limitations, risks, security and privacy concerns of performing an evaluation and management service by telemedicine and the availability of in-person appointments. I also discussed with the patient that there may be a patient responsible charge related to this service. The patient expressed understanding and agreed to proceed.   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  INTERVAL HISTORY: Patient agreed for further evaluation and discussion of his imaging results by telephone.  He continues to feel well and remains asymptomatic. He has no neurologic complaints. He denies any recent fevers or illnesses. He has a good appetite and denies weight loss.  He denies any chest pain, shortness of breath, cough, or hemoptysis.  He denies any nausea, vomiting, constipation, or diarrhea. He has noted no changes in his bowel movements and denies any melena or hematochezia. He has no urinary complaints.  Patient feels at his baseline offers no specific complaints today.  REVIEW OF SYSTEMS:   Review of Systems  Constitutional: Negative.  Negative for fever, malaise/fatigue and weight loss.  Respiratory: Negative.  Negative for cough, hemoptysis and shortness of breath.   Cardiovascular: Negative.  Negative for chest pain and leg swelling.    Gastrointestinal: Negative.  Negative for abdominal pain, blood in stool, constipation, diarrhea, melena, nausea and vomiting.  Genitourinary: Negative.  Negative for dysuria.  Musculoskeletal: Positive for back pain.  Skin: Negative.  Negative for rash.  Neurological: Negative.  Negative for focal weakness, weakness and headaches.  Psychiatric/Behavioral: Negative.  The patient is not nervous/anxious.     As per HPI. Otherwise, a complete review of systems is negative.  ONCOLOGY HISTORY: Oncology History   No history exists.    PAST MEDICAL HISTORY: Past Medical History:  Diagnosis Date  . Arthritis    SHOULDERS AND JOINTS  . Cancer [facility])    Colon cancer 11/2010 with parital resection with chemo and rad tx  . COPD (chronic obstructive pulmonary disease) ([place])   . Dental bridge present    REMOVABLE  . Diabetes mellitus without complication (HCC)    TYPE 2, DIET CONTROLLED  . Elevated PSA   . Hypertension   . Neuromuscular disorder (HCC)    WEAKNESS AND NUMBNESS HANDS AND FEET, POSSIBLY FROM CHEMO  . Tobacco abuse     PAST SURGICAL HISTORY: Past Surgical History:  Procedure Laterality Date  . CERVICAL DISCECTOMY    . CERVICAL FUSION    . COLON SURGERY  NOV 2012   COLON REMOVED  . COLON SURGERY  APRIL 2014   OSTOMY REMOVED  . COLONOSCOPY N/A 04/21/2015   Procedure: COLONOSCOPY;  Surgeon: Hulen Luster, MD;  Location: [place];  Service: Gastroenterology;  Laterality: N/A;  DIABETIC, DIET CONTROLLED  . COLONOSCOPY WITH PROPOFOL N/A 11/07/2018   Procedure: COLONOSCOPY WITH PROPOFOL;  Surgeon: Robert Bellow, MD;  Location: [facility] ENDOSCOPY;  Service: Endoscopy;  Laterality: N/A;  .  COLOSTOMY REVISION    . hx of colostomy    . LAMINECTOMY    . POLYPECTOMY N/A 04/21/2015   Procedure: POLYPECTOMY;  Surgeon: Hulen Luster, MD;  Location: [place];  Service: Gastroenterology;  Laterality: N/A;  . TUNNELED VENOUS PORT PLACEMENT      FAMILY HISTORY:  Reviewed and unchanged. No reported history of malignancy or chronic disease.   ADVANCED DIRECTIVES:    HEALTH MAINTENANCE: Social History   Tobacco Use  . Smoking status: Current Every Day Smoker    Packs/day: 0.50    Years: 30.00    Pack years: 15.00    Types: Cigarettes  . Smokeless tobacco: Never Used  Substance Use Topics  . Alcohol use: No    Alcohol/week: 0.0 standard drinks  . Drug use: No    No Known Allergies  Current Outpatient Medications  Medication Sig Dispense Refill  . acetaminophen (TYLENOL) 650 MG CR tablet Take 650 mg by mouth every 8 (eight) hours as needed for pain.    Marland Kitchen amLODipine (NORVASC) 5 MG tablet Take 5 mg by mouth daily.    Marland Kitchen aspirin 81 MG tablet Take 81 mg by mouth daily. 800AM    . hydrochlorothiazide (HYDRODIURIL) 12.5 MG tablet Take 12.5 mg by mouth daily.    . Ibuprofen 200 MG CAPS Take by mouth every 6 (six) hours as needed.    Marland Kitchen losartan (COZAAR) 100 MG tablet Take 100 mg by mouth daily.     No current facility-administered medications for this visit.   Facility-Administered Medications Ordered in Other Visits  Medication Dose Route Frequency Provider Last Rate Last Admin  . sodium chloride flush (NS) 0.9 % injection 10 mL  10 mL Intravenous PRN Lloyd Huger, MD   10 mL at 02/16/18 [id number]    OBJECTIVE: There were no vitals taken for this visit.   There is no height or weight on file to calculate BMI.    ECOG FS:0 - Asymptomatic  LAB RESULTS:  No visits with results within 3 Day(s) from this visit.  Latest known visit with results is:  Infusion on 12/28/2018  Component Date Value Ref Range Status  . CEA 12/28/2018 11.6* 0.0 - 4.7 ng/mL Final   Comment: (NOTE)                             Nonsmokers          <3.9                             Smokers             <5.6 [employer] Electrochemiluminescence Immunoassay (ECLIA) Values obtained with different assay methods or kits cannot be used interchangeably.  Results  cannot be interpreted as absolute evidence of the presence or absence of malignant disease. Performed At: [facility] [place], [place] [id number] Rush Farmer MD [id number]   . WBC 12/28/2018 3.7* 4.0 - 10.5 K/uL Final  . RBC 12/28/2018 5.77  4.22 - 5.81 MIL/uL Final  . Hemoglobin 12/28/2018 16.7  13.0 - 17.0 g/dL Final  . HCT 12/28/2018 51.0  39.0 - 52.0 % Final  . MCV 12/28/2018 88.4  80.0 - 100.0 fL Final  . MCH 12/28/2018 28.9  26.0 - 34.0 pg Final  . MCHC 12/28/2018 32.7  30.0 - 36.0 g/dL Final  . RDW 12/28/2018 14.5  11.5 -  15.5 % Final  . Platelets 12/28/2018 159  150 - 400 K/uL Final  . nRBC 12/28/2018 0.0  0.0 - 0.2 % Final  . Neutrophils Relative % 12/28/2018 43  % Final  . Neutro Abs 12/28/2018 1.6* 1.7 - 7.7 K/uL Final  . Lymphocytes Relative 12/28/2018 41  % Final  . Lymphs Abs 12/28/2018 1.5  0.7 - 4.0 K/uL Final  . Monocytes Relative 12/28/2018 12  % Final  . Monocytes Absolute 12/28/2018 0.4  0.1 - 1.0 K/uL Final  . Eosinophils Relative 12/28/2018 4  % Final  . Eosinophils Absolute 12/28/2018 0.1  0.0 - 0.5 K/uL Final  . Basophils Relative 12/28/2018 0  % Final  . Basophils Absolute 12/28/2018 0.0  0.0 - 0.1 K/uL Final  . Immature Granulocytes 12/28/2018 0  % Final  . Abs Immature Granulocytes 12/28/2018 0.01  0.00 - 0.07 K/uL Final   Performed at [facility], [address] [place], [place] [address]    STUDIES: No results found.  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   PLAN:    1. 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 He subsequently underwent partial colectomy and adjuvant chemotherapy which was completed on July 15, 2011. He then completed 5-FU and XRT in October 2013. His most recent colonoscopy on April 21, 2015 was reported as negative.  Patient CEA continues to trend upward and is now 11.3.  CT scan  results from September 25, 2018 reviewed independently and report as above with no obvious evidence of recurrent or metastatic disease.  Patient has now agreed to do a repeat colonoscopy which will happen in October 2020.  If colonoscopy is negative, return to clinic in 3 months laboratory work only and then in 6 months for laboratory work and further evaluation.  If patient has a positive colonoscopy, will arrange follow-up sooner.   2.  Back pain: Chronic and unchanged.    I provided 15 minutes of non face-to-face telephone visit time during this encounter, and > 50% was spent counseling as documented under my assessment & plan.   Patient expressed understanding and was in agreement with this plan. He also understands that He can call clinic at any time with any questions, concerns, or complaints.    Lloyd Huger, MD   03/22/2019 12:14 PM

## 2019-03-27 ENCOUNTER — Inpatient Hospital Stay: Payer: Medicare Other | Attending: Oncology

## 2019-03-27 ENCOUNTER — Other Ambulatory Visit: Payer: Self-pay

## 2019-03-27 ENCOUNTER — Inpatient Hospital Stay: Payer: Medicare Other

## 2019-03-27 DIAGNOSIS — D751 Secondary polycythemia: Secondary | ICD-10-CM | POA: Insufficient documentation

## 2019-03-27 DIAGNOSIS — C187 Malignant neoplasm of sigmoid colon: Secondary | ICD-10-CM

## 2019-03-27 DIAGNOSIS — Z9221 Personal history of antineoplastic chemotherapy: Secondary | ICD-10-CM | POA: Diagnosis not present

## 2019-03-27 DIAGNOSIS — Z923 Personal history of irradiation: Secondary | ICD-10-CM | POA: Diagnosis not present

## 2019-03-27 DIAGNOSIS — Z79899 Other long term (current) drug therapy: Secondary | ICD-10-CM | POA: Insufficient documentation

## 2019-03-27 DIAGNOSIS — Z85048 Personal history of other malignant neoplasm of rectum, rectosigmoid junction, and anus: Secondary | ICD-10-CM | POA: Insufficient documentation

## 2019-03-27 DIAGNOSIS — F17210 Nicotine dependence, cigarettes, uncomplicated: Secondary | ICD-10-CM | POA: Diagnosis not present

## 2019-03-27 LAB — CBC WITH DIFFERENTIAL/PLATELET
Abs Immature Granulocytes: 0.01 10*3/uL (ref 0.00–0.07)
Basophils Absolute: 0 10*3/uL (ref 0.0–0.1)
Basophils Relative: 0 %
Eosinophils Absolute: 0.1 10*3/uL (ref 0.0–0.5)
Eosinophils Relative: 3 %
HCT: 52.1 % — ABNORMAL HIGH (ref 39.0–52.0)
Hemoglobin: 17.1 g/dL — ABNORMAL HIGH (ref 13.0–17.0)
Immature Granulocytes: 0 %
Lymphocytes Relative: 34 %
Lymphs Abs: 1.6 10*3/uL (ref 0.7–4.0)
MCH: 29.1 pg (ref 26.0–34.0)
MCHC: 32.8 g/dL (ref 30.0–36.0)
MCV: 88.8 fL (ref 80.0–100.0)
Monocytes Absolute: 0.4 10*3/uL (ref 0.1–1.0)
Monocytes Relative: 9 %
Neutro Abs: 2.5 10*3/uL (ref 1.7–7.7)
Neutrophils Relative %: 54 %
Platelets: 150 10*3/uL (ref 150–400)
RBC: 5.87 MIL/uL — ABNORMAL HIGH (ref 4.22–5.81)
RDW: 14.3 % (ref 11.5–15.5)
WBC: 4.7 10*3/uL (ref 4.0–10.5)
nRBC: 0 % (ref 0.0–0.2)

## 2019-03-27 MED ORDER — HEPARIN SOD (PORK) LOCK FLUSH 100 UNIT/ML IV SOLN
500.0000 [IU] | Freq: Once | INTRAVENOUS | Status: AC
  Administered 2019-03-27: 500 [IU] via INTRAVENOUS
  Filled 2019-03-27: qty 5

## 2019-03-27 MED ORDER — SODIUM CHLORIDE 0.9% FLUSH
10.0000 mL | INTRAVENOUS | Status: DC | PRN
  Administered 2019-03-27: 10 mL via INTRAVENOUS
  Filled 2019-03-27: qty 10

## 2019-03-28 LAB — CEA: CEA: 13.6 ng/mL — ABNORMAL HIGH (ref 0.0–4.7)

## 2019-03-29 ENCOUNTER — Other Ambulatory Visit: Payer: Medicare Other

## 2019-03-29 ENCOUNTER — Encounter: Payer: Self-pay | Admitting: Oncology

## 2019-03-29 ENCOUNTER — Inpatient Hospital Stay: Payer: Medicare Other | Admitting: Oncology

## 2019-03-29 NOTE — Progress Notes (Signed)
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  INTERVAL HISTORY: Patient returns to clinic today for repeat laboratory work and routine 56-month evaluation.  He continues to feel well and remains asymptomatic.  He has no neurologic complaints. He denies any recent fevers or illnesses. He has a good appetite and denies weight loss.  He denies any chest pain, shortness of breath, cough, or hemoptysis.  He denies any nausea, vomiting, constipation, or diarrhea. He has noted no changes in his bowel movements and denies any melena or hematochezia. He has no urinary complaints.  Patient offers no specific complaints today.  REVIEW OF SYSTEMS:   Review of Systems  Constitutional: Negative.  Negative for fever, malaise/fatigue and weight loss.  Respiratory: Negative.  Negative for cough, hemoptysis and shortness of breath.   Cardiovascular: Negative.  Negative for chest pain and leg swelling.  Gastrointestinal: Negative.  Negative for abdominal pain, blood in stool, constipation, diarrhea, melena, nausea and vomiting.  Genitourinary: Negative.  Negative for dysuria.  Musculoskeletal: Negative.  Negative for back pain.  Skin: Negative.  Negative for rash.  Neurological: Negative.  Negative for focal weakness, weakness and headaches.  Psychiatric/Behavioral: Negative.  The patient is not nervous/anxious.     As per HPI. Otherwise, a complete review of systems is negative.  ONCOLOGY HISTORY: Oncology History   No history exists.    PAST MEDICAL HISTORY: Past Medical History:  Diagnosis Date  . Arthritis    SHOULDERS AND JOINTS  . Cancer  [facility])    Colon cancer 11/2010 with parital resection with chemo and rad tx  . COPD (chronic obstructive pulmonary disease) ([place])   . Dental bridge present    REMOVABLE  . Diabetes mellitus without complication (HCC)    TYPE 2, DIET CONTROLLED  . Elevated PSA   . Hypertension   . Neuromuscular disorder (HCC)    WEAKNESS AND NUMBNESS HANDS AND FEET, POSSIBLY FROM CHEMO  . Tobacco abuse     PAST SURGICAL HISTORY: Past Surgical History:  Procedure Laterality Date  . CERVICAL DISCECTOMY    . CERVICAL FUSION    . COLON SURGERY  NOV 2012   COLON REMOVED  . COLON SURGERY  APRIL 2014   OSTOMY REMOVED  . COLONOSCOPY N/A 04/21/2015   Procedure: COLONOSCOPY;  Surgeon: Hulen Luster, MD;  Location: [place];  Service: Gastroenterology;  Laterality: N/A;  DIABETIC, DIET CONTROLLED  . COLONOSCOPY WITH PROPOFOL N/A 11/07/2018   Procedure: COLONOSCOPY WITH PROPOFOL;  Surgeon: Robert Bellow, MD;  Location: [facility] ENDOSCOPY;  Service: Endoscopy;  Laterality: N/A;  . COLOSTOMY REVISION    . hx of colostomy    . LAMINECTOMY    . POLYPECTOMY N/A 04/21/2015   Procedure: POLYPECTOMY;  Surgeon: Hulen Luster, MD;  Location: [place];  Service: Gastroenterology;  Laterality: N/A;  . TUNNELED VENOUS PORT PLACEMENT      FAMILY HISTORY: Reviewed and unchanged. No reported history of malignancy or chronic disease.   ADVANCED DIRECTIVES:    HEALTH MAINTENANCE: Social History   Tobacco Use  . Smoking status: Current Every Day Smoker    Packs/day: 0.50    Years: 30.00    Pack years:  15.00    Types: Cigarettes  . Smokeless tobacco: Never Used  Substance Use Topics  . Alcohol use: No    Alcohol/week: 0.0 standard drinks  . Drug use: No    No Known Allergies  Current Outpatient Medications  Medication Sig Dispense Refill  . acetaminophen (TYLENOL) 650 MG CR tablet Take 650 mg by mouth every 8 (eight) hours as needed for pain.    Marland Kitchen amLODipine (NORVASC) 10 MG tablet Take 10 mg  by mouth daily.    Marland Kitchen aspirin 81 MG tablet Take 81 mg by mouth daily. 800AM    . hydrochlorothiazide (HYDRODIURIL) 12.5 MG tablet Take 12.5 mg by mouth daily.    . Ibuprofen 200 MG CAPS Take by mouth every 6 (six) hours as needed.    Marland Kitchen losartan (COZAAR) 100 MG tablet Take 100 mg by mouth daily.     No current facility-administered medications for this visit.   Facility-Administered Medications Ordered in Other Visits  Medication Dose Route Frequency Provider Last Rate Last Admin  . sodium chloride flush (NS) 0.9 % injection 10 mL  10 mL Intravenous PRN Lloyd Huger, MD   10 mL at 02/16/18 0949    OBJECTIVE: BP (!) 173/86 (BP Location: Left Arm, Patient Position: Sitting, Cuff Size: Normal)   Pulse 84   Temp 98.3 F (36.8 C) (Tympanic)   Wt 203 lb (92.1 kg)   SpO2 96%   BMI 27.53 kg/m    Body mass index is 27.53 kg/m.    ECOG FS:0 - Asymptomatic   General: Well-developed, well-nourished, no acute distress. Eyes: Pink conjunctiva, anicteric sclera. HEENT: Normocephalic, moist mucous membranes. Lungs: No audible wheezing or coughing. Heart: Regular rate and rhythm. Abdomen: Soft, nontender, no obvious distention. Musculoskeletal: No edema, cyanosis, or clubbing. Neuro: Alert, answering all questions appropriately. Cranial nerves grossly intact. Skin: No rashes or petechiae noted. Psych: Normal affect.   LAB RESULTS:  No visits with results within 3 Day(s) from this visit.  Latest known visit with results is:  Infusion on 03/27/2019  Component Date Value Ref Range Status  . CEA 03/27/2019 13.6* 0.0 - 4.7 ng/mL Final   Comment: (NOTE)                             Nonsmokers          <3.9                             Smokers             <5.6 [employer] Electrochemiluminescence Immunoassay (ECLIA) Values obtained with different assay methods or kits cannot be used interchangeably.  Results cannot be interpreted as absolute evidence of the presence or absence of  malignant disease. Performed At: [facility] [place], [place] [id number] Rush Farmer MD [id number]   . WBC 03/27/2019 4.7  4.0 - 10.5 K/uL Final  . RBC 03/27/2019 5.87* 4.22 - 5.81 MIL/uL Final  . Hemoglobin 03/27/2019 17.1* 13.0 - 17.0 g/dL Final  . HCT 03/27/2019 52.1* 39.0 - 52.0 % Final  . MCV 03/27/2019 88.8  80.0 - 100.0 fL Final  . MCH 03/27/2019 29.1  26.0 - 34.0 pg Final  . MCHC 03/27/2019 32.8  30.0 - 36.0 g/dL Final  . RDW 03/27/2019 14.3  11.5 - 15.5 % Final  . Platelets 03/27/2019 150  150 - 400 K/uL Final  .  nRBC 03/27/2019 0.0  0.0 - 0.2 % Final  . Neutrophils Relative % 03/27/2019 54  % Final  . Neutro Abs 03/27/2019 2.5  1.7 - 7.7 K/uL Final  . Lymphocytes Relative 03/27/2019 34  % Final  . Lymphs Abs 03/27/2019 1.6  0.7 - 4.0 K/uL Final  . Monocytes Relative 03/27/2019 9  % Final  . Monocytes Absolute 03/27/2019 0.4  0.1 - 1.0 K/uL Final  . Eosinophils Relative 03/27/2019 3  % Final  . Eosinophils Absolute 03/27/2019 0.1  0.0 - 0.5 K/uL Final  . Basophils Relative 03/27/2019 0  % Final  . Basophils Absolute 03/27/2019 0.0  0.0 - 0.1 K/uL Final  . Immature Granulocytes 03/27/2019 0  % Final  . Abs Immature Granulocytes 03/27/2019 0.01  0.00 - 0.07 K/uL Final   Performed at [facility], [address] [place], [place] [address]    STUDIES: No results found.  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   PLAN:    1. 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 He subsequently underwent partial colectomy and adjuvant chemotherapy which was completed on July 15, 2011. He then completed 5-FU and XRT in October 2013.  Patient had a repeat colonoscopy on November 07, 2018 that removed 2 benign polyps, but otherwise was reported within normal limits.  Recommendation was repeat in 3 years.  CT scan results from September 25, 2018  reviewed independently with no obvious evidence of recurrent or metastatic disease.  Despite no evidence of malignancy, patient CEA continues to trend up and is now 13.6.  Suspect this is secondary to his ongoing tobacco use.  No intervention is needed at this time.  Return to clinic in 6 months for repeat laboratory work and routine evaluation. 2.  Chronic tobacco use: Likely causing his elevated CEA.  Patient expressed no desire and discontinuing.  He was given a referral to the lung cancer screening clinic. 3.  Polycythemia: Likely secondary to tobacco use.   4.  Back pain: Chronic and unchanged.   Patient expressed understanding and was in agreement with this plan. He also understands that He can call clinic at any time with any questions, concerns, or complaints.    Lloyd Huger, MD   04/01/2019 11:59 AM

## 2019-03-29 NOTE — Progress Notes (Signed)
Patient is coming in for follow up he is doing ok says his back hurts mostly in the cold weather but not bad 4/10 on pain scale otherwise doing well no major complaints

## 2019-04-01 ENCOUNTER — Inpatient Hospital Stay (HOSPITAL_BASED_OUTPATIENT_CLINIC_OR_DEPARTMENT_OTHER): Payer: Medicare Other | Admitting: Oncology

## 2019-04-01 ENCOUNTER — Other Ambulatory Visit: Payer: Self-pay

## 2019-04-01 VITALS — BP 173/86 | HR 84 | Temp 98.3°F | Wt 203.0 lb

## 2019-04-01 DIAGNOSIS — Z85048 Personal history of other malignant neoplasm of rectum, rectosigmoid junction, and anus: Secondary | ICD-10-CM | POA: Diagnosis not present

## 2019-04-01 DIAGNOSIS — R911 Solitary pulmonary nodule: Secondary | ICD-10-CM

## 2019-04-01 DIAGNOSIS — C187 Malignant neoplasm of sigmoid colon: Secondary | ICD-10-CM | POA: Diagnosis not present

## 2019-04-01 DIAGNOSIS — IMO0001 Reserved for inherently not codable concepts without codable children: Secondary | ICD-10-CM

## 2019-04-18 ENCOUNTER — Telehealth: Payer: Self-pay | Admitting: *Deleted

## 2019-04-18 NOTE — Telephone Encounter (Signed)
Contacted regarding referral for lung screening scan. Explained that patient does not need until 1 year from stable lung ct done 9/20. Patient verbalizes understanding.

## 2019-07-02 ENCOUNTER — Other Ambulatory Visit: Payer: Medicare Other

## 2019-07-03 ENCOUNTER — Inpatient Hospital Stay: Payer: Medicare Other | Attending: Hematology and Oncology

## 2019-07-03 ENCOUNTER — Other Ambulatory Visit: Payer: Self-pay

## 2019-07-03 DIAGNOSIS — F17210 Nicotine dependence, cigarettes, uncomplicated: Secondary | ICD-10-CM | POA: Insufficient documentation

## 2019-07-03 DIAGNOSIS — Z79899 Other long term (current) drug therapy: Secondary | ICD-10-CM | POA: Diagnosis not present

## 2019-07-03 DIAGNOSIS — Z9221 Personal history of antineoplastic chemotherapy: Secondary | ICD-10-CM | POA: Diagnosis not present

## 2019-07-03 DIAGNOSIS — Z923 Personal history of irradiation: Secondary | ICD-10-CM | POA: Insufficient documentation

## 2019-07-03 DIAGNOSIS — Z85048 Personal history of other malignant neoplasm of rectum, rectosigmoid junction, and anus: Secondary | ICD-10-CM | POA: Diagnosis present

## 2019-07-03 DIAGNOSIS — D751 Secondary polycythemia: Secondary | ICD-10-CM | POA: Diagnosis not present

## 2019-07-03 DIAGNOSIS — C187 Malignant neoplasm of sigmoid colon: Secondary | ICD-10-CM

## 2019-07-03 LAB — CBC WITH DIFFERENTIAL/PLATELET
Abs Immature Granulocytes: 0.01 10*3/uL (ref 0.00–0.07)
Basophils Absolute: 0 10*3/uL (ref 0.0–0.1)
Basophils Relative: 1 %
Eosinophils Absolute: 0.1 10*3/uL (ref 0.0–0.5)
Eosinophils Relative: 3 %
HCT: 50.2 % (ref 39.0–52.0)
Hemoglobin: 16.6 g/dL (ref 13.0–17.0)
Immature Granulocytes: 0 %
Lymphocytes Relative: 39 %
Lymphs Abs: 1.5 10*3/uL (ref 0.7–4.0)
MCH: 29.7 pg (ref 26.0–34.0)
MCHC: 33.1 g/dL (ref 30.0–36.0)
MCV: 89.8 fL (ref 80.0–100.0)
Monocytes Absolute: 0.4 10*3/uL (ref 0.1–1.0)
Monocytes Relative: 10 %
Neutro Abs: 1.8 10*3/uL (ref 1.7–7.7)
Neutrophils Relative %: 47 %
Platelets: 153 10*3/uL (ref 150–400)
RBC: 5.59 MIL/uL (ref 4.22–5.81)
RDW: 14.4 % (ref 11.5–15.5)
WBC: 3.7 10*3/uL — ABNORMAL LOW (ref 4.0–10.5)
nRBC: 0 % (ref 0.0–0.2)

## 2019-07-03 MED ORDER — SODIUM CHLORIDE 0.9% FLUSH
10.0000 mL | INTRAVENOUS | Status: DC | PRN
  Administered 2019-07-03: 10 mL via INTRAVENOUS
  Filled 2019-07-03: qty 10

## 2019-07-03 MED ORDER — HEPARIN SOD (PORK) LOCK FLUSH 100 UNIT/ML IV SOLN
500.0000 [IU] | Freq: Once | INTRAVENOUS | Status: AC
  Administered 2019-07-03: 500 [IU] via INTRAVENOUS
  Filled 2019-07-03: qty 5

## 2019-07-05 LAB — CEA: CEA: 10.7 ng/mL — ABNORMAL HIGH (ref 0.0–4.7)

## 2019-09-29 NOTE — Progress Notes (Signed)
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  INTERVAL HISTORY: Patient returns to clinic today for repeat laboratory work and routine 79-month evaluation.  He continues to feel well and remains asymptomatic. He has no neurologic complaints. He denies any recent fevers or illnesses. He has a good appetite and denies weight loss.  He denies any chest pain, shortness of breath, cough, or hemoptysis.  He denies any nausea, vomiting, constipation, or diarrhea. He has noted no changes in his bowel movements and denies any melena or hematochezia. He has no urinary complaints.  Patient feels at his baseline offers no specific complaints today.  REVIEW OF SYSTEMS:   Review of Systems  Constitutional: Negative.  Negative for fever, malaise/fatigue and weight loss.  Respiratory: Negative.  Negative for cough, hemoptysis and shortness of breath.   Cardiovascular: Negative.  Negative for chest pain and leg swelling.  Gastrointestinal: Negative.  Negative for abdominal pain, blood in stool, constipation, diarrhea, melena, nausea and vomiting.  Genitourinary: Negative.  Negative for dysuria.  Musculoskeletal: Negative.  Negative for back pain.  Skin: Negative.  Negative for rash.  Neurological: Negative.  Negative for focal weakness, weakness and headaches.  Psychiatric/Behavioral: Negative.  The patient is not nervous/anxious.     As per HPI. Otherwise, a complete review of systems is negative.  ONCOLOGY HISTORY: Oncology History   No history exists.    PAST MEDICAL HISTORY: Past Medical History:  Diagnosis Date  . Arthritis    SHOULDERS AND  JOINTS  . Cancer [facility])    Colon cancer 11/2010 with parital resection with chemo and rad tx  . COPD (chronic obstructive pulmonary disease) ([place])   . Dental bridge present    REMOVABLE  . Diabetes mellitus without complication (HCC)    TYPE 2, DIET CONTROLLED  . Elevated PSA   . Hypertension   . Neuromuscular disorder (HCC)    WEAKNESS AND NUMBNESS HANDS AND FEET, POSSIBLY FROM CHEMO  . Tobacco abuse     PAST SURGICAL HISTORY: Past Surgical History:  Procedure Laterality Date  . CERVICAL DISCECTOMY    . CERVICAL FUSION    . COLON SURGERY  NOV 2012   COLON REMOVED  . COLON SURGERY  APRIL 2014   OSTOMY REMOVED  . COLONOSCOPY N/A 04/21/2015   Procedure: COLONOSCOPY;  Surgeon: Hulen Luster, MD;  Location: [place];  Service: Gastroenterology;  Laterality: N/A;  DIABETIC, DIET CONTROLLED  . COLONOSCOPY WITH PROPOFOL N/A 11/07/2018   Procedure: COLONOSCOPY WITH PROPOFOL;  Surgeon: Robert Bellow, MD;  Location: [facility];  Service: Endoscopy;  Laterality: N/A;  . COLOSTOMY REVISION    . hx of colostomy    . LAMINECTOMY    . POLYPECTOMY N/A 04/21/2015   Procedure: POLYPECTOMY;  Surgeon: Hulen Luster, MD;  Location: [place];  Service: Gastroenterology;  Laterality: N/A;  . TUNNELED VENOUS PORT PLACEMENT      FAMILY HISTORY: Reviewed and unchanged. No reported history of malignancy or chronic disease.   ADVANCED DIRECTIVES:    HEALTH MAINTENANCE: Social History   Tobacco Use  . Smoking status: Current Every Day Smoker    Packs/day: 0.50    Years: 30.00  Pack years: 15.00    Types: Cigarettes  . Smokeless tobacco: Never Used  Substance Use Topics  . Alcohol use: No    Alcohol/week: 0.0 standard drinks  . Drug use: No    No Known Allergies  Current Outpatient Medications  Medication Sig Dispense Refill  . acetaminophen (TYLENOL) 650 MG CR tablet Take 650 mg by mouth every 8 (eight) hours as needed for pain.    Marland Kitchen amLODipine (NORVASC) 10 MG  tablet Take 10 mg by mouth daily.    Marland Kitchen aspirin 81 MG tablet Take 81 mg by mouth daily. 800AM    . hydrochlorothiazide (HYDRODIURIL) 12.5 MG tablet Take 12.5 mg by mouth daily.    . Ibuprofen 200 MG CAPS Take by mouth every 6 (six) hours as needed.    Marland Kitchen losartan (COZAAR) 100 MG tablet Take 100 mg by mouth daily.     No current facility-administered medications for this visit.   Facility-Administered Medications Ordered in Other Visits  Medication Dose Route Frequency Provider Last Rate Last Admin  . sodium chloride flush (NS) 0.9 % injection 10 mL  10 mL Intravenous PRN Lloyd Huger, MD   10 mL at 02/16/18 0949    OBJECTIVE: BP (!) 168/84 (BP Location: Left Arm, Patient Position: Sitting, Cuff Size: Normal)   Pulse 70   Temp (!) 97.4 F (36.3 C) (Tympanic)   Resp 20   Ht 6' (1.829 m)   Wt 195 lb 4.8 oz (88.6 kg)   SpO2 96%   BMI 26.49 kg/m    Body mass index is 26.49 kg/m.    ECOG FS:0 - Asymptomatic   General: Well-developed, well-nourished, no acute distress. Eyes: Pink conjunctiva, anicteric sclera. HEENT: Normocephalic, moist mucous membranes. Lungs: No audible wheezing or coughing. Heart: Regular rate and rhythm. Abdomen: Soft, nontender, no obvious distention. Musculoskeletal: No edema, cyanosis, or clubbing. Neuro: Alert, answering all questions appropriately. Cranial nerves grossly intact. Skin: No rashes or petechiae noted. Psych: Normal affect.   LAB RESULTS:  Infusion on 10/02/2019  Component Date Value Ref Range Status  . CEA 10/02/2019 11.2* 0.0 - 4.7 ng/mL Final   Comment: (NOTE)                             Nonsmokers          <3.9                             Smokers             <5.6 [employer] Electrochemiluminescence Immunoassay (ECLIA) Values obtained with different assay methods or kits cannot be used interchangeably.  Results cannot be interpreted as absolute evidence of the presence or absence of malignant disease. Performed At: [facility] [place], [place] [id number] Rush Farmer MD ZC:[id number]   . WBC 10/02/2019 3.7* 4.0 - 10.5 K/uL Final  . RBC 10/02/2019 5.64  4.22 - 5.81 MIL/uL Final  . Hemoglobin 10/02/2019 16.8  13.0 - 17.0 g/dL Final  . HCT 10/02/2019 51.2  39 - 52 % Final  . MCV 10/02/2019 90.8  80.0 - 100.0 fL Final  . MCH 10/02/2019 29.8  26.0 - 34.0 pg Final  . MCHC 10/02/2019 32.8  30.0 - 36.0 g/dL Final  . RDW 10/02/2019 14.0  11.5 - 15.5 % Final  . Platelets 10/02/2019 164  150 - 400 K/uL Final  . nRBC  10/02/2019 0.0  0.0 - 0.2 % Final  . Neutrophils Relative % 10/02/2019 48  % Final  . Neutro Abs 10/02/2019 1.8  1.7 - 7.7 K/uL Final  . Lymphocytes Relative 10/02/2019 38  % Final  . Lymphs Abs 10/02/2019 1.4  0.7 - 4.0 K/uL Final  . Monocytes Relative 10/02/2019 11  % Final  . Monocytes Absolute 10/02/2019 0.4  0 - 1 K/uL Final  . Eosinophils Relative 10/02/2019 3  % Final  . Eosinophils Absolute 10/02/2019 0.1  0 - 0 K/uL Final  . Basophils Relative 10/02/2019 0  % Final  . Basophils Absolute 10/02/2019 0.0  0 - 0 K/uL Final  . Immature Granulocytes 10/02/2019 0  % Final  . Abs Immature Granulocytes 10/02/2019 0.01  0.00 - 0.07 K/uL Final   Performed at [facility], [address] [place], [place] [address]    STUDIES: No results found.  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   PLAN:    1. 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 He subsequently underwent partial colectomy and adjuvant chemotherapy which was completed on July 15, 2011. He then completed 5-FU and XRT in October 2013.  Patient had a repeat colonoscopy on November 07, 2018 that removed 2 benign polyps, but otherwise was reported within normal limits.  Recommendation was repeat in 3 years.  CT scan results from September 25, 2018 reviewed independently with no obvious evidence of  recurrent or metastatic disease.  Despite no evidence of malignancy, patient's CEA has been persistently elevated since at least August 2019 when it was reported at 7.9.  Today's result is 11.2.  Suspect this is secondary to his ongoing tobacco use.  No intervention is needed at this time.  Return to clinic in 6 months for laboratory work only and then in 1 year for laboratory work and further evaluation.  If everything remains stable at that point, patient will be nearly 10 years removed from completing his treatments and can likely be discharged from clinic. 2.  Chronic tobacco use: Likely causing his elevated CEA.  Patient expressed no desire and discontinuing.  Previously, he was given a referral to the lung cancer screening clinic. 3.  Polycythemia: Resolved.  Patient's hemoglobin is within normal limits today. 4.  Back pain: Patient does not complain of this today.   Patient expressed understanding and was in agreement with this plan. He also understands that He can call clinic at any time with any questions, concerns, or complaints.    Lloyd Huger, MD   10/04/2019 12:37 PM

## 2019-10-02 ENCOUNTER — Inpatient Hospital Stay: Payer: Medicare Other | Attending: Oncology

## 2019-10-02 ENCOUNTER — Other Ambulatory Visit: Payer: Self-pay

## 2019-10-02 ENCOUNTER — Other Ambulatory Visit: Payer: Medicare Other

## 2019-10-02 ENCOUNTER — Other Ambulatory Visit: Payer: Self-pay | Admitting: *Deleted

## 2019-10-02 DIAGNOSIS — Z923 Personal history of irradiation: Secondary | ICD-10-CM | POA: Insufficient documentation

## 2019-10-02 DIAGNOSIS — Z9221 Personal history of antineoplastic chemotherapy: Secondary | ICD-10-CM | POA: Insufficient documentation

## 2019-10-02 DIAGNOSIS — C187 Malignant neoplasm of sigmoid colon: Secondary | ICD-10-CM

## 2019-10-02 DIAGNOSIS — C19 Malignant neoplasm of rectosigmoid junction: Secondary | ICD-10-CM | POA: Insufficient documentation

## 2019-10-02 DIAGNOSIS — Z79899 Other long term (current) drug therapy: Secondary | ICD-10-CM | POA: Insufficient documentation

## 2019-10-02 DIAGNOSIS — Z95828 Presence of other vascular implants and grafts: Secondary | ICD-10-CM

## 2019-10-02 DIAGNOSIS — R970 Elevated carcinoembryonic antigen [CEA]: Secondary | ICD-10-CM | POA: Diagnosis not present

## 2019-10-02 LAB — CBC WITH DIFFERENTIAL/PLATELET
Abs Immature Granulocytes: 0.01 10*3/uL (ref 0.00–0.07)
Basophils Absolute: 0 10*3/uL (ref 0.0–0.1)
Basophils Relative: 0 %
Eosinophils Absolute: 0.1 10*3/uL (ref 0.0–0.5)
Eosinophils Relative: 3 %
HCT: 51.2 % (ref 39.0–52.0)
Hemoglobin: 16.8 g/dL (ref 13.0–17.0)
Immature Granulocytes: 0 %
Lymphocytes Relative: 38 %
Lymphs Abs: 1.4 10*3/uL (ref 0.7–4.0)
MCH: 29.8 pg (ref 26.0–34.0)
MCHC: 32.8 g/dL (ref 30.0–36.0)
MCV: 90.8 fL (ref 80.0–100.0)
Monocytes Absolute: 0.4 10*3/uL (ref 0.1–1.0)
Monocytes Relative: 11 %
Neutro Abs: 1.8 10*3/uL (ref 1.7–7.7)
Neutrophils Relative %: 48 %
Platelets: 164 10*3/uL (ref 150–400)
RBC: 5.64 MIL/uL (ref 4.22–5.81)
RDW: 14 % (ref 11.5–15.5)
WBC: 3.7 10*3/uL — ABNORMAL LOW (ref 4.0–10.5)
nRBC: 0 % (ref 0.0–0.2)

## 2019-10-02 MED ORDER — SODIUM CHLORIDE 0.9% FLUSH
10.0000 mL | INTRAVENOUS | Status: DC | PRN
  Administered 2019-10-02: 10 mL via INTRAVENOUS
  Filled 2019-10-02: qty 10

## 2019-10-02 MED ORDER — HEPARIN SOD (PORK) LOCK FLUSH 100 UNIT/ML IV SOLN
500.0000 [IU] | Freq: Once | INTRAVENOUS | Status: AC
  Administered 2019-10-02: 500 [IU] via INTRAVENOUS
  Filled 2019-10-02: qty 5

## 2019-10-03 ENCOUNTER — Encounter: Payer: Self-pay | Admitting: Oncology

## 2019-10-03 LAB — CEA: CEA: 11.2 ng/mL — ABNORMAL HIGH (ref 0.0–4.7)

## 2019-10-03 NOTE — Progress Notes (Signed)
Patient denies any pain or concerns at this time.  

## 2019-10-04 ENCOUNTER — Inpatient Hospital Stay (HOSPITAL_BASED_OUTPATIENT_CLINIC_OR_DEPARTMENT_OTHER): Payer: Medicare Other | Admitting: Oncology

## 2019-10-04 ENCOUNTER — Encounter: Payer: Self-pay | Admitting: Oncology

## 2019-10-04 ENCOUNTER — Other Ambulatory Visit: Payer: Self-pay

## 2019-10-04 VITALS — BP 168/84 | HR 70 | Temp 97.4°F | Resp 20 | Ht 72.0 in | Wt 195.3 lb

## 2019-10-04 DIAGNOSIS — C187 Malignant neoplasm of sigmoid colon: Secondary | ICD-10-CM

## 2019-10-04 DIAGNOSIS — C19 Malignant neoplasm of rectosigmoid junction: Secondary | ICD-10-CM | POA: Diagnosis not present

## 2019-10-08 ENCOUNTER — Telehealth: Payer: Self-pay

## 2019-10-08 NOTE — Telephone Encounter (Signed)
Contacted patient for lung CT screening clinic based on referral from Dr. Grayland Ormond.  Message left for patient to call Burgess Estelle, lung navigator to schedule CT scan.

## 2019-10-31 ENCOUNTER — Telehealth: Payer: Self-pay

## 2019-10-31 NOTE — Telephone Encounter (Signed)
Contacted patient for lung CT screening clinic based on referral from Dr. Grayland Ormond.  Patient said he would like to participate in program but wants to wait until after Christmas. He said a lot was going on now and he is agreeable for our clinic to call him back at that time.  I thanked him for his time.

## 2019-11-18 ENCOUNTER — Other Ambulatory Visit: Payer: Self-pay

## 2019-11-18 ENCOUNTER — Inpatient Hospital Stay: Payer: Medicare Other | Attending: Hematology and Oncology

## 2019-11-18 DIAGNOSIS — Z9221 Personal history of antineoplastic chemotherapy: Secondary | ICD-10-CM | POA: Diagnosis not present

## 2019-11-18 DIAGNOSIS — Z452 Encounter for adjustment and management of vascular access device: Secondary | ICD-10-CM | POA: Insufficient documentation

## 2019-11-18 DIAGNOSIS — C19 Malignant neoplasm of rectosigmoid junction: Secondary | ICD-10-CM | POA: Diagnosis not present

## 2019-11-18 DIAGNOSIS — Z923 Personal history of irradiation: Secondary | ICD-10-CM | POA: Insufficient documentation

## 2019-11-18 DIAGNOSIS — Z95828 Presence of other vascular implants and grafts: Secondary | ICD-10-CM

## 2019-11-18 MED ORDER — HEPARIN SOD (PORK) LOCK FLUSH 100 UNIT/ML IV SOLN
500.0000 [IU] | Freq: Once | INTRAVENOUS | Status: AC
  Administered 2019-11-18: 500 [IU] via INTRAVENOUS
  Filled 2019-11-18: qty 5

## 2019-11-18 MED ORDER — SODIUM CHLORIDE 0.9% FLUSH
10.0000 mL | INTRAVENOUS | Status: DC | PRN
  Administered 2019-11-18: 10 mL via INTRAVENOUS
  Filled 2019-11-18: qty 10

## 2019-12-30 ENCOUNTER — Inpatient Hospital Stay: Payer: Medicare Other

## 2020-02-10 ENCOUNTER — Inpatient Hospital Stay: Payer: Medicare Other | Attending: Hematology and Oncology

## 2020-02-10 ENCOUNTER — Other Ambulatory Visit: Payer: Self-pay

## 2020-02-10 DIAGNOSIS — C187 Malignant neoplasm of sigmoid colon: Secondary | ICD-10-CM | POA: Insufficient documentation

## 2020-02-10 DIAGNOSIS — Z452 Encounter for adjustment and management of vascular access device: Secondary | ICD-10-CM | POA: Insufficient documentation

## 2020-02-10 DIAGNOSIS — Z95828 Presence of other vascular implants and grafts: Secondary | ICD-10-CM

## 2020-02-10 MED ORDER — SODIUM CHLORIDE 0.9% FLUSH
10.0000 mL | INTRAVENOUS | Status: DC | PRN
  Administered 2020-02-10: 10 mL via INTRAVENOUS
  Filled 2020-02-10: qty 10

## 2020-02-10 MED ORDER — HEPARIN SOD (PORK) LOCK FLUSH 100 UNIT/ML IV SOLN
500.0000 [IU] | Freq: Once | INTRAVENOUS | Status: AC
  Administered 2020-02-10: 500 [IU] via INTRAVENOUS
  Filled 2020-02-10: qty 5

## 2020-03-23 ENCOUNTER — Other Ambulatory Visit: Payer: Self-pay

## 2020-03-23 ENCOUNTER — Inpatient Hospital Stay: Payer: Medicare Other | Attending: Hematology and Oncology

## 2020-03-23 DIAGNOSIS — C187 Malignant neoplasm of sigmoid colon: Secondary | ICD-10-CM | POA: Insufficient documentation

## 2020-03-23 DIAGNOSIS — Z452 Encounter for adjustment and management of vascular access device: Secondary | ICD-10-CM | POA: Diagnosis present

## 2020-03-23 LAB — CBC WITH DIFFERENTIAL/PLATELET
Abs Immature Granulocytes: 0.01 10*3/uL (ref 0.00–0.07)
Basophils Absolute: 0 10*3/uL (ref 0.0–0.1)
Basophils Relative: 0 %
Eosinophils Absolute: 0.2 10*3/uL (ref 0.0–0.5)
Eosinophils Relative: 4 %
HCT: 49.9 % (ref 39.0–52.0)
Hemoglobin: 16.2 g/dL (ref 13.0–17.0)
Immature Granulocytes: 0 %
Lymphocytes Relative: 40 %
Lymphs Abs: 1.6 10*3/uL (ref 0.7–4.0)
MCH: 29.6 pg (ref 26.0–34.0)
MCHC: 32.5 g/dL (ref 30.0–36.0)
MCV: 91.1 fL (ref 80.0–100.0)
Monocytes Absolute: 0.5 10*3/uL (ref 0.1–1.0)
Monocytes Relative: 12 %
Neutro Abs: 1.7 10*3/uL (ref 1.7–7.7)
Neutrophils Relative %: 44 %
Platelets: 157 10*3/uL (ref 150–400)
RBC: 5.48 MIL/uL (ref 4.22–5.81)
RDW: 13.7 % (ref 11.5–15.5)
WBC: 4 10*3/uL (ref 4.0–10.5)
nRBC: 0 % (ref 0.0–0.2)

## 2020-03-23 MED ORDER — HEPARIN SOD (PORK) LOCK FLUSH 100 UNIT/ML IV SOLN
500.0000 [IU] | Freq: Once | INTRAVENOUS | Status: AC
  Administered 2020-03-23: 500 [IU] via INTRAVENOUS
  Filled 2020-03-23: qty 5

## 2020-03-24 LAB — CEA: CEA: 10.3 ng/mL — ABNORMAL HIGH (ref 0.0–4.7)

## 2020-05-04 ENCOUNTER — Other Ambulatory Visit: Payer: Self-pay

## 2020-05-04 ENCOUNTER — Inpatient Hospital Stay: Payer: Medicare Other | Attending: Hematology and Oncology

## 2020-05-04 DIAGNOSIS — C187 Malignant neoplasm of sigmoid colon: Secondary | ICD-10-CM | POA: Insufficient documentation

## 2020-05-04 DIAGNOSIS — Z452 Encounter for adjustment and management of vascular access device: Secondary | ICD-10-CM | POA: Insufficient documentation

## 2020-05-04 DIAGNOSIS — Z95828 Presence of other vascular implants and grafts: Secondary | ICD-10-CM

## 2020-05-04 MED ORDER — SODIUM CHLORIDE 0.9% FLUSH
10.0000 mL | Freq: Once | INTRAVENOUS | Status: AC
  Administered 2020-05-04: 10 mL via INTRAVENOUS
  Filled 2020-05-04: qty 10

## 2020-05-04 MED ORDER — HEPARIN SOD (PORK) LOCK FLUSH 100 UNIT/ML IV SOLN
500.0000 [IU] | Freq: Once | INTRAVENOUS | Status: AC
  Administered 2020-05-04: 500 [IU] via INTRAVENOUS
  Filled 2020-05-04: qty 5

## 2020-06-15 ENCOUNTER — Inpatient Hospital Stay: Payer: Medicare Other

## 2020-06-22 ENCOUNTER — Inpatient Hospital Stay: Payer: Medicare Other

## 2020-06-23 ENCOUNTER — Inpatient Hospital Stay: Payer: Medicare Other

## 2020-07-27 ENCOUNTER — Inpatient Hospital Stay: Payer: Medicare Other

## 2020-07-28 ENCOUNTER — Inpatient Hospital Stay: Payer: Medicare Other | Attending: Hematology and Oncology

## 2020-07-28 ENCOUNTER — Other Ambulatory Visit: Payer: Self-pay

## 2020-07-28 DIAGNOSIS — Z452 Encounter for adjustment and management of vascular access device: Secondary | ICD-10-CM | POA: Diagnosis not present

## 2020-07-28 DIAGNOSIS — C187 Malignant neoplasm of sigmoid colon: Secondary | ICD-10-CM | POA: Insufficient documentation

## 2020-07-28 DIAGNOSIS — Z95828 Presence of other vascular implants and grafts: Secondary | ICD-10-CM

## 2020-07-28 MED ORDER — SODIUM CHLORIDE 0.9% FLUSH
10.0000 mL | Freq: Once | INTRAVENOUS | Status: AC
  Administered 2020-07-28: 10 mL via INTRAVENOUS
  Filled 2020-07-28: qty 10

## 2020-07-28 MED ORDER — HEPARIN SOD (PORK) LOCK FLUSH 100 UNIT/ML IV SOLN
500.0000 [IU] | Freq: Once | INTRAVENOUS | Status: AC
Start: 2020-07-28 — End: 2020-07-28
  Administered 2020-07-28: 500 [IU] via INTRAVENOUS
  Filled 2020-07-28: qty 5

## 2020-09-07 ENCOUNTER — Inpatient Hospital Stay: Payer: Medicare Other

## 2020-09-08 ENCOUNTER — Inpatient Hospital Stay: Payer: Medicare Other

## 2020-10-01 ENCOUNTER — Inpatient Hospital Stay: Payer: Medicare Other | Attending: Oncology

## 2020-10-01 DIAGNOSIS — Z923 Personal history of irradiation: Secondary | ICD-10-CM | POA: Insufficient documentation

## 2020-10-01 DIAGNOSIS — F17210 Nicotine dependence, cigarettes, uncomplicated: Secondary | ICD-10-CM | POA: Insufficient documentation

## 2020-10-01 DIAGNOSIS — Z9221 Personal history of antineoplastic chemotherapy: Secondary | ICD-10-CM | POA: Insufficient documentation

## 2020-10-01 DIAGNOSIS — Z85048 Personal history of other malignant neoplasm of rectum, rectosigmoid junction, and anus: Secondary | ICD-10-CM | POA: Diagnosis present

## 2020-10-01 DIAGNOSIS — E119 Type 2 diabetes mellitus without complications: Secondary | ICD-10-CM | POA: Diagnosis not present

## 2020-10-01 DIAGNOSIS — R970 Elevated carcinoembryonic antigen [CEA]: Secondary | ICD-10-CM | POA: Diagnosis not present

## 2020-10-01 DIAGNOSIS — I10 Essential (primary) hypertension: Secondary | ICD-10-CM | POA: Diagnosis not present

## 2020-10-01 DIAGNOSIS — Z79899 Other long term (current) drug therapy: Secondary | ICD-10-CM | POA: Insufficient documentation

## 2020-10-01 DIAGNOSIS — Z7982 Long term (current) use of aspirin: Secondary | ICD-10-CM | POA: Insufficient documentation

## 2020-10-01 DIAGNOSIS — C187 Malignant neoplasm of sigmoid colon: Secondary | ICD-10-CM

## 2020-10-01 LAB — CBC WITH DIFFERENTIAL/PLATELET
Abs Immature Granulocytes: 0.01 10*3/uL (ref 0.00–0.07)
Basophils Absolute: 0 10*3/uL (ref 0.0–0.1)
Basophils Relative: 0 %
Eosinophils Absolute: 0.1 10*3/uL (ref 0.0–0.5)
Eosinophils Relative: 3 %
HCT: 46.9 % (ref 39.0–52.0)
Hemoglobin: 15.8 g/dL (ref 13.0–17.0)
Immature Granulocytes: 0 %
Lymphocytes Relative: 39 %
Lymphs Abs: 1.3 10*3/uL (ref 0.7–4.0)
MCH: 30.7 pg (ref 26.0–34.0)
MCHC: 33.7 g/dL (ref 30.0–36.0)
MCV: 91.2 fL (ref 80.0–100.0)
Monocytes Absolute: 0.4 10*3/uL (ref 0.1–1.0)
Monocytes Relative: 11 %
Neutro Abs: 1.5 10*3/uL — ABNORMAL LOW (ref 1.7–7.7)
Neutrophils Relative %: 47 %
Platelets: 162 10*3/uL (ref 150–400)
RBC: 5.14 MIL/uL (ref 4.22–5.81)
RDW: 14.2 % (ref 11.5–15.5)
WBC: 3.3 10*3/uL — ABNORMAL LOW (ref 4.0–10.5)
nRBC: 0 % (ref 0.0–0.2)

## 2020-10-01 MED ORDER — SODIUM CHLORIDE 0.9% FLUSH
10.0000 mL | Freq: Once | INTRAVENOUS | Status: AC
Start: 2020-10-01 — End: 2020-10-01
  Administered 2020-10-01: 10 mL via INTRAVENOUS
  Filled 2020-10-01: qty 10

## 2020-10-01 MED ORDER — HEPARIN SOD (PORK) LOCK FLUSH 100 UNIT/ML IV SOLN
500.0000 [IU] | Freq: Once | INTRAVENOUS | Status: AC
  Administered 2020-10-01: 500 [IU] via INTRAVENOUS
  Filled 2020-10-01: qty 5

## 2020-10-02 LAB — CEA: CEA: 8.7 ng/mL — ABNORMAL HIGH (ref 0.0–4.7)

## 2020-10-03 NOTE — Progress Notes (Signed)
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  INTERVAL HISTORY: Patient returns to clinic today for repeat laboratory work and routine yearly evaluation.  He continues to feel well and remains asymptomatic. He has no neurologic complaints. He denies any recent fevers or illnesses. He has a good appetite and denies weight loss.  He denies any chest pain, shortness of breath, cough, or hemoptysis.  He denies any nausea, vomiting, constipation, or diarrhea. He has noted no changes in his bowel movements and denies any melena or hematochezia. He has no urinary complaints.  Patient feels at his baseline offers no specific complaints today.  REVIEW OF SYSTEMS:   Review of Systems  Constitutional: Negative.  Negative for fever, malaise/fatigue and weight loss.  Respiratory: Negative.  Negative for cough, hemoptysis and shortness of breath.   Cardiovascular: Negative.  Negative for chest pain and leg swelling.  Gastrointestinal: Negative.  Negative for abdominal pain, blood in stool, constipation, diarrhea, melena, nausea and vomiting.  Genitourinary: Negative.  Negative for dysuria.  Musculoskeletal: Negative.  Negative for back pain.  Skin: Negative.  Negative for rash.  Neurological: Negative.  Negative for focal weakness, weakness and headaches.  Psychiatric/Behavioral: Negative.  The patient is not nervous/anxious.    As per HPI. Otherwise, a complete review of systems is negative.  ONCOLOGY HISTORY: Oncology History   No history exists.    PAST MEDICAL HISTORY: Past Medical History:  Diagnosis Date   Arthritis    SHOULDERS AND  JOINTS   Cancer [facility])    Colon cancer 11/2010 with parital resection with chemo and rad tx   COPD (chronic obstructive pulmonary disease) ([place])    Dental bridge present    REMOVABLE   Diabetes mellitus without complication ([place])    TYPE 2, DIET CONTROLLED   Elevated PSA    Hypertension    Neuromuscular disorder (HCC)    WEAKNESS AND NUMBNESS HANDS AND FEET, POSSIBLY FROM CHEMO   Tobacco abuse     PAST SURGICAL HISTORY: Past Surgical History:  Procedure Laterality Date   CERVICAL DISCECTOMY     CERVICAL FUSION     COLON SURGERY  NOV 2012   COLON REMOVED   COLON SURGERY  APRIL 2014   OSTOMY REMOVED   COLONOSCOPY N/A 04/21/2015   Procedure: COLONOSCOPY;  Surgeon: Hulen Luster, MD;  Location: [place];  Service: Gastroenterology;  Laterality: N/A;  DIABETIC, DIET CONTROLLED   COLONOSCOPY WITH PROPOFOL N/A 11/07/2018   Procedure: COLONOSCOPY WITH PROPOFOL;  Surgeon: Robert Bellow, MD;  Location: [facility];  Service: Endoscopy;  Laterality: N/A;   COLOSTOMY REVISION     hx of colostomy     LAMINECTOMY     POLYPECTOMY N/A 04/21/2015   Procedure: POLYPECTOMY;  Surgeon: Hulen Luster, MD;  Location: [place];  Service: Gastroenterology;  Laterality: N/A;   TUNNELED VENOUS PORT PLACEMENT      FAMILY HISTORY: Reviewed and unchanged. No reported history of malignancy or chronic disease.   ADVANCED DIRECTIVES:    HEALTH MAINTENANCE: Social History   Tobacco Use   Smoking status: Every Day    Packs/day: 0.50    Years: 30.00    Pack years:  15.00    Types: Cigarettes   Smokeless tobacco: Never  Substance Use Topics   Alcohol use: No    Alcohol/week: 0.0 standard drinks   Drug use: No    No Known Allergies  Current Outpatient Medications  Medication Sig Dispense Refill   acetaminophen (TYLENOL) 650 MG CR tablet Take 650 mg by mouth every 8 (eight) hours as needed for pain.     amLODipine (NORVASC) 10 MG tablet Take 10 mg by mouth daily.     aspirin  81 MG tablet Take 81 mg by mouth daily. 800AM     hydrochlorothiazide (HYDRODIURIL) 12.5 MG tablet Take 12.5 mg by mouth daily.     Ibuprofen 200 MG CAPS Take by mouth every 6 (six) hours as needed.     losartan (COZAAR) 100 MG tablet Take 100 mg by mouth daily.     No current facility-administered medications for this visit.   Facility-Administered Medications Ordered in Other Visits  Medication Dose Route Frequency Provider Last Rate Last Admin   sodium chloride flush (NS) 0.9 % injection 10 mL  10 mL Intravenous PRN Lloyd Huger, MD   10 mL at 02/16/18 0949    OBJECTIVE: BP 136/77   Pulse 65   Temp (!) 97.3 F (36.3 C)   Resp 16   Wt 190 lb 4.8 oz (86.3 kg)   BMI 25.81 kg/m    Body mass index is 25.81 kg/m.    ECOG FS:0 - Asymptomatic   General: Well-developed, well-nourished, no acute distress. Eyes: Pink conjunctiva, anicteric sclera. HEENT: Normocephalic, moist mucous membranes. Lungs: No audible wheezing or coughing. Heart: Regular rate and rhythm. Abdomen: Soft, nontender, no obvious distention. Musculoskeletal: No edema, cyanosis, or clubbing. Neuro: Alert, answering all questions appropriately. Cranial nerves grossly intact. Skin: No rashes or petechiae noted. Psych: Normal affect.   LAB RESULTS:  No visits with results within 3 Day(s) from this visit.  Latest known visit with results is:  Infusion on 10/01/2020  Component Date Value Ref Range Status   CEA 10/01/2020 8.7 (A) 0.0 - 4.7 ng/mL Final   Comment: (NOTE)                             Nonsmokers          <3.9                             Smokers             <5.6 [employer] Electrochemiluminescence Immunoassay (ECLIA) Values obtained with different assay methods or kits cannot be used interchangeably.  Results cannot be interpreted as absolute evidence of the presence or absence of malignant disease. Performed At: [facility] [place], [place] [id number] Rush Farmer MD [id number]    WBC 10/01/2020 3.3 (A) 4.0 - 10.5 K/uL Final   RBC 10/01/2020 5.14  4.22 - 5.81 MIL/uL Final   Hemoglobin 10/01/2020 15.8  13.0 - 17.0 g/dL Final   HCT 10/01/2020 46.9  39.0 - 52.0 % Final   MCV 10/01/2020 91.2  80.0 - 100.0 fL Final   MCH 10/01/2020 30.7  26.0 - 34.0 pg Final   MCHC 10/01/2020 33.7  30.0 - 36.0 g/dL Final   RDW 10/01/2020 14.2  11.5 - 15.5 % Final   Platelets 10/01/2020 162  150 - 400 K/uL Final   nRBC 10/01/2020 0.0  0.0 -  0.2 % Final   Neutrophils Relative % 10/01/2020 47  % Final   Neutro Abs 10/01/2020 1.5 (A) 1.7 - 7.7 K/uL Final   Lymphocytes Relative 10/01/2020 39  % Final   Lymphs Abs 10/01/2020 1.3  0.7 - 4.0 K/uL Final   Monocytes Relative 10/01/2020 11  % Final   Monocytes Absolute 10/01/2020 0.4  0.1 - 1.0 K/uL Final   Eosinophils Relative 10/01/2020 3  % Final   Eosinophils Absolute 10/01/2020 0.1  0.0 - 0.5 K/uL Final   Basophils Relative 10/01/2020 0  % Final   Basophils Absolute 10/01/2020 0.0  0.0 - 0.1 K/uL Final   Immature Granulocytes 10/01/2020 0  % Final   Abs Immature Granulocytes 10/01/2020 0.01  0.00 - 0.07 K/uL Final   Performed at [facility], [address], [place], [place] [address]    STUDIES: No results found.  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   PLAN:    1. 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 He subsequently underwent partial colectomy and adjuvant chemotherapy which was completed on July 15, 2011. He then completed 5-FU and XRT in October 2013.  Patient had a repeat colonoscopy on November 07, 2018 that removed 2 benign polyps, but otherwise was reported within normal limits.  Recommendation was repeat in 3 years.  CT scan results from September 25, 2018 reviewed independently with no obvious evidence of recurrent or metastatic disease.  Despite no evidence of malignancy,  patient's CEA has been persistently elevated since at least August 2019 when it was reported at 7.9.  Today's result is 8.7.  Suspect this is secondary to his ongoing tobacco use.  No intervention is needed at this time.  Patient is nearly 10 years removed from completing his treatment with no evidence of disease, therefore can be discharged from clinic.  Please refer patient back if there are any questions or concerns. 2.  Chronic tobacco use: Likely causing his elevated CEA.  Patient expressed no desire and discontinuing.  Previously, he was given a referral to the lung cancer screening clinic. 3.  Port: Patient was given a referral to surgery to have port removed.  Patient expressed understanding and was in agreement with this plan. He also understands that He can call clinic at any time with any questions, concerns, or complaints.    Lloyd Huger, MD   10/06/2020 11:49 AM

## 2020-10-06 ENCOUNTER — Encounter: Payer: Self-pay | Admitting: Oncology

## 2020-10-06 ENCOUNTER — Other Ambulatory Visit: Payer: Self-pay

## 2020-10-06 ENCOUNTER — Inpatient Hospital Stay (HOSPITAL_BASED_OUTPATIENT_CLINIC_OR_DEPARTMENT_OTHER): Payer: Medicare Other | Admitting: Oncology

## 2020-10-06 VITALS — BP 136/77 | HR 65 | Temp 97.3°F | Resp 16 | Wt 190.3 lb

## 2020-10-06 DIAGNOSIS — Z85048 Personal history of other malignant neoplasm of rectum, rectosigmoid junction, and anus: Secondary | ICD-10-CM | POA: Diagnosis not present

## 2020-10-06 DIAGNOSIS — C187 Malignant neoplasm of sigmoid colon: Secondary | ICD-10-CM | POA: Diagnosis not present

## 2020-10-06 NOTE — Progress Notes (Signed)
Patient denies new problems/concerns today.   °

## 2020-10-15 ENCOUNTER — Ambulatory Visit: Payer: Medicare Other | Admitting: Surgery

## 2020-10-22 ENCOUNTER — Encounter: Payer: Self-pay | Admitting: Surgery

## 2020-10-22 ENCOUNTER — Other Ambulatory Visit: Payer: Self-pay

## 2020-10-22 ENCOUNTER — Ambulatory Visit: Payer: Medicare Other | Admitting: Surgery

## 2020-10-22 VITALS — BP 187/73 | HR 76 | Temp 98.5°F | Ht 72.0 in | Wt 187.8 lb

## 2020-10-22 DIAGNOSIS — Z452 Encounter for adjustment and management of vascular access device: Secondary | ICD-10-CM

## 2020-10-22 DIAGNOSIS — Z95828 Presence of other vascular implants and grafts: Secondary | ICD-10-CM

## 2020-10-22 NOTE — Progress Notes (Signed)
SURGICAL OPERATIVE REPORT  DATE OF PROCEDURE: 10/22/2020  ANESTHESIA: Local  PRE-OPERATIVE DIAGNOSIS: Tunneled right internal jugular central venous catheter with subcutaneous port, no longer needed (icd-10: R56.153)  POST-OPERATIVE DIAGNOSIS:  Same  PROCEDURE(S):  1.) Removal of indwelling right internal jugular central venous catheter with subcutaneous port (cpt: 36590)  INTRAOPERATIVE FINDINGS: Well-incorporated tunneled right internal jugular central venous catheter with subcutaneous port without erythema or purulence  INTRAVENOUS FLUIDS: 0 mL crystalloid   ESTIMATED BLOOD LOSS: Minimal (< 20 mL)  SPECIMEN/EXPLANT: Port-A-Cath with attached catheter, removed intact.  DRAINS: none  COMPLICATIONS: None apparent  CONDITION AT END OF PROCEDURE: Hemodynamically stable and awake  DISPOSITION OF PATIENT: Home.  INDICATIONS FOR PROCEDURE:  Patient is a  s/p complete utilization of a right internal jugular central venous catheter with indwelling subcutaneous port presents for evaluation and removal of his no longer needed port. Patient reports the port was never infected and always functioned properly, but since it's reportedly no longer needed, and  wishes to have it removed. All risks, benefits, and alternatives to above procedure were discussed with the patient, all of patient's questions were answered to his expressed satisfaction, and informed consent was obtained and documented.  DETAILS OF PROCEDURE: Patient was brought to the procedure suite and appropriately identified. In supine position, operative site was prepped and draped in the usual sterile fashion, and following a brief time out, lidocaine was injected in the subcutaneous tissue overlying the port, a 2 cm incision was made through the previous scar to accommodate the port. A combination of blunt dissection, sharp dissection  were used to free the port and attached catheter from its surrounding capsule and tissues. The  port was then able to be removed from its subcutaneous pocket, the catheter was confirmed to slide freely, and the catheter was removed with immediate focal pressure applied to the venous insertion site for hemostasis. Hemostasis was then confirmed, the subcutaneous pocket was evaluated to ensure all/any previous used sutures were removed, and the wound was then closed in layers, using 3-0 Vicryl buried interrupted suture to re-approximate dermis. Additional local anesthetic was injected subcutaneously along the length of the incision, and a running subcuticular 4-0 Monocryl suture was used to re-approximate epidermis. Skin was then cleaned, dried, and sterile Dermabond was applied and allowed to dry.  Ronny Bacon, M.D., [facility]  10/22/2020 ; 11:23 AM

## 2020-10-22 NOTE — Patient Instructions (Signed)
We have removed your port today. You have sutures under your skin that will dissolve in about 2 months. You have glue over the top of the wound. Do not shower for 24 hours. Do not scrub at the area. Do not put anything on the area. The glue will start to come off in 1-2 weeks.  You may use ice to the area several times today and tomorrow. You may take Ibuprofen or Tylenol for any discomfort.  Follow-up with our office as needed.  Please call and ask to speak with a nurse if you develop questions or concerns.

## 2021-07-28 IMAGING — CT CT ABD-PELV W/ CM
1 of 3 series · 11 of 32 positions shown, 17 images · IV contrast (omnipaque)
Comparison: 02/23/2018, 01/27/2015, 04/23/2014

CLINICAL DATA: Restaging rectosigmoid colon cancer, increasing CEA,
resection with chemo and radiation therapy 1141

EXAM:
CT CHEST, ABDOMEN, AND PELVIS WITH CONTRAST
TECHNIQUE: Multidetector CT imaging of the chest, abdomen and pelvis was
performed following the standard protocol during bolus
administration of intravenous contrast.
CONTRAST:  100mL OMNIPAQUE IOHEXOL 300 MG/ML SOLN, additional oral
enteric contrast

[Series 2: cap with · axial · 0.86mm/px · z∈[-977,-432]mm · 11 of 131 slices shown, 17 images]
[im 11/131  soft-tissue]
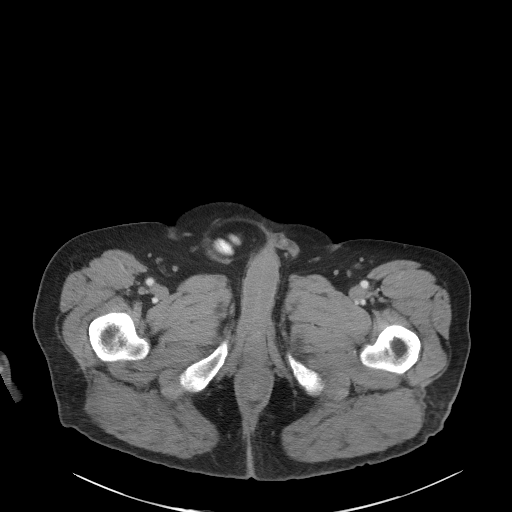
[im 11/131  bone]
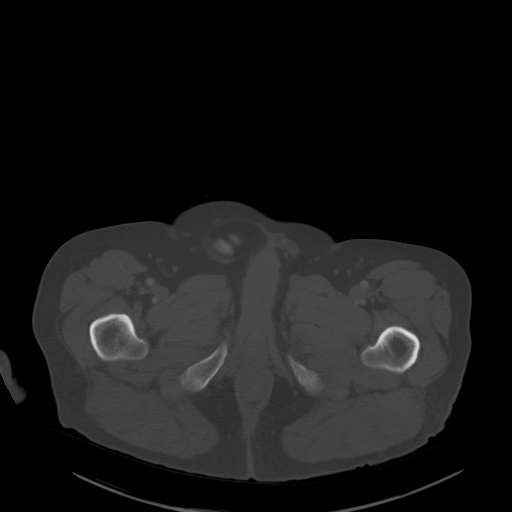
[im 22/131  soft-tissue]
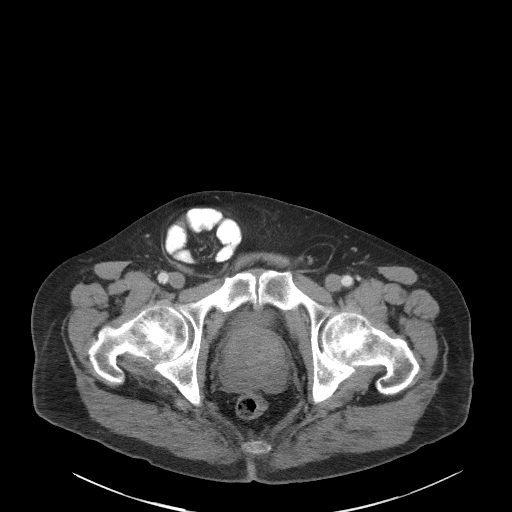
[im 33/131  soft-tissue]
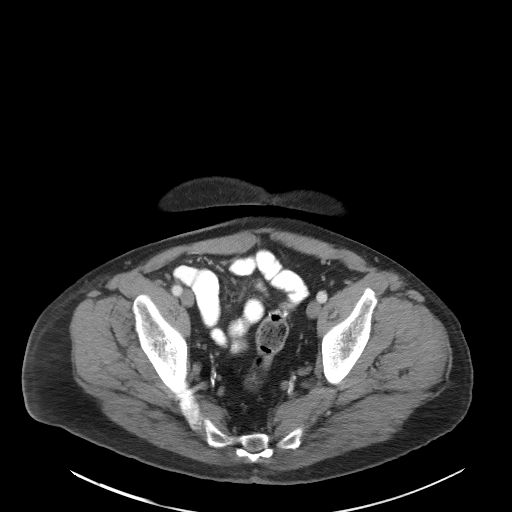
[im 44/131  soft-tissue]
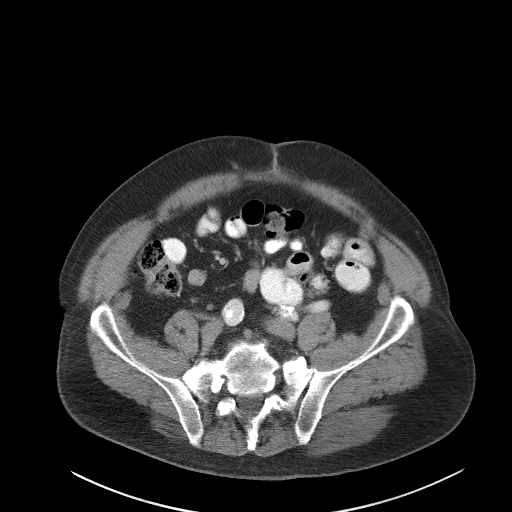
[im 55/131  soft-tissue]
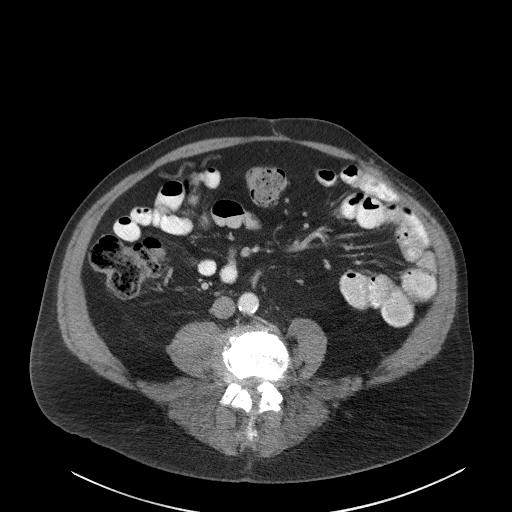
[im 66/131  soft-tissue]
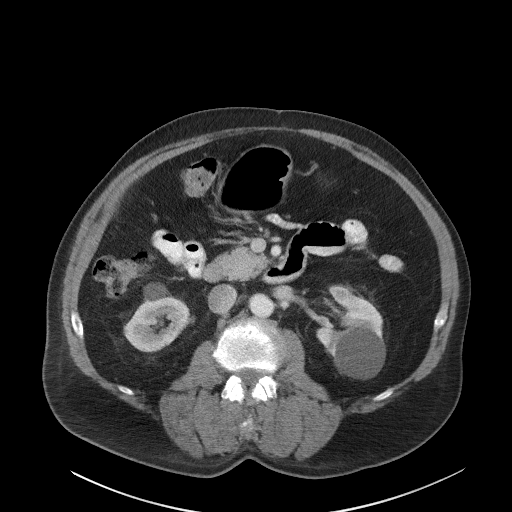
[im 76/131  soft-tissue]
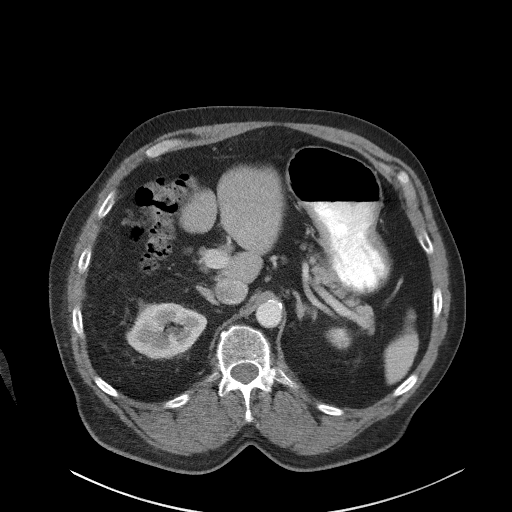
[im 87/131  soft-tissue]
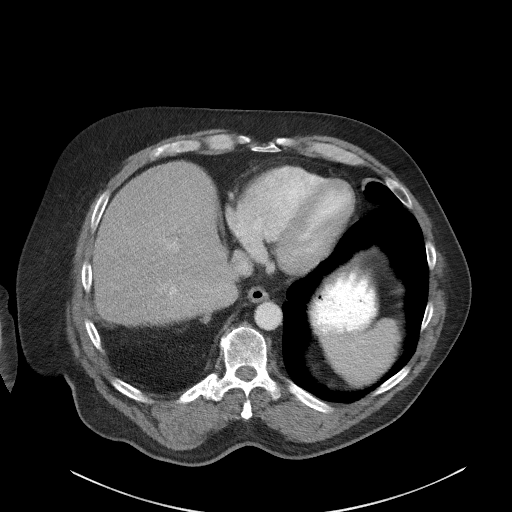
[im 87/131  lung]
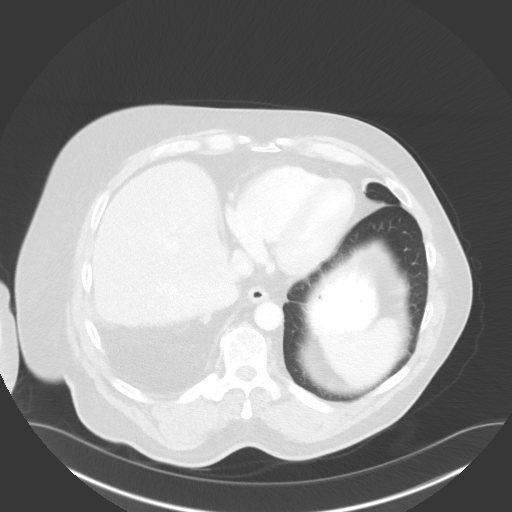
[im 98/131  soft-tissue]
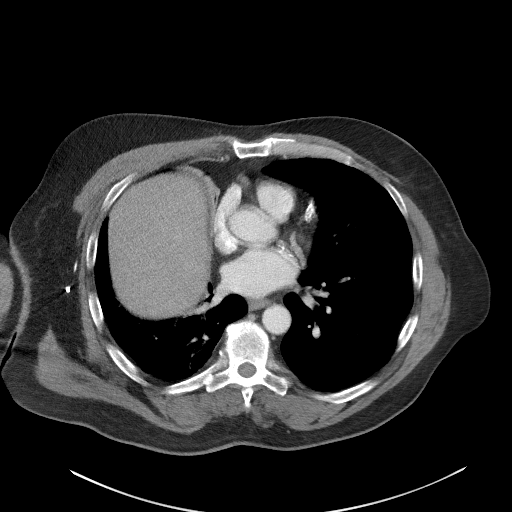
[im 98/131  lung]
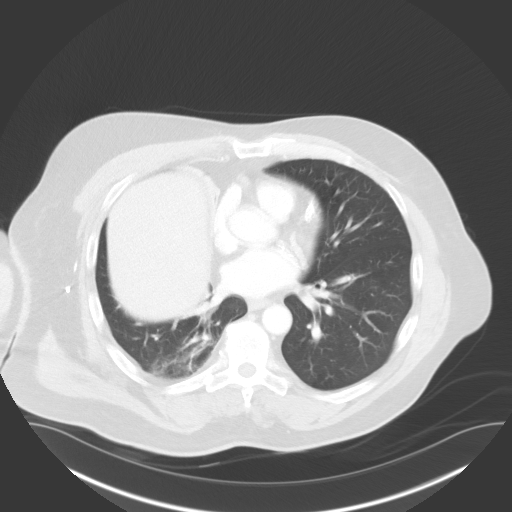
[im 98/131  bone]
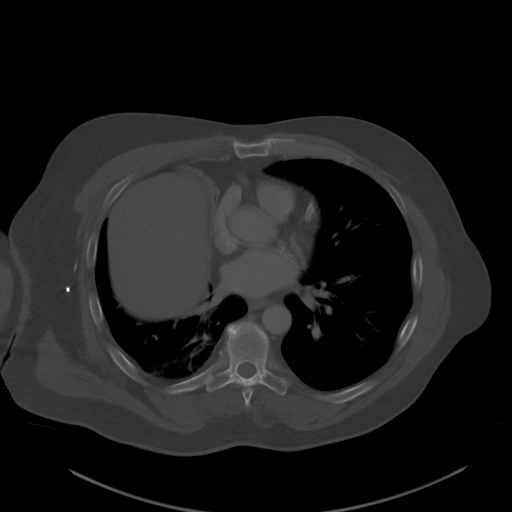
[im 109/131  soft-tissue]
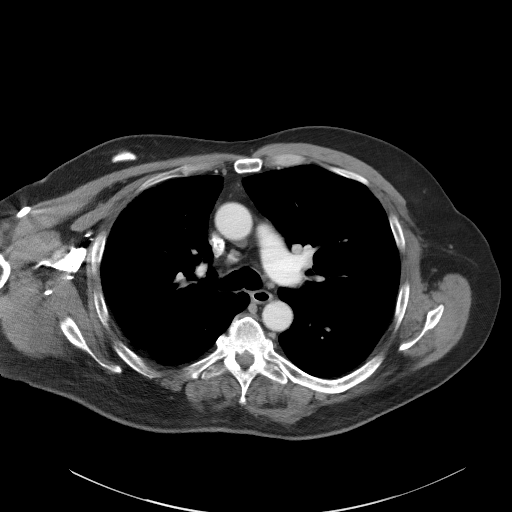
[im 109/131  lung]
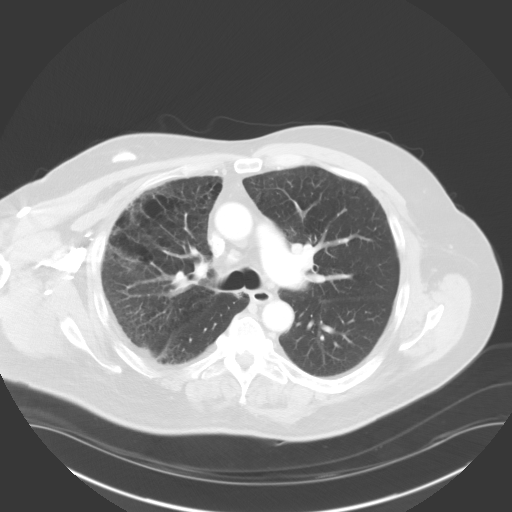
[im 120/131  soft-tissue]
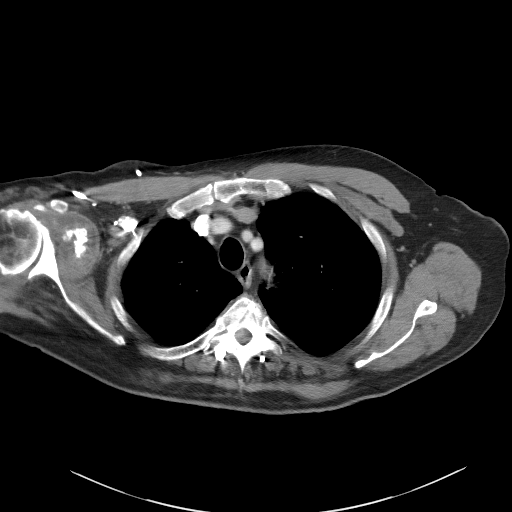
[im 120/131  lung]
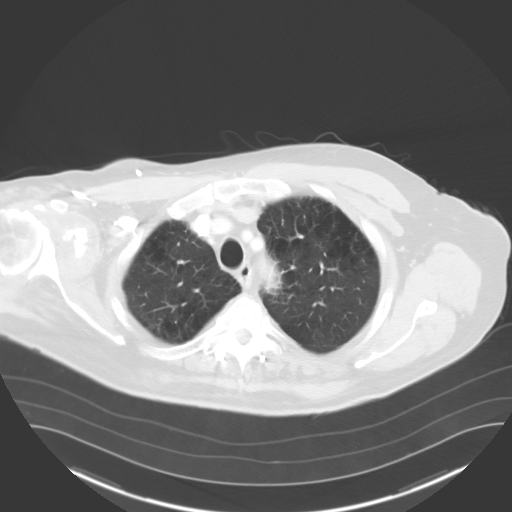

[11 of 32 positions shown; findings below may reference images not displayed]

FINDINGS: CT CHEST FINDINGS

Cardiovascular: Aortic atherosclerosis. Normal heart size.
Three-vessel coronary artery calcifications. No pericardial
effusion. Right chest port catheter.

Mediastinum/Nodes: No enlarged mediastinal, hilar, or axillary lymph
nodes. Thyroid gland, trachea, and esophagus demonstrate no
significant findings.

Lungs/Pleura: Severe centrilobular and paraseptal emphysema with
redemonstrated bandlike scarring and volume loss of the right lung
base along with severe elevation of the right hemidiaphragm. Stable,
benign 8 mm pulmonary nodule of the left lower lobe (series 4, image
63). No pleural effusion or pneumothorax.

Musculoskeletal: No chest wall mass or suspicious bone lesions
identified. Severe right glenohumeral arthrosis with large pannus or
calcified loose bodies within the subscapular joint recess.

CT ABDOMEN PELVIS FINDINGS

Hepatobiliary: No solid liver abnormality is seen. No gallstones,
gallbladder wall thickening, or biliary dilatation.

Pancreas: Unremarkable. No pancreatic ductal dilatation or
surrounding inflammatory changes.

Spleen: Normal in size without significant abnormality.

Adrenals/Urinary Tract: Adrenal glands are unremarkable. Multiple
bilateral exophytic renal cysts. Kidneys are otherwise normal,
without renal calculi, solid lesion, or hydronephrosis. Bladder is
unremarkable.

Stomach/Bowel: Stomach is within normal limits. Appendix is
surgically absent status post sigmorectal resection and
reanastomosis.

Vascular/Lymphatic: Aortic atherosclerosis. Unchanged 1.5 cm focal
ectasia of the distal right common iliac artery. No enlarged
abdominal or pelvic lymph nodes.

Reproductive: No mass or other abnormality.

Other: There is a large right inguinal hernia containing multiple
loops of nonobstructed distal small bowel. No abdominopelvic
ascites.

Musculoskeletal: No acute or significant osseous findings.
IMPRESSION: 1. Postoperative findings of sigmorectal resection without evidence
of recurrent mass, lymphadenopathy, or distant metastatic disease.

2.  Stable, benign 8 mm pulmonary nodule of the left lower lobe.

3. Large right inguinal hernia containing multiple loops of
nonobstructed distal small bowel.

4. Coronary artery disease. Aortic Atherosclerosis (LK13S-DBV.V) and
Emphysema (LK13S-Y7T.G).

## 2022-09-08 ENCOUNTER — Other Ambulatory Visit: Payer: Self-pay | Admitting: Student

## 2022-09-08 DIAGNOSIS — M5416 Radiculopathy, lumbar region: Secondary | ICD-10-CM

## 2022-09-08 DIAGNOSIS — M5441 Lumbago with sciatica, right side: Secondary | ICD-10-CM

## 2022-09-16 ENCOUNTER — Ambulatory Visit
Admission: RE | Admit: 2022-09-16 | Discharge: 2022-09-16 | Disposition: A | Discharge status: Home / Self Care | Payer: Medicare Other | Source: Ambulatory Visit | Attending: Student | Admitting: Student

## 2022-09-16 DIAGNOSIS — M5441 Lumbago with sciatica, right side: Secondary | ICD-10-CM | POA: Insufficient documentation

## 2022-09-16 DIAGNOSIS — M5416 Radiculopathy, lumbar region: Secondary | ICD-10-CM | POA: Insufficient documentation

## 2022-10-12 DIAGNOSIS — D649 Anemia, unspecified: Secondary | ICD-10-CM | POA: Insufficient documentation

## 2023-03-10 ENCOUNTER — Other Ambulatory Visit: Payer: Self-pay | Admitting: Internal Medicine

## 2023-03-10 DIAGNOSIS — R2689 Other abnormalities of gait and mobility: Secondary | ICD-10-CM

## 2023-03-10 DIAGNOSIS — E119 Type 2 diabetes mellitus without complications: Secondary | ICD-10-CM

## 2023-03-10 DIAGNOSIS — R918 Other nonspecific abnormal finding of lung field: Secondary | ICD-10-CM

## 2023-03-15 ENCOUNTER — Emergency Department

## 2023-03-15 ENCOUNTER — Encounter: Payer: Self-pay | Admitting: Emergency Medicine

## 2023-03-15 ENCOUNTER — Other Ambulatory Visit: Payer: Self-pay

## 2023-03-15 DIAGNOSIS — Z7982 Long term (current) use of aspirin: Secondary | ICD-10-CM | POA: Insufficient documentation

## 2023-03-15 DIAGNOSIS — E119 Type 2 diabetes mellitus without complications: Secondary | ICD-10-CM | POA: Diagnosis not present

## 2023-03-15 DIAGNOSIS — I10 Essential (primary) hypertension: Secondary | ICD-10-CM | POA: Insufficient documentation

## 2023-03-15 DIAGNOSIS — Z79899 Other long term (current) drug therapy: Secondary | ICD-10-CM | POA: Diagnosis not present

## 2023-03-15 DIAGNOSIS — W19XXXA Unspecified fall, initial encounter: Secondary | ICD-10-CM | POA: Diagnosis not present

## 2023-03-15 DIAGNOSIS — Z85038 Personal history of other malignant neoplasm of large intestine: Secondary | ICD-10-CM | POA: Insufficient documentation

## 2023-03-15 DIAGNOSIS — J449 Chronic obstructive pulmonary disease, unspecified: Secondary | ICD-10-CM | POA: Diagnosis not present

## 2023-03-15 DIAGNOSIS — S8261XA Displaced fracture of lateral malleolus of right fibula, initial encounter for closed fracture: Secondary | ICD-10-CM | POA: Insufficient documentation

## 2023-03-15 DIAGNOSIS — S99911A Unspecified injury of right ankle, initial encounter: Secondary | ICD-10-CM | POA: Diagnosis present

## 2023-03-15 NOTE — ED Triage Notes (Addendum)
 EMS brings pt in from home for c/o fall PTA due to his "sciatica"; c/o pain to rt ankle and thigh; denies hitting head, denies any other c/o or injuries

## 2023-03-16 ENCOUNTER — Emergency Department

## 2023-03-16 ENCOUNTER — Other Ambulatory Visit: Payer: Self-pay | Admitting: Internal Medicine

## 2023-03-16 ENCOUNTER — Emergency Department
Admission: EM | Admit: 2023-03-16 | Discharge: 2023-03-16 | Disposition: A | Discharge status: Home / Self Care | Attending: Emergency Medicine | Admitting: Emergency Medicine

## 2023-03-16 DIAGNOSIS — S8261XA Displaced fracture of lateral malleolus of right fibula, initial encounter for closed fracture: Secondary | ICD-10-CM

## 2023-03-16 DIAGNOSIS — G8929 Other chronic pain: Secondary | ICD-10-CM

## 2023-03-16 DIAGNOSIS — W19XXXA Unspecified fall, initial encounter: Secondary | ICD-10-CM

## 2023-03-16 LAB — CBC
HCT: 43.2 % (ref 39.0–52.0)
Hemoglobin: 13.4 g/dL (ref 13.0–17.0)
MCH: 26.9 pg (ref 26.0–34.0)
MCHC: 31 g/dL (ref 30.0–36.0)
MCV: 86.7 fL (ref 80.0–100.0)
Platelets: 245 10*3/uL (ref 150–400)
RBC: 4.98 MIL/uL (ref 4.22–5.81)
RDW: 16.8 % — ABNORMAL HIGH (ref 11.5–15.5)
WBC: 6.2 10*3/uL (ref 4.0–10.5)
nRBC: 0 % (ref 0.0–0.2)

## 2023-03-16 LAB — BASIC METABOLIC PANEL
Anion gap: 8 (ref 5–15)
BUN: 16 mg/dL (ref 8–23)
CO2: 24 mmol/L (ref 22–32)
Calcium: 8.6 mg/dL — ABNORMAL LOW (ref 8.9–10.3)
Chloride: 107 mmol/L (ref 98–111)
Creatinine, Ser: 0.91 mg/dL (ref 0.61–1.24)
GFR, Estimated: >60 mL/min (ref >60)
Glucose, Bld: 109 mg/dL — ABNORMAL HIGH (ref 70–99)
Potassium: 3.4 mmol/L — ABNORMAL LOW (ref 3.5–5.1)
Sodium: 139 mmol/L (ref 135–145)

## 2023-03-16 LAB — TROPONIN I (HIGH SENSITIVITY): Troponin I (High Sensitivity): 21 ng/L — ABNORMAL HIGH (ref <18)

## 2023-03-16 MED ORDER — LIDOCAINE 5 % EX PTCH: 1.0000 | MEDICATED_PATCH | CUTANEOUS | 0 refills | Status: DC

## 2023-03-16 MED ORDER — LIDOCAINE 5 % EX PTCH
1.0000 | MEDICATED_PATCH | CUTANEOUS | Status: DC
  Administered 2023-03-16: 1 via TRANSDERMAL
  Filled 2023-03-16: qty 1

## 2023-03-16 MED ORDER — KETOROLAC TROMETHAMINE 60 MG/2ML IM SOLN
30.0000 mg | Freq: Once | INTRAMUSCULAR | Status: AC
  Administered 2023-03-16: 30 mg via INTRAMUSCULAR
  Filled 2023-03-16: qty 2

## 2023-03-16 NOTE — Discharge Instructions (Addendum)
 Take your Tramadol as needed for pain.  Apply Lidocaine patch as directed for pain.  Keep splint clean and dry.  Elevate affected area and apply ice over splint several times daily.  Use your walker to help you balance as you walk.  Return to the ER for worsening symptoms or other concerns.

## 2023-03-16 NOTE — ED Provider Notes (Signed)
 [facility] Provider Note    Event Date/Time   First MD Initiated Contact with Patient 03/16/23 [phone]     (approximate)   History   Fall   HPI  Cameron Howe is a 78 y.o. male brought to the ED via EMS from home with a chief complaint of mechanical fall.  Patient reports chronic sciatica which caused him to fall tonight, injuring his right ankle and thigh.  Denies striking head or LOC.  Denies anticoagulant use.  States he is prescribed tramadol twice daily as needed.  He was on gabapentin which was switched to another agent.  He was on meloxicam but his doctor discontinued it because he did not want to interact with a heart medication.  Voices no other complaints or injuries other than his right ankle and thigh.     Past Medical History   Past Medical History:  Diagnosis Date   Arthritis    SHOULDERS AND JOINTS   Cancer [facility])    Colon cancer 11/2010 with parital resection with chemo and rad tx   COPD (chronic obstructive pulmonary disease) (HCC)    Dental bridge present    REMOVABLE   Diabetes mellitus without complication (HCC)    TYPE 2, DIET CONTROLLED   Elevated PSA    Hypertension    Neuromuscular disorder (HCC)    WEAKNESS AND NUMBNESS HANDS AND FEET, POSSIBLY FROM CHEMO   Tobacco abuse      Active Problem List   Patient Active Problem List   Diagnosis Date Noted   Central venous catheter in place, temporary 10/22/2020   Malignant neoplasm of sigmoid colon (HCC) 07/30/2015   Chronic obstructive pulmonary disease (HCC) 11/01/2014   H/O disease 11/01/2014   BP (high blood pressure) 11/01/2014   Type 2 diabetes mellitus (HCC) 11/01/2014   Current tobacco use 10/06/2014   H/O malignant neoplasm of colon 11/11/2010     Past Surgical History   Past Surgical History:  Procedure Laterality Date   CERVICAL DISCECTOMY     CERVICAL FUSION     COLON SURGERY  NOV 2012   COLON REMOVED   COLON SURGERY  APRIL 2014   OSTOMY REMOVED    COLONOSCOPY N/A 04/21/2015   Procedure: COLONOSCOPY;  Surgeon: Wallace Cullens, MD;  Location: [facility];  Service: Gastroenterology;  Laterality: N/A;  DIABETIC, DIET CONTROLLED   COLONOSCOPY WITH PROPOFOL N/A 11/07/2018   Procedure: COLONOSCOPY WITH PROPOFOL;  Surgeon: Earline Mayotte, MD;  Location: [facility];  Service: Endoscopy;  Laterality: N/A;   COLOSTOMY REVISION     hx of colostomy     LAMINECTOMY     POLYPECTOMY N/A 04/21/2015   Procedure: POLYPECTOMY;  Surgeon: Wallace Cullens, MD;  Location: [facility];  Service: Gastroenterology;  Laterality: N/A;   TUNNELED VENOUS PORT PLACEMENT       Home Medications   Prior to Admission medications   Medication Sig Start Date End Date Taking? Authorizing Provider  lidocaine (LIDODERM) 5 % Place 1 patch onto the skin daily. Remove & Discard patch within 12 hours or as directed by MD 03/16/23  Yes Irean Hong, MD  acetaminophen (TYLENOL) 650 MG CR tablet Take 650 mg by mouth every 8 (eight) hours as needed for pain.    [provider]  amLODipine (NORVASC) 10 MG tablet Take 10 mg by mouth daily.    [provider]  aspirin 81 MG tablet Take 81 mg by mouth daily. 800AM  [provider]  hydrochlorothiazide (HYDRODIURIL) 12.5 MG tablet Take 12.5 mg by mouth daily. 01/26/16   [provider]  Ibuprofen 200 MG CAPS Take by mouth every 6 (six) hours as needed.    [provider]  losartan (COZAAR) 100 MG tablet Take 100 mg by mouth daily.    [provider]     Allergies  Patient has no known allergies.   Family History  History reviewed. No pertinent family history.   Physical Exam  Triage Vital Signs: ED Triage Vitals  Encounter Vitals Group     BP 03/15/23 2312 (!) 152/71     Systolic BP Percentile --      Diastolic BP Percentile --      Pulse Rate 03/15/23 2312 96     Resp 03/15/23 2312 18     Temp 03/15/23 2312 98 F (36.7 C)     Temp Source 03/15/23 2312  Oral     SpO2 03/15/23 2312 93 %     Weight 03/15/23 2325 165 lb (74.8 kg)     Height 03/15/23 2325 5\' 9"  (1.753 m)     Head Circumference --      Peak Flow --      Pain Score 03/15/23 2325 10     Pain Loc --      Pain Education --      Exclude from Growth Chart --     Updated Vital Signs: BP (!) 150/73   Pulse 94   Temp 98 F (36.7 C) (Oral)   Resp 18   Ht 5\' 9"  (1.753 m)   Wt 74.8 kg   SpO2 92%   BMI 24.37 kg/m    General: Awake, no distress.  CV:  RRR.  Good peripheral perfusion.  Resp:  Normal effort.  CTAB. Abd:  No distention.  Other:  Head is atraumatic.  PERRL.  EOMI.  Nose is atraumatic.  No dental malocclusion.  No midline cervical spine tenderness to palpation, step-offs or deformities noted.  Pelvis is stable.  Full range of both hips without pain.  Point tender to palpation right lateral malleolus which has decreased range of motion secondary to pain.  2+ distal pulses.  Brisk, less than 5-second capillary refill.   ED Results / Procedures / Treatments  Labs (all labs ordered are listed, but only abnormal results are displayed) Labs Reviewed  CBC - Abnormal; Notable for the following components:      Result Value   RDW 16.8 (*)    All other components within normal limits  BASIC METABOLIC PANEL - Abnormal; Notable for the following components:   Potassium 3.4 (*)    Glucose, Bld 109 (*)    Calcium 8.6 (*)    All other components within normal limits  TROPONIN I (HIGH SENSITIVITY) - Abnormal; Notable for the following components:   Troponin I (High Sensitivity) 21 (*)    All other components within normal limits     EKG  None   RADIOLOGY I have independently visualized and interpreted patient's imaging studies as well as noted the radiology interpretation:  Right femur: No acute osseous injury  Right ankle: Small, nondisplaced acute distal tip lateral malleolus fracture  Official radiology report(s): CT Hip Right Wo Contrast Result Date:  03/16/2023 CLINICAL DATA:  Patient fell. Thigh pain. Clinical concern for hip fracture. EXAM: CT OF THE RIGHT HIP WITHOUT CONTRAST TECHNIQUE: Multidetector CT imaging of the right hip was performed according to the standard protocol. Multiplanar CT image  reconstructions were also generated. RADIATION DOSE REDUCTION: This exam was performed according to the departmental dose-optimization program which includes automated exposure control, adjustment of the mA and/or kV according to patient size and/or use of iterative reconstruction technique. COMPARISON:  Abdomen and pelvis CT 09/25/2018 FINDINGS: Bones/Joint/Cartilage No right femoral neck fracture. Right superior and inferior pubic rami are intact. Small joint effusion evident. 1.1 x 2.8 x 1.0 cm lucent lesion identified medial cortex of the femoral metaphysis just anterior to the lesser trochanter. This is new in the interval since the 2022 exam. Ligaments Suboptimally assessed by CT. Muscles and Tendons No evidence for intramuscular or subcutaneous hematoma in the right hip region. No substantial subcutaneous edema. Soft tissues Right groin hernia has been incompletely visualized but contains a segment of colon. No overt complicating features within the visualized portion of the hernia. Prostate gland appears enlarged. IMPRESSION: 1. No acute bony findings in the right hip. No evidence for intramuscular or subcutaneous hematoma. 2. 1.1 x 2.8 x 1.0 cm lucent lesion medial cortex of the femoral metaphysis just anterior to the lesser trochanter. This is new in the interval since the 2022 exam. This is nonspecific and while not overtly concerning, lesion warrants additional imaging follow-up. Nonemergent outpatient MRI of the right hip with and without contrast recommended to further evaluate. 3. Right groin hernia has been incompletely visualized but contains a segment of colon. No overt complicating features within the visualized portion of the hernia.  Electronically Signed   By: Kennith Center M.D.   On: 03/16/2023 05:39   DG Femur Min 2 Views Right Result Date: 03/16/2023 CLINICAL DATA:  Status post fall. EXAM: RIGHT FEMUR 2 VIEWS COMPARISON:  None Available. FINDINGS: There is no evidence of fracture or other focal bone lesions. Mild to moderate severity vascular calcification is seen. Soft tissue structures are otherwise unremarkable. IMPRESSION: No acute osseous abnormality. Electronically Signed   By: Aram Candela M.D.   On: 03/16/2023 00:47   DG Ankle Complete Right Result Date: 03/16/2023 CLINICAL DATA:  Status post fall. EXAM: RIGHT ANKLE - COMPLETE 3+ VIEW COMPARISON:  None Available. FINDINGS: There is a small, nondisplaced acute fracture involving the distal tip of the right lateral malleolus. There is no evidence of dislocation. There is no evidence of arthropathy or other focal bone abnormality. Mild diffuse soft tissue swelling is present. IMPRESSION: Small, nondisplaced acute fracture involving the distal tip of the right lateral malleolus. Electronically Signed   By: Aram Candela M.D.   On: 03/16/2023 00:46     PROCEDURES:  Critical Care performed: No  Procedures   MEDICATIONS ORDERED IN ED: Medications  lidocaine (LIDODERM) 5 % 1 patch (1 patch Transdermal Patch Applied 03/16/23 0255)  ketorolac (TORADOL) injection 30 mg (30 mg Intramuscular Given 03/16/23 0253)     IMPRESSION / MDM / ASSESSMENT AND PLAN / ED COURSE  I reviewed the triage vital signs and the nursing notes.                             78 year old male presenting with right ankle fracture status post mechanical fall.  I have personally reviewed patient's last PCP office visit from 03/08/2023.  Confirm patient is prescribed tramadol to take twice daily.  Administer one-time IM Toradol, apply Lidocaine patch.  Patient has a walker available to him at home; I have strongly encouraged him to use it.  Patient will follow-up with orthopedics.  Strict return  precautions given.  Patient verbalizes understanding and agrees with plan of care.  Patient's presentation is most consistent with acute, uncomplicated illness.  Clinical Course as of 03/16/23 0706  Thu Mar 16, 2023  0400 Patient concerned he will not be able to ambulate due to right thigh pain.  Do not see hip fracture on x-ray; will obtain CT scan.  Will obtain blood work and UA.  If patient does not require medical admission, will consult TOC. [JS]  [id number] CT demonstrates no acute fracture; lucency near greater trochanter recommend outpatient MRI.  Initial troponin mildly elevated; will repeat time troponin, EKG, UA. [JS]  1610 Patient refusing repeat troponin and EKG.  He now has changed his mind and desires to go home.  States his sister will come pick him up.  He has a walker that is conveniently accessible that he can use at home and has family to help him.  Strict return precautions given.  Patient verbalizes understanding and agrees with plan of care. [JS]    Clinical Course User Index [JS] Irean Hong, MD   FINAL CLINICAL IMPRESSION(S) / ED DIAGNOSES   Final diagnoses:  Fall, initial encounter  Closed avulsion fracture of lateral malleolus of right fibula, initial encounter     Rx / DC Orders   ED Discharge Orders          Ordered    lidocaine (LIDODERM) 5 %  Every 24 hours        03/16/23 0235             Note:  This document was prepared using Dragon voice recognition software and may include unintentional dictation errors.   Irean Hong, MD 03/16/23 [phone]

## 2023-03-20 ENCOUNTER — Ambulatory Visit
Admission: RE | Admit: 2023-03-20 | Discharge: 2023-03-20 | Disposition: A | Discharge status: Home / Self Care | Source: Ambulatory Visit | Attending: Internal Medicine | Admitting: Internal Medicine

## 2023-03-20 DIAGNOSIS — G8929 Other chronic pain: Secondary | ICD-10-CM | POA: Insufficient documentation

## 2023-03-20 DIAGNOSIS — M5440 Lumbago with sciatica, unspecified side: Secondary | ICD-10-CM | POA: Insufficient documentation

## 2023-03-20 MED ORDER — GADOBUTROL 1 MMOL/ML IV SOLN
7.0000 mL | Freq: Once | INTRAVENOUS | Status: AC | PRN
  Administered 2023-03-20: 7 mL via INTRAVENOUS

## 2023-03-21 ENCOUNTER — Ambulatory Visit
Admission: RE | Admit: 2023-03-21 | Discharge: 2023-03-21 | Disposition: A | Discharge status: Home / Self Care | Payer: Medicare Other | Source: Ambulatory Visit | Attending: Internal Medicine | Admitting: Internal Medicine

## 2023-03-21 DIAGNOSIS — R918 Other nonspecific abnormal finding of lung field: Secondary | ICD-10-CM | POA: Diagnosis present

## 2023-03-21 DIAGNOSIS — R2689 Other abnormalities of gait and mobility: Secondary | ICD-10-CM | POA: Insufficient documentation

## 2023-03-21 DIAGNOSIS — E119 Type 2 diabetes mellitus without complications: Secondary | ICD-10-CM | POA: Diagnosis present

## 2023-03-21 MED ORDER — IOHEXOL 300 MG/ML  SOLN
75.0000 mL | Freq: Once | INTRAMUSCULAR | Status: AC | PRN
  Administered 2023-03-21: 75 mL via INTRAVENOUS

## 2023-03-31 ENCOUNTER — Inpatient Hospital Stay
Admission: EM | Admit: 2023-03-31 | Discharge: 2023-04-11 | DRG: 552 | Disposition: A | Discharge status: Skilled Nursing Facility | Attending: Student | Admitting: Student

## 2023-03-31 ENCOUNTER — Other Ambulatory Visit: Payer: Self-pay

## 2023-03-31 DIAGNOSIS — J439 Emphysema, unspecified: Secondary | ICD-10-CM | POA: Diagnosis present

## 2023-03-31 DIAGNOSIS — R972 Elevated prostate specific antigen [PSA]: Secondary | ICD-10-CM | POA: Diagnosis present

## 2023-03-31 DIAGNOSIS — G629 Polyneuropathy, unspecified: Secondary | ICD-10-CM

## 2023-03-31 DIAGNOSIS — M545 Low back pain, unspecified: Secondary | ICD-10-CM | POA: Diagnosis present

## 2023-03-31 DIAGNOSIS — I10 Essential (primary) hypertension: Secondary | ICD-10-CM | POA: Diagnosis present

## 2023-03-31 DIAGNOSIS — W19XXXA Unspecified fall, initial encounter: Secondary | ICD-10-CM | POA: Diagnosis present

## 2023-03-31 DIAGNOSIS — J470 Bronchiectasis with acute lower respiratory infection: Secondary | ICD-10-CM | POA: Diagnosis present

## 2023-03-31 DIAGNOSIS — Z9221 Personal history of antineoplastic chemotherapy: Secondary | ICD-10-CM | POA: Diagnosis not present

## 2023-03-31 DIAGNOSIS — M51369 Other intervertebral disc degeneration, lumbar region without mention of lumbar back pain or lower extremity pain: Secondary | ICD-10-CM | POA: Diagnosis present

## 2023-03-31 DIAGNOSIS — Z66 Do not resuscitate: Secondary | ICD-10-CM | POA: Diagnosis not present

## 2023-03-31 DIAGNOSIS — M5441 Lumbago with sciatica, right side: Secondary | ICD-10-CM | POA: Diagnosis not present

## 2023-03-31 DIAGNOSIS — N179 Acute kidney failure, unspecified: Secondary | ICD-10-CM | POA: Diagnosis present

## 2023-03-31 DIAGNOSIS — L89312 Pressure ulcer of right buttock, stage 2: Secondary | ICD-10-CM | POA: Diagnosis present

## 2023-03-31 DIAGNOSIS — J984 Other disorders of lung: Secondary | ICD-10-CM | POA: Diagnosis present

## 2023-03-31 DIAGNOSIS — M899 Disorder of bone, unspecified: Secondary | ICD-10-CM | POA: Diagnosis present

## 2023-03-31 DIAGNOSIS — M5431 Sciatica, right side: Secondary | ICD-10-CM

## 2023-03-31 DIAGNOSIS — Z85038 Personal history of other malignant neoplasm of large intestine: Secondary | ICD-10-CM | POA: Diagnosis not present

## 2023-03-31 DIAGNOSIS — R590 Localized enlarged lymph nodes: Secondary | ICD-10-CM | POA: Diagnosis not present

## 2023-03-31 DIAGNOSIS — J449 Chronic obstructive pulmonary disease, unspecified: Secondary | ICD-10-CM | POA: Diagnosis not present

## 2023-03-31 DIAGNOSIS — J479 Bronchiectasis, uncomplicated: Secondary | ICD-10-CM | POA: Diagnosis present

## 2023-03-31 DIAGNOSIS — E1142 Type 2 diabetes mellitus with diabetic polyneuropathy: Secondary | ICD-10-CM | POA: Diagnosis present

## 2023-03-31 DIAGNOSIS — F17210 Nicotine dependence, cigarettes, uncomplicated: Secondary | ICD-10-CM | POA: Diagnosis present

## 2023-03-31 DIAGNOSIS — Z7982 Long term (current) use of aspirin: Secondary | ICD-10-CM

## 2023-03-31 DIAGNOSIS — Z515 Encounter for palliative care: Secondary | ICD-10-CM

## 2023-03-31 DIAGNOSIS — Z716 Tobacco abuse counseling: Secondary | ICD-10-CM

## 2023-03-31 DIAGNOSIS — R918 Other nonspecific abnormal finding of lung field: Secondary | ICD-10-CM | POA: Diagnosis not present

## 2023-03-31 DIAGNOSIS — E875 Hyperkalemia: Secondary | ICD-10-CM | POA: Diagnosis present

## 2023-03-31 DIAGNOSIS — J440 Chronic obstructive pulmonary disease with acute lower respiratory infection: Secondary | ICD-10-CM | POA: Diagnosis present

## 2023-03-31 DIAGNOSIS — C3411 Malignant neoplasm of upper lobe, right bronchus or lung: Secondary | ICD-10-CM | POA: Diagnosis present

## 2023-03-31 DIAGNOSIS — M48062 Spinal stenosis, lumbar region with neurogenic claudication: Secondary | ICD-10-CM | POA: Diagnosis present

## 2023-03-31 DIAGNOSIS — R319 Hematuria, unspecified: Secondary | ICD-10-CM | POA: Diagnosis present

## 2023-03-31 DIAGNOSIS — Z8249 Family history of ischemic heart disease and other diseases of the circulatory system: Secondary | ICD-10-CM | POA: Diagnosis not present

## 2023-03-31 DIAGNOSIS — K5900 Constipation, unspecified: Secondary | ICD-10-CM | POA: Diagnosis present

## 2023-03-31 DIAGNOSIS — J189 Pneumonia, unspecified organism: Secondary | ICD-10-CM | POA: Diagnosis present

## 2023-03-31 DIAGNOSIS — Z79899 Other long term (current) drug therapy: Secondary | ICD-10-CM

## 2023-03-31 DIAGNOSIS — E860 Dehydration: Secondary | ICD-10-CM | POA: Diagnosis present

## 2023-03-31 DIAGNOSIS — G8929 Other chronic pain: Secondary | ICD-10-CM | POA: Diagnosis present

## 2023-03-31 DIAGNOSIS — C7951 Secondary malignant neoplasm of bone: Secondary | ICD-10-CM | POA: Diagnosis present

## 2023-03-31 DIAGNOSIS — M25552 Pain in left hip: Secondary | ICD-10-CM | POA: Diagnosis present

## 2023-03-31 DIAGNOSIS — R531 Weakness: Secondary | ICD-10-CM | POA: Diagnosis present

## 2023-03-31 DIAGNOSIS — J948 Other specified pleural conditions: Secondary | ICD-10-CM | POA: Diagnosis present

## 2023-03-31 DIAGNOSIS — R112 Nausea with vomiting, unspecified: Secondary | ICD-10-CM | POA: Diagnosis not present

## 2023-03-31 DIAGNOSIS — M25551 Pain in right hip: Secondary | ICD-10-CM | POA: Diagnosis present

## 2023-03-31 DIAGNOSIS — Z933 Colostomy status: Secondary | ICD-10-CM

## 2023-03-31 DIAGNOSIS — Z85048 Personal history of other malignant neoplasm of rectum, rectosigmoid junction, and anus: Secondary | ICD-10-CM

## 2023-03-31 DIAGNOSIS — E785 Hyperlipidemia, unspecified: Secondary | ICD-10-CM | POA: Diagnosis present

## 2023-03-31 DIAGNOSIS — Z720 Tobacco use: Secondary | ICD-10-CM | POA: Diagnosis not present

## 2023-03-31 LAB — URINALYSIS, ROUTINE W REFLEX MICROSCOPIC
Bilirubin Urine: NEGATIVE
Glucose, UA: NEGATIVE mg/dL
Hgb urine dipstick: NEGATIVE
Ketones, ur: NEGATIVE mg/dL
Leukocytes,Ua: NEGATIVE
Nitrite: NEGATIVE
Protein, ur: NEGATIVE mg/dL
Specific Gravity, Urine: 1.016 (ref 1.005–1.030)
pH: 6 (ref 5.0–8.0)

## 2023-03-31 LAB — CBC
HCT: 45.4 % (ref 39.0–52.0)
Hemoglobin: 13.9 g/dL (ref 13.0–17.0)
MCH: 26.6 pg (ref 26.0–34.0)
MCHC: 30.6 g/dL (ref 30.0–36.0)
MCV: 87 fL (ref 80.0–100.0)
Platelets: 345 10*3/uL (ref 150–400)
RBC: 5.22 MIL/uL (ref 4.22–5.81)
RDW: 17.2 % — ABNORMAL HIGH (ref 11.5–15.5)
WBC: 7 10*3/uL (ref 4.0–10.5)
nRBC: 0 % (ref 0.0–0.2)

## 2023-03-31 LAB — COMPREHENSIVE METABOLIC PANEL
ALT: 15 U/L (ref 0–44)
AST: 24 U/L (ref 15–41)
Albumin: 2.9 g/dL — ABNORMAL LOW (ref 3.5–5.0)
Alkaline Phosphatase: 93 U/L (ref 38–126)
Anion gap: 8 (ref 5–15)
BUN: 17 mg/dL (ref 8–23)
CO2: 26 mmol/L (ref 22–32)
Calcium: 8.8 mg/dL — ABNORMAL LOW (ref 8.9–10.3)
Chloride: 105 mmol/L (ref 98–111)
Creatinine, Ser: 0.89 mg/dL (ref 0.61–1.24)
GFR, Estimated: >60 mL/min (ref >60)
Glucose, Bld: 103 mg/dL — ABNORMAL HIGH (ref 70–99)
Potassium: 4.4 mmol/L (ref 3.5–5.1)
Sodium: 139 mmol/L (ref 135–145)
Total Bilirubin: 0.7 mg/dL (ref 0.0–1.2)
Total Protein: 7 g/dL (ref 6.5–8.1)

## 2023-03-31 MED ORDER — LOSARTAN POTASSIUM 50 MG PO TABS
100.0000 mg | ORAL_TABLET | Freq: Every day | ORAL | Status: DC
Start: 2023-04-01 — End: 2023-04-03
  Filled 2023-03-31 (×3): qty 2

## 2023-03-31 MED ORDER — TRAZODONE HCL 50 MG PO TABS: 25.0000 mg | ORAL_TABLET | Freq: Every evening | ORAL | Status: DC | PRN

## 2023-03-31 MED ORDER — DEXAMETHASONE SODIUM PHOSPHATE 10 MG/ML IJ SOLN
10.0000 mg | Freq: Once | INTRAMUSCULAR | Status: AC
  Administered 2023-03-31: 10 mg via INTRAVENOUS
  Filled 2023-03-31: qty 1

## 2023-03-31 MED ORDER — MAGNESIUM HYDROXIDE 400 MG/5ML PO SUSP
30.0000 mL | Freq: Every day | ORAL | Status: DC | PRN
  Administered 2023-04-06: 30 mL via ORAL
  Filled 2023-03-31 (×2): qty 30

## 2023-03-31 MED ORDER — AMLODIPINE BESYLATE 5 MG PO TABS
10.0000 mg | ORAL_TABLET | Freq: Every day | ORAL | Status: DC
  Administered 2023-04-01 – 2023-04-02 (×2): 10 mg via ORAL
  Filled 2023-03-31 (×3): qty 2

## 2023-03-31 MED ORDER — ONDANSETRON HCL 4 MG/2ML IJ SOLN
4.0000 mg | Freq: Four times a day (QID) | INTRAMUSCULAR | Status: DC | PRN
  Administered 2023-04-02 – 2023-04-07 (×5): 4 mg via INTRAVENOUS
  Filled 2023-03-31 (×5): qty 2

## 2023-03-31 MED ORDER — ACETAMINOPHEN 325 MG PO TABS
650.0000 mg | ORAL_TABLET | Freq: Once | ORAL | Status: AC
  Administered 2023-03-31: 650 mg via ORAL
  Filled 2023-03-31: qty 2

## 2023-03-31 MED ORDER — ONDANSETRON HCL 4 MG PO TABS: 4.0000 mg | ORAL_TABLET | Freq: Four times a day (QID) | ORAL | Status: DC | PRN

## 2023-03-31 MED ORDER — SODIUM CHLORIDE 0.9 % IV SOLN: INTRAVENOUS | Status: AC

## 2023-03-31 MED ORDER — LIDOCAINE 5 % EX PTCH
1.0000 | MEDICATED_PATCH | CUTANEOUS | Status: DC
  Administered 2023-03-31 – 2023-04-10 (×11): 1 via TRANSDERMAL
  Filled 2023-03-31 (×11): qty 1

## 2023-03-31 MED ORDER — ACETAMINOPHEN 325 MG PO TABS
650.0000 mg | ORAL_TABLET | Freq: Four times a day (QID) | ORAL | Status: DC | PRN
  Administered 2023-04-05 – 2023-04-08 (×4): 650 mg via ORAL
  Filled 2023-03-31 (×4): qty 2

## 2023-03-31 MED ORDER — ACETAMINOPHEN 650 MG RE SUPP: 650.0000 mg | Freq: Four times a day (QID) | RECTAL | Status: DC | PRN

## 2023-03-31 MED ORDER — ENOXAPARIN SODIUM 40 MG/0.4ML IJ SOSY
40.0000 mg | PREFILLED_SYRINGE | INTRAMUSCULAR | Status: DC
  Administered 2023-04-01 – 2023-04-11 (×11): 40 mg via SUBCUTANEOUS
  Filled 2023-03-31 (×11): qty 0.4

## 2023-03-31 MED ORDER — MORPHINE SULFATE (PF) 2 MG/ML IV SOLN
2.0000 mg | INTRAVENOUS | Status: DC | PRN
  Administered 2023-04-01 – 2023-04-02 (×3): 2 mg via INTRAVENOUS
  Filled 2023-03-31 (×3): qty 1

## 2023-03-31 MED ORDER — HYDROCHLOROTHIAZIDE 12.5 MG PO TABS
12.5000 mg | ORAL_TABLET | Freq: Every day | ORAL | Status: DC
  Administered 2023-04-01: 12.5 mg via ORAL
  Filled 2023-03-31 (×3): qty 1

## 2023-03-31 NOTE — Assessment & Plan Note (Signed)
-   We will continue antihypertensive therapy.

## 2023-03-31 NOTE — ED Notes (Signed)
 Bilateral sciatica pain chronic unable to ambulate @ this time pt aox4 speaking in full clear sentences skin appears wdi pt calm swallowing water without apparent difficulty non labored resps noted equal bilateral chest rise and fall

## 2023-03-31 NOTE — ED Provider Notes (Signed)
 [facility] Emergency Department Provider Note     Event Date/Time   First MD Initiated Contact with Patient 03/31/23 1748     (approximate)   History   Leg Pain (Chronic sciatica )   HPI  Cameron Howe is a 78 y.o. male with a history of HTN, arthritis, diabetes, COPD and tobacco use presents to the ED for evaluation of acute on chronic back pain.  Patient reports gradual worsening of right sided sciatica pain.  He reports he usually walks with a walker at home but has been unable to transfer himself around the house.  Patient reports he lives alone.  He denies loss of bowel and bladder control.  He denies saddle anesthesia.     Physical Exam   Triage Vital Signs: ED Triage Vitals  Encounter Vitals Group     BP 03/31/23 1520 (!) 172/75     Systolic BP Percentile --      Diastolic BP Percentile --      Pulse Rate 03/31/23 1520 83     Resp 03/31/23 1520 17     Temp 03/31/23 1520 98 F (36.7 C)     Temp Source 03/31/23 1520 Oral     SpO2 03/31/23 1520 95 %     Weight --      Height --      Head Circumference --      Peak Flow --      Pain Score 03/31/23 1525 9     Pain Loc --      Pain Education --      Exclude from Growth Chart --     Most recent vital signs: Vitals:   03/31/23 2233 03/31/23 2248  BP:  (!) 144/66  Pulse: 82   Resp: 15   Temp:    SpO2: 92%     General: Alert and oriented. INAD.  Head:  NCAT.  Neck:   No cervical spine tenderness to palpation. Full ROM without difficulty.  CV:  Good peripheral perfusion. RRR.  RESP:  Normal effort. LCTAB. No retractions.  BACK:  Spinous process is midline without deformity. Tenderness to palpation in lumbar region  MSK:   Positive Right STLR @ 25-35 degrees.   NEURO: Cranial nerves intact. No focal deficits. Sensation and motor function intact.    ED Results / Procedures / Treatments   Labs (all labs ordered are listed, but only abnormal results are displayed) Labs Reviewed   CBC - Abnormal; Notable for the following components:      Result Value   RDW 17.2 (*)    All other components within normal limits  COMPREHENSIVE METABOLIC PANEL - Abnormal; Notable for the following components:   Glucose, Bld 103 (*)    Calcium 8.8 (*)    Albumin 2.9 (*)    All other components within normal limits  URINALYSIS, ROUTINE W REFLEX MICROSCOPIC - Abnormal; Notable for the following components:   Color, Urine YELLOW (*)    APPearance CLEAR (*)    All other components within normal limits  BASIC METABOLIC PANEL  CBC   No results found.  PROCEDURES:  Critical Care performed: No  Procedures   MEDICATIONS ORDERED IN ED: Medications  amLODipine (NORVASC) tablet 10 mg (has no administration in time range)  hydrochlorothiazide (HYDRODIURIL) tablet 12.5 mg (12.5 mg Oral Not Given 03/31/23 2247)  losartan (COZAAR) tablet 100 mg (has no administration in time range)  lidocaine (LIDODERM) 5 % 1 patch (1 patch Transdermal  Patch Applied 03/31/23 2234)  enoxaparin (LOVENOX) injection 40 mg (has no administration in time range)  0.9 %  sodium chloride infusion (has no administration in time range)  acetaminophen (TYLENOL) tablet 650 mg (has no administration in time range)    Or  acetaminophen (TYLENOL) suppository 650 mg (has no administration in time range)  traZODone (DESYREL) tablet 25 mg (has no administration in time range)  magnesium hydroxide (MILK OF MAGNESIA) suspension 30 mL (has no administration in time range)  ondansetron (ZOFRAN) tablet 4 mg (has no administration in time range)    Or  ondansetron (ZOFRAN) injection 4 mg (has no administration in time range)  morphine (PF) 2 MG/ML injection 2 mg (has no administration in time range)  acetaminophen (TYLENOL) tablet 650 mg (650 mg Oral Given 03/31/23 1959)  dexamethasone (DECADRON) injection 10 mg (10 mg Intravenous Given 03/31/23 2234)     IMPRESSION / MDM / ASSESSMENT AND PLAN / ED COURSE  I reviewed the  triage vital signs and the nursing notes.                              Clinical Course as of 03/31/23 2310  Fri Mar 31, 2023  2111 Discussed with Dr. Marcell Barlow who agreed with admission.  Will consult hospitalist. [MH]    Clinical Course User Index [MH] Kern Reap A, PA-C    78 y.o. male presents to the emergency department for evaluation and treatment of acute on chronic back pain. See HPI for further details.   Differential diagnosis includes, but is not limited to disc herniation, radiculopathy  Patient's presentation is most consistent with acute complicated illness / injury requiring diagnostic workup.  Chart reviewed.  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 *3.8 x 2.7 cm in maximum diameter bone lesion, hyperintense T2 weighted images mass which appears to be projecting within the posteromedial aspect of the left gluteus muscle against the adjacent iliac posterosuperior spine there is no enhancement after the intravenous administration of contrast material. This is a new lesion since prior examination. Significance is unclear, perhaps ultrasound-guided aspiration may provide additional diagnostic information. If lesion can not be seen on ultrasound consider CT-guided biopsy.  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  Patient admits to tobacco use daily.  Consulted with neurosurgery of MRI results and agreed for admission.  Transferring care to hospitalist team.   FINAL CLINICAL IMPRESSION(S) / ED DIAGNOSES   Final diagnoses:  None   Rx / DC Orders   ED Discharge Orders     None        Note:  This document was prepared using Dragon voice recognition software and may include unintentional dictation errors.    Romeo Apple, Ilisa Hayworth A, PA-C 03/31/23 2310    Phineas Semen, MD 04/07/23 [phone]

## 2023-03-31 NOTE — H&P (Signed)
 [place]   PATIENT NAME: Cameron Howe    MR#:  [id number]  DATE OF BIRTH:  1945/07/02  DATE OF ADMISSION:  03/31/2023  PRIMARY CARE PHYSICIAN: Barbette Reichmann, MD   Patient is coming from: Home  REQUESTING/REFERRING PHYSICIAN: Kern Reap A, PA-C  CHIEF COMPLAINT:   Chief Complaint  Patient presents with   Leg Pain    Chronic sciatica     HISTORY OF PRESENT ILLNESS:  Cameron Howe is a 78 y.o. male with medical history significant for osteoarthritis, COPD, type 2 diabetes mellitus, essential, hypertension and tobacco abuse, who presented to the emergency room with acute onset of right-sided sciatica-like pain that has been significantly worsening lately giving him trouble with ambulation.  He was able to get from bed to wheelchair and currently is not able to ambulate or sit in his wheelchair.  He is not able to perform his ADLs secondarily.  His pain has been going on over the last 6 years.  No fever or chills.  No nausea or vomiting or abdominal pain.  No chest pain or palpitations.  No cough or wheezing or dyspnea.  No other paresthesias or focal muscle weakness.  ED Course: When the patient  came to the ER, BP was 172/75 with otherwise normal vital signs.  Labs revealed albumin of 2.9 with otherwise unremarkable CMP.  CBC was within normal. 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 *3.8 x 2.7 cm in maximum diameter bone lesion, hyperintense T2 weighted images mass which appears to be projecting within the posteromedial aspect of the left gluteus muscle against the adjacent iliac posterosuperior spine there is no enhancement after the intravenous administration of contrast material. This is a new lesion since prior examination. Significance is unclear, perhaps ultrasound-guided  aspiration may provide additional diagnostic information. If lesion can not be seen on ultrasound consider CT-guided biopsy.  The patient was given 650 mg p.o. Tylenol.  He will be admitted to a medical bed for further evaluation and management.  PAST MEDICAL HISTORY:   Past Medical History:  Diagnosis Date   Arthritis    SHOULDERS AND JOINTS   Cancer [facility])    Colon cancer 11/2010 with parital resection with chemo and rad tx   COPD (chronic obstructive pulmonary disease) (HCC)    Dental bridge present    REMOVABLE   Diabetes mellitus without complication (HCC)    TYPE 2, DIET CONTROLLED   Elevated PSA    Hypertension    Neuromuscular disorder (HCC)    WEAKNESS AND NUMBNESS HANDS AND FEET, POSSIBLY FROM CHEMO   Tobacco abuse     PAST SURGICAL HISTORY:   Past Surgical History:  Procedure Laterality Date   CERVICAL DISCECTOMY     CERVICAL FUSION     COLON SURGERY  NOV 2012   COLON REMOVED   COLON SURGERY  APRIL 2014   OSTOMY REMOVED   COLONOSCOPY N/A 04/21/2015   Procedure: COLONOSCOPY;  Surgeon: Wallace Cullens, MD;  Location: [facility];  Service: Gastroenterology;  Laterality: N/A;  DIABETIC, DIET CONTROLLED   COLONOSCOPY WITH PROPOFOL N/A 11/07/2018   Procedure: COLONOSCOPY WITH PROPOFOL;  Surgeon: Earline Mayotte, MD;  Location: [facility] END[facility];  Service: Endoscopy;  Laterality: N/A;   COLOSTOMY REVISION     hx of colostomy     LAMINECTOMY  POLYPECTOMY N/A 04/21/2015   Procedure: POLYPECTOMY;  Surgeon: Wallace Cullens, MD;  Location: [facility];  Service: Gastroenterology;  Laterality: N/A;   TUNNELED VENOUS PORT PLACEMENT      SOCIAL HISTORY:   Social History   Tobacco Use   Smoking status: Every Day    Current packs/day: 0.50    Average packs/day: 0.5 packs/day for 30.0 years (15.0 ttl pk-yrs)    Types: Cigarettes   Smokeless tobacco: Never  Substance Use Topics   Alcohol use: No    Alcohol/week: 0.0 standard drinks of alcohol    FAMILY  HISTORY:   His father died from MI.  DRUG ALLERGIES:  No Known Allergies  REVIEW OF SYSTEMS:   ROS As per history of present illness. All pertinent systems were reviewed above. Constitutional, HEENT, cardiovascular, respiratory, GI, GU, musculoskeletal, neuro, psychiatric, endocrine, integumentary and hematologic systems were reviewed and are otherwise negative/unremarkable except for positive findings mentioned above in the HPI.   MEDICATIONS AT HOME:   Prior to Admission medications   Medication Sig Start Date End Date Taking? Authorizing Provider  acetaminophen (TYLENOL) 650 MG CR tablet Take 650 mg by mouth every 8 (eight) hours as needed for pain.    [provider]  amLODipine (NORVASC) 10 MG tablet Take 10 mg by mouth daily.    [provider]  aspirin 81 MG tablet Take 81 mg by mouth daily. 800AM    [provider]  hydrochlorothiazide (HYDRODIURIL) 12.5 MG tablet Take 12.5 mg by mouth daily. 01/26/16   [provider]  Ibuprofen 200 MG CAPS Take by mouth every 6 (six) hours as needed.    [provider]  lidocaine (LIDODERM) 5 % Place 1 patch onto the skin daily. Remove & Discard patch within 12 hours or as directed by MD 03/16/23   Irean Hong, MD  losartan (COZAAR) 100 MG tablet Take 100 mg by mouth daily.    [provider]      VITAL SIGNS:  Blood pressure (!) 144/66, pulse 63, temperature 98.4 F (36.9 C), temperature source Oral, resp. rate 20, SpO2 96%.  PHYSICAL EXAMINATION:  Physical Exam  GENERAL:  78 y.o.-year-old African-American male patient lying in the bed with no acute distress.  EYES: Pupils equal, round, reactive to light and accommodation. No scleral icterus. Extraocular muscles intact.  HEENT: Head atraumatic, normocephalic. Oropharynx and nasopharynx clear.  NECK:  Supple, no jugular venous distention. No thyroid enlargement, no tenderness.  LUNGS: Normal breath sounds bilaterally, no wheezing,  rales,rhonchi or crepitation. No use of accessory muscles of respiration.  CARDIOVASCULAR: Regular rate and rhythm, S1, S2 normal. No murmurs, rubs, or gallops.  ABDOMEN: Soft, nondistended, nontender. Bowel sounds present. No organomegaly or mass.  EXTREMITIES: No pedal edema, cyanosis, or clubbing.  NEUROLOGIC: Cranial nerves II through XII are intact. Muscle strength 5/5 in all extremities. Sensation intact. Gait not checked. Musculoskeletal: Negative right straight leg raising test. PSYCHIATRIC: The patient is alert and oriented x 3.  Normal affect and good eye contact. SKIN: No obvious rash, lesion, or ulcer.   LABORATORY PANEL:   CBC Recent Labs  Lab 03/31/23 1940  WBC 7.0  HGB 13.9  HCT 45.4  PLT 345   ------------------------------------------------------------------------------------------------------------------  Chemistries  Recent Labs  Lab 03/31/23 1940  NA 139  K 4.4  CL 105  CO2 26  GLUCOSE 103*  BUN 17  CREATININE 0.89  CALCIUM 8.8*  AST 24  ALT 15  ALKPHOS 93  BILITOT 0.7   ------------------------------------------------------------------------------------------------------------------  Cardiac Enzymes No results for input(s): "TROPONINI" in the last 168 hours. ------------------------------------------------------------------------------------------------------------------  RADIOLOGY:  No results found.    IMPRESSION AND PLAN:  Assessment and Plan: * Acute on chronic low back pain - The patient be admitted to a medical surgical bed. - This is associated with right-sided sciatica. - Pain management will be provided. - Neurosurgery consult will be obtained. - Dr. Marcell Barlow was notified and is aware about the patient. - We will add 1 dose of Decadron for now.  Type 2 diabetes mellitus with peripheral neuropathy (HCC) - The patient will be placed on supplemental coverage with NovoLog. - We will continue Neurontin as mentioned  above.  Peripheral neuropathy - This is associated with right-sided sciatica. - We will continue Neurontin.  Dyslipidemia - We will continue statin therapy.  Essential hypertension - We will continue antihypertensive therapy.   DVT prophylaxis: Lovenox.  Advanced Care Planning:  Code Status: full code.  Family Communication:  The plan of care was discussed in details with the patient (and family). I answered all questions. The patient agreed to proceed with the above mentioned plan. Further management will depend upon hospital course. Disposition Plan: Back to previous home environment Consults called: Neurosurgery All the records are reviewed and case discussed with ED provider.  Status is: Inpatient  At the time of the admission, it appears that the appropriate admission status for this patient is inpatient.  This is judged to be reasonable and necessary in order to provide the required intensity of service to ensure the patient's safety given the presenting symptoms, physical exam findings and initial radiographic and laboratory data in the context of comorbid conditions.  The patient requires inpatient status due to high intensity of service, high risk of further deterioration and high frequency of surveillance required.  I certify that at the time of admission, it is my clinical judgment that the patient will require inpatient hospital care extending more than 2 midnights.                            Dispo: The patient is from: Home              Anticipated d/c is to: Home              Patient currently is not medically stable to d/c.              Difficult to place patient: No  Hannah Beat M.D on 04/01/2023 at 1:59 AM  [facility]   From 7 PM-7 AM, contact night-coverage [email or web address]  CC: Primary care physician; Barbette Reichmann, MD

## 2023-03-31 NOTE — ED Notes (Signed)
 Pt provided food per NP v/o pt "can eat food"

## 2023-03-31 NOTE — ED Notes (Signed)
 Receiving RN updated pt otf pt and family updated on poc room assignment

## 2023-03-31 NOTE — Assessment & Plan Note (Signed)
 -  We will continue statin therapy.

## 2023-03-31 NOTE — Assessment & Plan Note (Signed)
-   This is associated with right-sided sciatica. - We will continue Neurontin.

## 2023-03-31 NOTE — Assessment & Plan Note (Signed)
-   The patient will be placed on supplemental coverage with NovoLog. - We will continue Neurontin as mentioned above.

## 2023-03-31 NOTE — Assessment & Plan Note (Addendum)
-   The patient be admitted to a medical surgical bed. - This is associated with right-sided sciatica. - Pain management will be provided. - Neurosurgery consult will be obtained. - Dr. Marcell Barlow was notified and is aware about the patient. - We will add 1 dose of Decadron for now.

## 2023-03-31 NOTE — ED Notes (Signed)
 Patient transferred from recliner to stretcher with slide board and NT, [place]

## 2023-03-31 NOTE — ED Triage Notes (Addendum)
 Pt from home via ems- c/o right sciatica pain x6 years that he states has been getting gradually worse and has gone from being able to go from bed to wheelchair, to not being able to ambulate or sit in wheelchair. CMS intact, capp refill <3 seconds. Pt states he's unable to toilet himself and lives alone.

## 2023-04-01 ENCOUNTER — Inpatient Hospital Stay

## 2023-04-01 DIAGNOSIS — Z720 Tobacco use: Secondary | ICD-10-CM | POA: Diagnosis not present

## 2023-04-01 DIAGNOSIS — G8929 Other chronic pain: Secondary | ICD-10-CM | POA: Diagnosis not present

## 2023-04-01 DIAGNOSIS — R590 Localized enlarged lymph nodes: Secondary | ICD-10-CM

## 2023-04-01 DIAGNOSIS — M48062 Spinal stenosis, lumbar region with neurogenic claudication: Secondary | ICD-10-CM

## 2023-04-01 DIAGNOSIS — R918 Other nonspecific abnormal finding of lung field: Secondary | ICD-10-CM

## 2023-04-01 DIAGNOSIS — M545 Low back pain, unspecified: Secondary | ICD-10-CM | POA: Diagnosis not present

## 2023-04-01 DIAGNOSIS — J449 Chronic obstructive pulmonary disease, unspecified: Secondary | ICD-10-CM | POA: Diagnosis not present

## 2023-04-01 LAB — BASIC METABOLIC PANEL
Anion gap: 9 (ref 5–15)
BUN: 18 mg/dL (ref 8–23)
CO2: 26 mmol/L (ref 22–32)
Calcium: 9.1 mg/dL (ref 8.9–10.3)
Chloride: 105 mmol/L (ref 98–111)
Creatinine, Ser: 0.88 mg/dL (ref 0.61–1.24)
GFR, Estimated: >60 mL/min (ref >60)
Glucose, Bld: 135 mg/dL — ABNORMAL HIGH (ref 70–99)
Potassium: 4.2 mmol/L (ref 3.5–5.1)
Sodium: 140 mmol/L (ref 135–145)

## 2023-04-01 LAB — CBC
HCT: 43 % (ref 39.0–52.0)
Hemoglobin: 13.4 g/dL (ref 13.0–17.0)
MCH: 26.4 pg (ref 26.0–34.0)
MCHC: 31.2 g/dL (ref 30.0–36.0)
MCV: 84.8 fL (ref 80.0–100.0)
Platelets: 361 10*3/uL (ref 150–400)
RBC: 5.07 MIL/uL (ref 4.22–5.81)
RDW: 16.9 % — ABNORMAL HIGH (ref 11.5–15.5)
WBC: 5.8 10*3/uL (ref 4.0–10.5)
nRBC: 0 % (ref 0.0–0.2)

## 2023-04-01 MED ORDER — IOHEXOL 300 MG/ML  SOLN
75.0000 mL | Freq: Once | INTRAMUSCULAR | Status: AC | PRN
  Administered 2023-04-01: 75 mL via INTRAVENOUS

## 2023-04-01 MED ORDER — ALBUTEROL SULFATE HFA 108 (90 BASE) MCG/ACT IN AERS: 2.0000 | INHALATION_SPRAY | Freq: Four times a day (QID) | RESPIRATORY_TRACT | Status: DC | PRN

## 2023-04-01 MED ORDER — HYDROMORPHONE HCL 1 MG/ML IJ SOLN
1.0000 mg | Freq: Once | INTRAMUSCULAR | Status: AC
  Administered 2023-04-01: 1 mg via INTRAVENOUS
  Filled 2023-04-01: qty 1

## 2023-04-01 MED ORDER — OXYCODONE HCL 5 MG PO TABS
5.0000 mg | ORAL_TABLET | ORAL | Status: DC | PRN
  Administered 2023-04-02 – 2023-04-11 (×23): 5 mg via ORAL
  Filled 2023-04-01 (×24): qty 1

## 2023-04-01 MED ORDER — UMECLIDINIUM-VILANTEROL 62.5-25 MCG/ACT IN AEPB
1.0000 | INHALATION_SPRAY | Freq: Every day | RESPIRATORY_TRACT | Status: DC
  Administered 2023-04-01 – 2023-04-11 (×11): 1 via RESPIRATORY_TRACT
  Filled 2023-04-01 (×2): qty 14

## 2023-04-01 NOTE — Plan of Care (Signed)
  Problem: Coping: Goal: Level of anxiety will decrease Outcome: Progressing   Problem: Elimination: Goal: Will not experience complications related to bowel motility Outcome: Progressing   Problem: Pain Managment: Goal: General experience of comfort will improve and/or be controlled Outcome: Progressing   Problem: Safety: Goal: Ability to remain free from injury will improve Outcome: Progressing

## 2023-04-01 NOTE — Consult Note (Signed)
 Consult requested by:  Dr. Lucianne Muss  Consult requested for:  Lumbar disease, pain, leg pain  Primary Physician:  Barbette Reichmann, MD  History of Present Illness: 04/01/2023 Mr. Cameron Howe is here today with a chief complaint of right leg pain and back pain.  He presented yesterday with difficulty with leg pain.  He had substantial right sided pain into his buttock and to his upper leg that caused him difficulty with walking and standing.  He was unable to ambulate.  He lives alone, so this impacted his ability to go about his activities of daily living.  He denies weakness.  He denies weight loss, fevers, or chills.    Past Surgery: anterior and posterior cervical reconstruction (by me in 2018)  Marijean Niemann has no symptoms of cervical myelopathy.  The symptoms are causing a significant impact on the patient's life.   I have utilized the care everywhere function in epic to review the outside records available from external health systems.  Review of Systems:  A 10 point review of systems is negative, except for the pertinent positives and negatives detailed in the HPI.  Past Medical History: Past Medical History:  Diagnosis Date   Arthritis    SHOULDERS AND JOINTS   Cancer [facility])    Colon cancer 11/2010 with parital resection with chemo and rad tx   COPD (chronic obstructive pulmonary disease) (HCC)    Dental bridge present    REMOVABLE   Diabetes mellitus without complication (HCC)    TYPE 2, DIET CONTROLLED   Elevated PSA    Hypertension    Neuromuscular disorder (HCC)    WEAKNESS AND NUMBNESS HANDS AND FEET, POSSIBLY FROM CHEMO   Tobacco abuse     Past Surgical History: Past Surgical History:  Procedure Laterality Date   CERVICAL DISCECTOMY     CERVICAL FUSION     COLON SURGERY  NOV 2012   COLON REMOVED   COLON SURGERY  APRIL 2014   OSTOMY REMOVED   COLONOSCOPY N/A 04/21/2015   Procedure: COLONOSCOPY;  Surgeon: Wallace Cullens, MD;  Location: [facility];  Service: Gastroenterology;  Laterality: N/A;  DIABETIC, DIET CONTROLLED   COLONOSCOPY WITH PROPOFOL N/A 11/07/2018   Procedure: COLONOSCOPY WITH PROPOFOL;  Surgeon: Earline Mayotte, MD;  Location: [facility] END[facility];  Service: Endoscopy;  Laterality: N/A;   COLOSTOMY REVISION     hx of colostomy     LAMINECTOMY     POLYPECTOMY N/A 04/21/2015   Procedure: POLYPECTOMY;  Surgeon: Wallace Cullens, MD;  Location: [facility];  Service: Gastroenterology;  Laterality: N/A;   TUNNELED VENOUS PORT PLACEMENT      Allergies: Allergies as of 03/31/2023   (No Known Allergies)    Medications: No outpatient medications have been marked as taking for the 03/31/23 encounter [facility] Encounter).    Social History: Social History   Tobacco Use   Smoking status: Every Day    Current packs/day: 0.50    Average packs/day: 0.5 packs/day for 30.0 years (15.0 ttl pk-yrs)    Types: Cigarettes   Smokeless tobacco: Never  Substance Use Topics   Alcohol use: No    Alcohol/week: 0.0 standard drinks of alcohol   Drug use: No    Family Medical History: History reviewed. No pertinent family history.  Physical Examination: Vitals:   03/31/23 2248 04/01/23 0805  BP: (!) 144/66 (!) 154/74  Pulse: 63 89  Resp: 20 18  Temp: 98.4 F (36.9 C) 97.8 F (36.6  C)  SpO2: 96% 95%    General: Patient is in no apparent distress. Attention to examination is appropriate.  Neck:   Supple.  Nearly full range of motion.  Respiratory: Patient is breathing without any difficulty.   NEUROLOGICAL:     Awake, alert, oriented to person, place, and time.  Speech is clear and fluent.  Cranial Nerves: Pupils equal round and reactive to light.  Facial tone is symmetric.  Facial sensation is symmetric. Shoulder shrug is symmetric. Tongue protrusion is midline.   Strength: Side Biceps Triceps Deltoid Interossei Grip Wrist Ext. Wrist Flex.  R 5 5 5 5 5 5 5   L 5 5 5 5 5 5 5    Side Iliopsoas Quads Hamstring  PF DF EHL  R 5 5 5 5 5 5   L 5 5 5 5 5 5    Reflexes are 1+ and symmetric at the biceps, triceps, brachioradialis, patella and achilles.    Bilateral upper and lower extremity sensation is intact to light touch.    No evidence of dysmetria noted.  Gait is untested.     Medical Decision Making  Imaging: MRI L spine 03/31/2023 L2 - L3 severe narrowing of the disc space. With a large central spondylitic disc herniation bone bridge complex extending right and left laterally into both neural foramina producing severe central canal stenosis, thecal sac compression aggravated by the hypertrophic osteoarthritic changes of the posterior elements, ligamentum flavum and hypertrophy of the facet joints. These findings are basically unchanged since prior examination.   L3 - L4 severe narrowing of the disc space with a central, right and left paracentral spondylitic disc herniation bone bridge complex flattening the thecal sac and encroaching on both neural foramina, with significant central canal stenosis aggravated by the hypertrophic osteoarthritic changes of the posterior elements similar to prior examination.   L4 - L5 narrowing of the disc space with degenerative disc disease spondylitic central left paracentral disc herniation bone bridge complex flattening the thecal sac and encroaching the left neural foramina aggravated by hypertrophic osteoarthritic changes of the left facet joints and ligamentum flavum.   L5 - S1 No disc protrusion. No foraminal stenosis. No central canal stenosis.   3.8 by 2.7 cm in maximum diameter bone lesion,, hyperintense T2 weighted images mass which appears to be projecting within the posteromedial aspect of the left gluteus muscle against the adjacent iliac posterosuperior spine there is no enhancement after the intravenous administration of contrast material. This is a new lesion since prior examination. And on these images there is no clear evidence  of bone involvement. Significance is unclear, perhaps ultrasound-guided aspiration may provide additional diagnostic information. If lesion can not be seen on ultrasound consider CT-guided biopsy.   No abnormal enhancement after the intravenous administration of contrast material IMPRESSION: *Multilevel degenerative disc disease with spondylitic disc herniations and central canal stenosis as described above. *3.8 x 2.7 cm in maximum diameter bone lesion, hyperintense T2 weighted images mass which appears to be projecting within the posteromedial aspect of the left gluteus muscle against the adjacent iliac posterosuperior spine there is no enhancement after the intravenous administration of contrast material. This is a new lesion since prior examination. Significance is unclear, perhaps ultrasound-guided aspiration may provide additional diagnostic information. If lesion can not be seen on ultrasound consider CT-guided biopsy.     Electronically Signed   By: Shaaron Adler M.D.   On: 03/31/2023 10:47  I have personally reviewed the images and agree with the above interpretation.  Assessment and Plan: Mr. Budhu is a pleasant 78 y.o. 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  This is chronic in nature.  This could likely be causing the pain in his right leg.  The CT chest and MRI scan also have findings concerning for primary lung cancer with potential metastatic involvement.  This has not been fully worked up.  I would recommend CT abdomen pelvis with contrast and consideration of IR versus pulmonology consultation for biopsy.  He does have history of colon cancer, so may ultimately require more than 1 biopsy depending on whether the findings are felt to be from 1 or multiple primary sources.  For now, it is okay for him to ambulate and participate in physical and Occupational Therapy.  I would not recommend urgent surgical intervention at this time.    I have  communicated my recommendations to the requesting physician and coordinated care to facilitate these recommendations.     Alejandria Wessells K. Myer Haff MD, [facility] Neurosurgery

## 2023-04-01 NOTE — Progress Notes (Signed)
 [facility] Progress Note  Patient: Cameron Howe    ZOX:[id number]  DOA: 03/31/2023     Date of Service: the patient was seen and examined on 04/01/2023  Chief Complaint  Patient presents with   Leg Pain    Chronic sciatica    Brief hospital course: Cameron Howe is a 78 y.o. male with medical history significant for osteoarthritis, COPD, type 2 diabetes mellitus, essential, hypertension and tobacco abuse, who presented to the emergency room with acute onset of right-sided sciatica-like pain that has been significantly worsening lately giving him trouble with ambulation.  He was able to get from bed to wheelchair and currently is not able to ambulate or sit in his wheelchair.  He is not able to perform his ADLs secondarily.  His pain has been going on over the last 6 years.  No fever or chills.  No nausea or vomiting or abdominal pain.  No chest pain or palpitations.  No cough or wheezing or dyspnea.  No other paresthesias or focal muscle weakness.   ED Course: When the patient  came to the ER, BP was 172/75 with otherwise normal vital signs.  Labs revealed albumin of 2.9 with otherwise unremarkable CMP.  CBC was within normal. 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 *3.8 x 2.7 cm in maximum diameter bone lesion, hyperintense T2 weighted images mass which appears to be projecting within the posteromedial aspect of the left gluteus muscle against the adjacent iliac posterosuperior spine there is no enhancement after the intravenous administration of contrast material. This is a new lesion since prior examination. Significance is unclear, perhaps ultrasound-guided aspiration may provide additional diagnostic information. If lesion can not be seen on ultrasound consider CT-guided biopsy.   The patient was  given 650 mg p.o. Tylenol.  He will be admitted to a medical bed for further evaluation and management.   Assessment and Plan:  # Acute on chronic low back pain This is associated with right-sided sciatica. S/p 1 dose of Decadron for now. Continue pain management prn Neurosurgery consulted, recommended symptomatic treatment, okay to work with PT and OT, no urgent surgical intervention.  Patient needs workup for malignancy.  # RUL Lung Mass Pt was following at [facility] and he was diagnosed with lung mass in October 2024 but he did not pursue for any workup and continued smoking. CT Chest: Large lobular mass in the right upper lobe measuring 6 x 4.3 x 4.8 cm inseparable from the adjacent mediastinum and suprahilar region with postobstructive pneumonia pneumonitis and bronchiectasis.  Findings are highly suspicious for a bronchogenic carcinoma. *Mediastinal adenopathy. *Small right pleural effusion. *8 mm nodule in the left lower lobe anterior segment. Discussed with pulmonologist, patient needs PET scan as an outpatient and follow-up with oncologist.   # Left Gluteal Mass MRI L-spine: mass which appears to be projecting within the posteromedial aspect of the left gluteus muscle against the adjacent iliac posterosuperior spine  Patient is to follow-up with oncologist, may need biopsy.    # Type 2 diabetes mellitus with peripheral neuropathy (HCC) - The patient will be placed on supplemental coverage with NovoLog. - We will continue Neurontin as mentioned above.   # Peripheral neuropathy associated with right-sided sciatica. continue Neurontin.   # Dyslipidemia continue statin therapy.   # Essential hypertension continue antihypertensive therapy.  There is no height or weight on file to calculate BMI.  Interventions:  Diet: Heart healthy/carb modified diet DVT Prophylaxis: Subcutaneous Lovenox   Advance goals of care discussion: DNR-limited  Family Communication: family was  not present at bedside, at the time of interview.  The pt provided permission to discuss medical plan with the family. Opportunity was given to ask question and all questions were answered satisfactorily.   Disposition:  Pt is from Home, admitted with Back pain and Lung mass, still has , which back pain precludes a safe discharge. Discharge to Home, when stable in 1-2 days.  Subjective: No significant events overnight, patient is having back pain which is controlled with pain medications.  Denied any chest pain or palpitations, no worsening of shortness of breath.  Physical Exam: General: NAD, lying comfortably Appear in no distress, affect appropriate Eyes: PERRLA ENT: Oral Mucosa Clear, moist  Neck: no JVD,  Cardiovascular: S1 and S2 Present, no Murmur,  Respiratory: good respiratory effort, Bilateral Air entry equal and Decreased, no Crackles, no wheezes Abdomen: Bowel Sound present, Soft and no tenderness,  Skin: no rashes Extremities: no Pedal edema, no calf tenderness Neurologic: without any new focal findings Gait not checked due to patient safety concerns  Vitals:   03/31/23 1520 03/31/23 2233 03/31/23 2248 04/01/23 0805  BP: (!) 172/75  (!) 144/66 (!) 154/74  Pulse: 83 82 63 89  Resp: 17 15 20 18   Temp: 98 F (36.7 C)  98.4 F (36.9 C) 97.8 F (36.6 C)  TempSrc: Oral  Oral Oral  SpO2: 95% 92% 96% 95%    Intake/Output Summary (Last 24 hours) at 04/01/2023 0853 Last data filed at 04/01/2023 1610 Gross per 24 hour  Intake 25 ml  Output --  Net 25 ml   There were no vitals filed for this visit.  Data Reviewed: I have personally reviewed and interpreted daily labs, tele strips, imagings as discussed above. I reviewed all nursing notes, pharmacy notes, vitals, pertinent old records I have discussed plan of care as described above with RN and patient/family.  CBC: Recent Labs  Lab 03/31/23 1940 04/01/23 0525  WBC 7.0 5.8  HGB 13.9 13.4  HCT 45.4 43.0  MCV  87.0 84.8  PLT 345 361   Basic Metabolic Panel: Recent Labs  Lab 03/31/23 1940 04/01/23 0525  NA 139 140  K 4.4 4.2  CL 105 105  CO2 26 26  GLUCOSE 103* 135*  BUN 17 18  CREATININE 0.89 0.88  CALCIUM 8.8* 9.1    Studies: No results found.  Scheduled Meds:  amLODipine  10 mg Oral Daily   enoxaparin (LOVENOX) injection  40 mg Subcutaneous Q24H   hydrochlorothiazide  12.5 mg Oral Daily   lidocaine  1 patch Transdermal Q24H   losartan  100 mg Oral Daily   Continuous Infusions:  sodium chloride 40 mL/hr at 03/31/23 2339   PRN Meds: acetaminophen **OR** acetaminophen, magnesium hydroxide, morphine injection, ondansetron **OR** ondansetron (ZOFRAN) IV, traZODone  Time spent: 55 minutes  Author: Gillis Santa. MD [facility] 04/01/2023 8:53 AM  To reach On-call, see care teams to locate the attending and reach out to them via [email or web address]. If 7PM-7AM, please contact night-coverage If you still have difficulty reaching the attending provider, please page the [facility] (Director on Call) for [facility] on amion for assistance.

## 2023-04-01 NOTE — Consult Note (Signed)
 NAME:  Cameron Howe, MRN:  [id number], DOB:  1945/06/02, LOS: 1 ADMISSION DATE:  03/31/2023, CONSULTATION DATE:  04/01/2023 REFERRING MD: Cameron Santa, MD, CHIEF COMPLAINT: Lung mass noted on CT  History of Present Illness:  Cameron Howe is a 78 year old current smoker who presented to [facility] on 31 March 2023 with complaint of right-sided leg pain.  There was incidental finding of a right upper lobe mass and pulmonary is consulted for determination as to the next steps to evaluate this mass.  Of note he has had a history of colon cancer diagnosed in 2012 status post chemotherapy and XRT.  Subsequently discharged from oncology.    I have reviewed his records through Care Everywhere, of note he has had a right lung mass noted since October 2024 when he was admitted at [facility].  At that time he was admitted with sepsis, pneumonia, right hydropneumothorax.  He had thoracentesis performed that was consistent with exudate however no malignancy was identified though there were atypical cells.  Follow-up CT chest to hydropneumothorax that resolved showed persistent of a masslike opacity on the right upper lobe and borderline mediastinal adenopathy.  He had follow-up with pulmonary at [facility] on 23 December 2022.  The patient did not want to undergo further workup for the mass at that time and preferred to be on observational protocol.  The [facility] interventional team also thought patient to be higher risk for procedure.  It was agreed to have follow-up CT in January.  This was apparently not done however the patient had follow-up with his primary physician, Cameron Howe, in February who ordered a repeat CT chest for follow-up with this right lung mass, MRI of the lumbar spine was also obtained.  MRI of the spine was performed on 20 March 2023, not read until yesterday.  CT of the chest was performed on 17 March 2023 likewise not read until yesterday.  The findings of the CT chest and MRI have been reviewed  in detail concern particularly on the MRI of the lumbar spine is the finding of a 3.8 x 2.7 cm bone lesion which appears to be new from his prior examination.  CT of the chest shows mediastinal adenopathy and a large lobular mass in the right upper lobe measuring 6 x 4.3 x 4.8 cm.  There is also significant emphysema.  There is also a small right pleural effusion and a stable 8 mm nodule in the left lower lobe anterior segment previously noted in the [facility] CTs.  On my evaluation of the patient today he continues to want to defer workup of the mass like density in his chest.  He states that his main concern is his issues with inability to walk due to pain in his right leg and buttock.  To be evaluated by neurosurgery.  He does not endorse any respiratory symptoms however appears to use accessory use of respiration which appears to be on a chronic fashion.  He recalls his evaluation by pulmonary at [facility].  He is still not wishing to proceed with workup of his pulmonary mass at present.  Does not endorse any recent weight loss or anorexia.  No fevers chills or sweats.  No cough, no hemoptysis.  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  He is somewhat crossed with regards to being in the hospital and not being told what is planned to help his leg pain.  No pulmonary concerns at present.   Objective   Blood pressure (!) 154/74, pulse 89, temperature 97.8 F (36.6 C),  temperature source Oral, resp. rate 18, SpO2 95%.        Intake/Output Summary (Last 24 hours) at 04/01/2023 1040 Last data filed at 04/01/2023 1027 Gross per 24 hour  Intake 25 ml  Output --  Net 25 ml   SpO2: 95 %   Examination: GENERAL: Thin elderly male, laying in bed, peers uncomfortable due to pain.  Chronic use of accessories of respiration HEAD: Normocephalic, atraumatic.  EYES: Pupils equal, round, reactive to light.  No scleral icterus.  MOUTH: Poor dentition, oral mucosa moist, no thrush. NECK: Supple. No thyromegaly. Trachea midline. No JVD.  No adenopathy. PULMONARY: Diminished breath sounds on the right upper lung zone.  Coarse, otherwise no adventitious sounds. CARDIOVASCULAR: S1 and S2. Regular rate and rhythm.  No rubs, murmurs or gallops heard. ABDOMEN: Scaphoid, soft, right inguinal hernia noted. MUSCULOSKELETAL: No joint deformity, no clubbing, no edema.  NEUROLOGIC: Gait not tested.  Patient is awake and alert, fully oriented, no overt neurodeficit noted.  Speech is fluent. SKIN: Intact,warm,dry. PSYCH: Mood and behavior normal.   Imaging reviewed   Representative images from chest CT performed 21 March 2023:  Significant volume loss on the right lung   Masslike opacity on the right upper lobe significant emphysema noted   Masslike opacity in the right upper lobe   Representative image from MRI obtained 20 March 2023 showing what appears to be a bone lesion     Assessment & Plan:  Lung mass right upper lobe Mediastinal adenopathy Prior history of colorectal cancer Highly suspicious for malignancy Appears to have been present since 10/2022 Will need to obtain [facility] CTs for comparison Needs PET/CT at minimum, this will need to be obtained as outpatient PET/CT will be necessary to better determine areas to biopsy Obtain CEA Obtain CT abdomen pelvis, appears to have been ordered  Apparent bone mass noted on MRI of lumbar spine Consider IR  evaluation for biopsy  COPD on the basis of emphysema Suspect advanced on clinical impression Will need PFTs as outpatient Trial of Anoro 1 inhalation daily  Tobacco dependence due to cigarettes Smoking cessation counseling  History of stage IIIc invasive adenocarcinoma of the rectosigmoid Consider re-consulting oncology as outpatient given current findings  RESULTS/LABS  CBC: Recent Labs  Lab 03/31/23 1940 04/01/23 0525  WBC 7.0 5.8  HGB 13.9 13.4  HCT 45.4 43.0  MCV 87.0 84.8  PLT 345 361    Basic Metabolic Panel: Recent Labs  Lab 03/31/23 1940 04/01/23 0525  NA 139 140  K 4.4 4.2  CL 105 105  CO2 26 26  GLUCOSE 103* 135*  BUN 17 18  CREATININE 0.89 0.88  CALCIUM 8.8* 9.1   GFR: CrCl cannot be calculated (Unknown ideal weight.). Recent Labs  Lab 03/31/23 1940 04/01/23 0525  WBC 7.0 5.8    Liver Function Tests: Recent Labs  Lab 03/31/23 1940  AST 24  ALT 15  ALKPHOS 93  BILITOT 0.7  PROT 7.0  ALBUMIN 2.9*  Review of Systems:   A 10 point review of systems was performed and it is as noted above otherwise negative.  Past Medical History:  He,  has a past medical history of Arthritis, Cancer (HCC), COPD (chronic obstructive pulmonary disease) (HCC), Dental bridge present, Diabetes mellitus without complication (HCC), Elevated PSA, Hypertension, Neuromuscular disorder (HCC), and Tobacco abuse.   Surgical History:   Past Surgical History:  Procedure Laterality Date   CERVICAL DISCECTOMY     CERVICAL FUSION     COLON SURGERY  NOV 2012   COLON REMOVED   COLON SURGERY  APRIL 2014   OSTOMY REMOVED   COLONOSCOPY N/A 04/21/2015   Procedure: COLONOSCOPY;  Surgeon: Wallace Cullens, MD;  Location: [facility];  Service: Gastroenterology;  Laterality: N/A;  DIABETIC, DIET CONTROLLED   COLONOSCOPY WITH PROPOFOL N/A 11/07/2018   Procedure: COLONOSCOPY WITH PROPOFOL;  Surgeon: Earline Mayotte, MD;  Location: [facility];  Service: Endoscopy;   Laterality: N/A;   COLOSTOMY REVISION     hx of colostomy     LAMINECTOMY     POLYPECTOMY N/A 04/21/2015   Procedure: POLYPECTOMY;  Surgeon: Wallace Cullens, MD;  Location: [facility];  Service: Gastroenterology;  Laterality: N/A;   TUNNELED VENOUS PORT PLACEMENT       Social History:   Social History   Tobacco Use   Smoking status: Every Day    Current packs/day: 0.50    Average packs/day: 2 packs/day for +/-30.0 years (56 ttl pk-yrs)    Types: Cigarettes   Smokeless tobacco: Never  Substance Use Topics   Alcohol use: No    Alcohol/week: 0.0 standard drinks of alcohol   Family History:  His family history is not on file.   Allergies No Known Allergies   Home Medications  Prior to Admission medications   Medication Sig Start Date End Date Taking? Authorizing Provider  acetaminophen (TYLENOL) 650 MG CR tablet Take 650 mg by mouth every 8 (eight) hours as needed for pain.    [provider]  amLODipine (NORVASC) 10 MG tablet Take 10 mg by mouth daily.    [provider]  aspirin 81 MG tablet Take 81 mg by mouth daily. 800AM    [provider]  hydrochlorothiazide (HYDRODIURIL) 12.5 MG tablet Take 12.5 mg by mouth daily. 01/26/16   [provider]  Ibuprofen 200 MG CAPS Take by mouth every 6 (six) hours as needed.    [provider]  lidocaine (LIDODERM) 5 % Place 1 patch onto the skin daily. Remove & Discard patch within 12 hours or as directed by MD 03/16/23   Irean Hong, MD  losartan (COZAAR) 100 MG tablet Take 100 mg by mouth daily.    [provider]    Scheduled Meds:  amLODipine  10 mg Oral Daily   enoxaparin (LOVENOX) injection  40 mg Subcutaneous Q24H   hydrochlorothiazide  12.5 mg Oral Daily   lidocaine  1 patch Transdermal Q24H   losartan  100 mg Oral Daily   Continuous Infusions:  sodium chloride 40 mL/hr at 03/31/23 2339   PRN Meds:.acetaminophen **OR** acetaminophen, magnesium hydroxide, morphine  injection, ondansetron **OR** ondansetron (ZOFRAN) IV, traZODone   Level 4 Consult    I spent 62 minutes of dedicated to the care of this patient on the date of this encounter to include pre-visit review of records, face-to-face time with the patient discussing conditions above, post visit ordering of testing, clinical documentation with the electronic health record,  making appropriate referrals as documented, and communicating necessary findings to members of the patients care team.   C. Danice Goltz, MD Advanced Bronchoscopy PCCM [facility]    *This note was generated using voice recognition software/Dragon and/or AI transcription program.  Despite best efforts to proofread, errors can occur which can change the meaning. Any transcriptional errors that result from this process are unintentional and may not be fully corrected at the time of dictation.

## 2023-04-01 NOTE — Plan of Care (Signed)

## 2023-04-02 DIAGNOSIS — R918 Other nonspecific abnormal finding of lung field: Secondary | ICD-10-CM | POA: Diagnosis not present

## 2023-04-02 DIAGNOSIS — G8929 Other chronic pain: Secondary | ICD-10-CM | POA: Diagnosis not present

## 2023-04-02 DIAGNOSIS — R590 Localized enlarged lymph nodes: Secondary | ICD-10-CM | POA: Diagnosis not present

## 2023-04-02 DIAGNOSIS — Z515 Encounter for palliative care: Secondary | ICD-10-CM | POA: Diagnosis not present

## 2023-04-02 DIAGNOSIS — M545 Low back pain, unspecified: Secondary | ICD-10-CM | POA: Diagnosis not present

## 2023-04-02 LAB — BASIC METABOLIC PANEL
Anion gap: 7 (ref 5–15)
BUN: 24 mg/dL — ABNORMAL HIGH (ref 8–23)
CO2: 28 mmol/L (ref 22–32)
Calcium: 8.6 mg/dL — ABNORMAL LOW (ref 8.9–10.3)
Chloride: 103 mmol/L (ref 98–111)
Creatinine, Ser: 0.95 mg/dL (ref 0.61–1.24)
GFR, Estimated: >60 mL/min (ref >60)
Glucose, Bld: 123 mg/dL — ABNORMAL HIGH (ref 70–99)
Potassium: 3.2 mmol/L — ABNORMAL LOW (ref 3.5–5.1)
Sodium: 138 mmol/L (ref 135–145)

## 2023-04-02 LAB — CBC
HCT: 42.4 % (ref 39.0–52.0)
Hemoglobin: 13.3 g/dL (ref 13.0–17.0)
MCH: 26.5 pg (ref 26.0–34.0)
MCHC: 31.4 g/dL (ref 30.0–36.0)
MCV: 84.5 fL (ref 80.0–100.0)
Platelets: 330 10*3/uL (ref 150–400)
RBC: 5.02 MIL/uL (ref 4.22–5.81)
RDW: 16.9 % — ABNORMAL HIGH (ref 11.5–15.5)
WBC: 9.3 10*3/uL (ref 4.0–10.5)
nRBC: 0 % (ref 0.0–0.2)

## 2023-04-02 LAB — URINALYSIS, W/ REFLEX TO CULTURE (INFECTION SUSPECTED)
Bacteria, UA: NONE SEEN
Bilirubin Urine: NEGATIVE
Glucose, UA: NEGATIVE mg/dL
Ketones, ur: NEGATIVE mg/dL
Leukocytes,Ua: NEGATIVE
Nitrite: NEGATIVE
Protein, ur: 30 mg/dL — AB
RBC / HPF: >50 RBC/hpf (ref 0–5)
Specific Gravity, Urine: 1.023 (ref 1.005–1.030)
pH: 6 (ref 5.0–8.0)

## 2023-04-02 LAB — PHOSPHORUS: Phosphorus: 3.2 mg/dL (ref 2.5–4.6)

## 2023-04-02 LAB — MAGNESIUM: Magnesium: 2.2 mg/dL (ref 1.7–2.4)

## 2023-04-02 MED ORDER — FENTANYL 12 MCG/HR TD PT72
1.0000 | MEDICATED_PATCH | TRANSDERMAL | Status: DC
  Administered 2023-04-02 – 2023-04-08 (×3): 1 via TRANSDERMAL
  Filled 2023-04-02 (×4): qty 1

## 2023-04-02 MED ORDER — MORPHINE SULFATE (PF) 2 MG/ML IV SOLN
2.0000 mg | INTRAVENOUS | Status: DC | PRN
  Administered 2023-04-02 – 2023-04-11 (×16): 2 mg via INTRAVENOUS
  Filled 2023-04-02 (×17): qty 1

## 2023-04-02 MED ORDER — POTASSIUM CHLORIDE CRYS ER 20 MEQ PO TBCR
40.0000 meq | EXTENDED_RELEASE_TABLET | ORAL | Status: AC
  Administered 2023-04-02 (×2): 40 meq via ORAL
  Filled 2023-04-02 (×2): qty 2

## 2023-04-02 NOTE — Consult Note (Signed)
 Consultation Note Date: 04/02/2023   Patient Name: Cameron Howe  DOB: Dec 16, 1945  MRN: [id number]  Age / Sex: 78 y.o., male  PCP: Cameron Reichmann, MD Referring Physician: Gillis Santa, MD  Reason for Consultation: Establishing goals of care   HPI/Brief Hospital Course: 78 y.o. 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 Has suffered with chronic back pain for several years, pain has significantly worsened over the last several weeks which is now affecting his mobility and ambulation.   Recently seen in ED 3/6 for mechanical fall-discharged home  Imaging ordered by PCP 3/10 and 3/11 revealing MRI lumbar spine IMPRESSION: *Multilevel degenerative disc disease with spondylitic disc herniations and central canal stenosis as described above. *3.8 x 2.7 cm in maximum diameter bone lesion, hyperintense T2 weighted images mass which appears to be projecting within the posteromedial aspect of the left gluteus muscle against the adjacent iliac posterosuperior spine there is no enhancement after the intravenous administration of contrast material. This is a new lesion since prior examination. Significance is unclear, perhaps ultrasound-guided aspiration may provide additional diagnostic information. If lesion can not be seen on ultrasound consider CT-guided biopsy.  CT chest IMPRESSION: *Large lobular mass in the right upper lobe measuring 6 x 4.3 x 4.8 cm inseparable from the adjacent mediastinum and suprahilar region with postobstructive pneumonia pneumonitis and bronchiectasis. Findings are highly suspicious for a bronchogenic carcinoma. *Mediastinal adenopathy. *Small right pleural effusion. *8 mm nodule in the left lower lobe anterior segment.  CT abdomen  ordered 3/22 IMPRESSION: 1. Lobular right pleural thickening at the lung base compatible with exudative or malignant pleural effusion. Enlarged lymph nodes just outside of the pericardium in the chest partially confluent with the pleural rind. 2. Mild nodularity of the lateral limb right adrenal gland, 1.0 cm in diameter, not present on 09/25/2018, early metastatic lesion not excluded. 3. Nonspecific 8 mm hypodense lesion in the lateral segment left hepatic lobe, not present on 09/25/2018. Likely benign but technically nonspecific. 4. Lytic lesion of the right anterior intertrochanteric portion of the hip, about 1.4 cm in diameter, likely metastatic. This was also observed on the dedicated CT of the right hip from 03/16/2023. 5. Within the upper margin of the left gluteus medius, a 6.0 by 4.2 by 5.8 cm (volume = 77 cc) lesion with enhancing margins is present also abutting the posterior margin of the left iliac bone. There is demineralization of the adjacent iliac bone with some associated cortical irregularity and underlying lucency. Possibilities include malignancy with bony and muscular involvement, versus a gluteal abscess with early adjacent osteomyelitis. There is also some abnormal thickening of the adjacent upper iliacus muscle raising the possibility of some extension through the iliac bone to involve the iliacus muscle. 6. Direct right inguinal hernia contains the cecum and terminal ileum. No findings of obstruction or strangulation at this time. 7. Prostatomegaly. 8. Low-grade edema in the mesentery and omentum with trace edema along the right paracolic gutter. Nonspecific presacral edema. 9. Lumbar spondylosis and degenerative disc disease causing multilevel impingement. 10.  Aortic Atherosclerosis (ICD10-I70.0).  Awaiting referral to Dr. Orlie Howe Monday AM for further recommendations-followed by Dr. Orlie Howe, last seen in 2021 for colon cancer  Palliative medicine was  consulted for assisting with goals of care conversations.  Subjective:  Extensive chart review has been completed prior to meeting patient including labs,  vital signs, imaging, progress notes, orders, and available advanced directive documents from current and previous encounters.  Visited with Cameron Howe at his bedside. He is resting in bed, acknowledges my presence in room, oriented and able to engage in conversation. Was just administered nausea medications which were starting to relive his symptoms. Sister and brother in law at bedside during time of visit. Cameron Howe provided consent to discuss medical care in front of family.  Introduced myself as a Publishing rights manager as a member of the palliative care team. Explained palliative medicine is specialized medical care for people living with serious illness. It focuses on providing relief from the symptoms and stress of a serious illness. The goal is to improve quality of life for both the patient and the family.   Cameron Howe shares a brief life review. He lives at home alone, his sister and brother in law live next door. He is widowed and does not have any children. He was functioning independently up until recently and has had difficulty ambulating and completing ADL's due to significant hip/back pain.  He shares he was made aware of lung mass findings in 2024 on CT scan but did not pursue follow up  Cameron Howe and family able to share their understanding of information that has been provided to them. Requested to review imaging results.  Aware plan is to have Dr. Orlie Howe provide recommendations Monday, will have conversations regarding next steps, prognosis and possible treatment options  Symptoms assessed. Cameron Howe feels nausea has improved with medications. He continues to complain of bilateral hip and lower back pain. Shares it is currently a 7 out of 10 lying in bed, with movement it increases to at least a 10. He does not feel he is getting  much relief from pain medication. We discussed Fentanyl patch and utilizing oxycodone and morphine for breakthrough pain based on severity for which Cameron Howe and family in agreement with. Discussed goal for pain management to reach a pain level acceptable, unlikely to completely rid him of pain but also with hopes to improve functional status.  We discussed patient's current illness and what it means in the larger context of patient's on-going co-morbidities. Natural disease trajectory and expectations at EOL were discussed.   We discussed code status and Mr. Holycross confirms DNR/DNI status, family in agreement as well.  Mr. Cotten shares he has not completed Advanced Directives in the past but wishes to appoint his sister and brother in law as surrogate decision makers, explained his sister would be NOK.  Mr. Burks shares he is trying to process all information given to him and taking this as it comes day by day.  We discussed role of palliative care outside of the hospital and that he will likely be followed by palliative provider once established with oncology.  I discussed importance of continued conversations with family/support persons and all members of their medical team regarding overall plan of care and treatment options ensuring decisions are in alignment with patients goals of care.  All questions/concerns addressed. Emotional support provided to patient/family/support persons. PMT will continue to follow and support patient as needed.  Objective: Primary Diagnoses: Present on Admission:  Acute on chronic low back pain   Physical Exam Constitutional:      General: He is not in acute distress.    Appearance: He is ill-appearing.  Pulmonary:     Effort: Pulmonary effort is normal. No respiratory distress.  Skin:    General: Skin is warm and  dry.  Neurological:     Mental Status: He is alert and oriented to person, place, and time.     Motor: Weakness present.  Psychiatric:         Mood and Affect: Mood normal.        Thought Content: Thought content normal.     Vital Signs: BP (!) 147/75 (BP Location: Left Arm)   Pulse 88   Temp 97.7 F (36.5 C)   Resp 14   SpO2 96%  Pain Scale: 0-10 POSS *See Group Information*: 1-Acceptable,Awake and alert Pain Score: 0-No pain   IO: Intake/output summary:  Intake/Output Summary (Last 24 hours) at 04/02/2023 1520 Last data filed at 04/02/2023 0400 Gross per 24 hour  Intake 765.77 ml  Output 126 ml  Net 639.77 ml    LBM: Last BM Date : 03/31/23 Baseline Weight:   Most recent weight:        Assessment and Plan  SUMMARY OF RECOMMENDATIONS   Fentanyl patch with oxycodone and morphine for breakthrough pain, titrate dosing as needed based on pain response and use of meds for breakthrough pain May benefit from steroid dosing Address ongoing symptoms as they arise  Palliative Prophylaxis:   Bowel Regimen, Delirium Protocol and Frequent Pain Assessment  Discussed With: Primary team   Thank you for this consult and allowing Palliative Medicine to participate in the care of Marijean Niemann. Palliative medicine will continue to follow and assist as needed.   Time Total: 75 minutes  Time spent includes: Detailed review of medical records (labs, imaging, vital signs), medically appropriate exam (mental status, respiratory, cardiac, skin), discussed with treatment team, counseling and educating patient, family and staff, documenting clinical information, medication management and coordination of care.   Signed by: Leeanne Deed, DNP, AGNP-C Palliative Medicine    Please contact Palliative Medicine Team phone at [phone] for questions and concerns.  For individual provider: See Loretha Stapler

## 2023-04-02 NOTE — Progress Notes (Signed)
 PT Cancellation Note  Patient Details Name: Cameron Howe MRN: [id number] DOB: August 30, 1945   Cancelled Treatment:    Reason Eval/Treat Not Completed: Other (comment) (Patient actively throwing up. Requests PT to return at later time/date due to nausea. Will attempt again later as available/appropriate.)  Precious Bard, PT, DPT Physical Therapist - [place]   04/02/2023, 10:59 AM

## 2023-04-02 NOTE — Progress Notes (Signed)
 [facility] Progress Note  Patient: Cameron Howe    JXB:[id number]  DOA: 03/31/2023     Date of Service: the patient was seen and examined on 04/02/2023  Chief Complaint  Patient presents with   Leg Pain    Chronic sciatica    Brief hospital course: Cameron Howe is a 78 y.o. male with medical history significant for osteoarthritis, COPD, type 2 diabetes mellitus, essential, hypertension and tobacco abuse, who presented to the emergency room with acute onset of right-sided sciatica-like pain that has been significantly worsening lately giving him trouble with ambulation.  He was able to get from bed to wheelchair and currently is not able to ambulate or sit in his wheelchair.  He is not able to perform his ADLs secondarily.  His pain has been going on over the last 6 years.  No fever or chills.  No nausea or vomiting or abdominal pain.  No chest pain or palpitations.  No cough or wheezing or dyspnea.  No other paresthesias or focal muscle weakness.   ED Course: When the patient  came to the ER, BP was 172/75 with otherwise normal vital signs.  Labs revealed albumin of 2.9 with otherwise unremarkable CMP.  CBC was within normal. 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 *3.8 x 2.7 cm in maximum diameter bone lesion, hyperintense T2 weighted images mass which appears to be projecting within the posteromedial aspect of the left gluteus muscle against the adjacent iliac posterosuperior spine there is no enhancement after the intravenous administration of contrast material. This is a new lesion since prior examination. Significance is unclear, perhaps ultrasound-guided aspiration may provide additional diagnostic information. If lesion can not be seen on ultrasound consider CT-guided biopsy.   The patient was  given 650 mg p.o. Tylenol.  He will be admitted to a medical bed for further evaluation and management.   Assessment and Plan:  # Acute on chronic low back pain This is associated with right-sided sciatica. S/p 1 dose of Decadron for now. Continue pain management prn Neurosurgery consulted, recommended symptomatic treatment, okay to work with PT and OT, no urgent surgical intervention.  Patient needs workup for malignancy.  # RUL Lung Mass Pt was following at [facility] and he was diagnosed with lung mass in October 2024 but he did not pursue for any workup and continued smoking. CT Chest: Large lobular mass in the right upper lobe measuring 6 x 4.3 x 4.8 cm inseparable from the adjacent mediastinum and suprahilar region with postobstructive pneumonia pneumonitis and bronchiectasis.  Findings are highly suspicious for a bronchogenic carcinoma. *Mediastinal adenopathy. *Small right pleural effusion. *8 mm nodule in the left lower lobe anterior segment. Discussed with pulmonologist, patient needs PET scan as an outpatient and follow-up with oncologist.   # Left Gluteal Mass, left iliac bone and Right femur lesion, most likely metastatic ds MRI L-spine: mass which appears to be projecting within the posteromedial aspect of the left gluteus muscle against the adjacent iliac posterosuperior spine  CT A/p reviewed, multiple metastatic lesions and right inguinal hernia without obstruction Please consult oncologist Dr. Orlie Dakin tomorrow a.m. for further recommendation    # Type 2 diabetes mellitus with peripheral neuropathy (HCC) - The patient will be placed on supplemental coverage with NovoLog. - We will continue Neurontin as mentioned above.   # Peripheral  neuropathy associated with right-sided sciatica. continue Neurontin.   # Dyslipidemia continue statin therapy.   # Essential hypertension continue antihypertensive therapy.   # Hematuria, unknown cause CT A/p shows bilateral renal cyst  and chronic atrophy of left kidney  Follow UA and urine culture Monitor H&H  Goals of care discussion, agreed for DNR/DNI Palliative care consulted for goals of care discussion, overall prognosis is poor.  Patient may need comfort care and hospice down the road.   There is no height or weight on file to calculate BMI.  Interventions:  Diet: Heart healthy/carb modified diet DVT Prophylaxis: Subcutaneous Lovenox   Advance goals of care discussion: DNR-limited  Family Communication: family was not present at bedside, at the time of interview.  The pt provided permission to discuss medical plan with the family. Opportunity was given to ask question and all questions were answered satisfactorily.   Disposition:  Pt is from Home, admitted with Back pain and Lung mass, still workup is pending, which back pain precludes a safe discharge. Discharge to Home, when stable in 1-2 days.  Subjective: No significant events overnight, back pain is 7/10, improved after pain medication.  Patient is having some hematuria, denies any dysuria.  No any other complaints.   Physical Exam: General: NAD, lying comfortably Appear in no distress, affect appropriate Eyes: PERRLA ENT: Oral Mucosa Clear, moist  Neck: no JVD,  Cardiovascular: S1 and S2 Present, no Murmur,  Respiratory: good respiratory effort, Bilateral Air entry equal and Decreased, no Crackles, no wheezes Abdomen: Bowel Sound present, Soft and no tenderness,  Skin: no rashes Extremities: no Pedal edema, no calf tenderness Neurologic: without any new focal findings Gait not checked due to patient safety concerns  Vitals:   04/01/23 1634 04/01/23 1939 04/02/23 0329 04/02/23 0851  BP: (!) 141/107 (!) 154/62 (!) 152/78 (!) 147/75  Pulse: 88 87 78 88  Resp: 18 18 18 14   Temp: 98.3 F (36.8 C) 98.3 F (36.8 C) (!) 97.5 F (36.4 C) 97.7 F (36.5 C)  TempSrc:      SpO2: 92% 91% 100% 96%    Intake/Output Summary (Last 24 hours) at  04/02/2023 1403 Last data filed at 04/02/2023 0400 Gross per 24 hour  Intake 1005.77 ml  Output 126 ml  Net 879.77 ml   There were no vitals filed for this visit.  Data Reviewed: I have personally reviewed and interpreted daily labs, tele strips, imagings as discussed above. I reviewed all nursing notes, pharmacy notes, vitals, pertinent old records I have discussed plan of care as described above with RN and patient/family.  CBC: Recent Labs  Lab 03/31/23 1940 04/01/23 0525 04/02/23 0236  WBC 7.0 5.8 9.3  HGB 13.9 13.4 13.3  HCT 45.4 43.0 42.4  MCV 87.0 84.8 84.5  PLT 345 361 330   Basic Metabolic Panel: Recent Labs  Lab 03/31/23 1940 04/01/23 0525 04/02/23 0236  NA 139 140 138  K 4.4 4.2 3.2*  CL 105 105 103  CO2 26 26 28   GLUCOSE 103* 135* 123*  BUN 17 18 24*  CREATININE 0.89 0.88 0.95  CALCIUM 8.8* 9.1 8.6*  MG  --   --  2.2  PHOS  --   --  3.2    Studies: CT ABDOMEN PELVIS W CONTRAST Result Date: 04/01/2023 CLINICAL DATA:  Right lung mass, metastatic disease/staging workup * Tracking Code: BO * EXAM: CT ABDOMEN AND PELVIS WITH CONTRAST TECHNIQUE: Multidetector CT imaging of the abdomen and pelvis was performed using the  standard protocol following bolus administration of intravenous contrast. RADIATION DOSE REDUCTION: This exam was performed according to the departmental dose-optimization program which includes automated exposure control, adjustment of the mA and/or kV according to patient size and/or use of iterative reconstruction technique. CONTRAST:  75mL OMNIPAQUE IOHEXOL 300 MG/ML  SOLN COMPARISON:  CT chest 03/21/2023 and CT abdomen 09/25/2018 FINDINGS: Lower chest: Lobular right pleural thickening at the lung base compatible with exudative or malignant pleural effusion. Enlarged lymph nodes just outside of the pericardium in the chest partially confluent with the pleural rind. Subcarinal node 2.0 cm in short axis on image 5 series 2. Edema tracks in the soft  tissues of the right chest and right breast. Elevated right hemidiaphragm. Hepatobiliary: Contracted gallbladder. Nonspecific 8 mm hypodense lesion in the lateral segment left hepatic lobe on image 41 series 5, not present on 09/25/2018. Likely benign but technically nonspecific. Pancreas: Unremarkable Spleen: Unremarkable Adrenals/Urinary Tract: Mild nodularity of the lateral limb right adrenal gland, 1.0 cm in diameter on image 33 series 2, not present on 09/25/2018, early metastatic lesion not excluded. Bilateral renal cysts. No further imaging workup of these lesions is indicated. Chronic atrophy of the left kidney lower pole anteriorly. Stomach/Bowel: The stomach is distended. No obvious proximal obstruction, the pyloric channel is patent. Direct right inguinal hernia contains the cecum and terminal ileum. No findings of obstruction or strangulation at this time. Anastomotic staple line the distal sigmoid colon region. Vascular/Lymphatic: Atherosclerosis is present, including aortoiliac atherosclerotic disease. No pathologic adenopathy. Reproductive: Prostatomegaly. Other: Low-grade edema in the mesentery and omentum with trace edema along the right paracolic gutter. Nonspecific presacral edema. Musculoskeletal: Within the upper margin of the left gluteus medius, a 6.0 by 4.2 by 5.8 cm (volume = 77 cm^3) lesion with enhancing margins is present also abutting the posterior margin of the left iliac bone. There is demineralization of the adjacent iliac bone with some associated cortical irregularity and underlying lucency. Possibilities include malignancy with bony and muscular involvement, versus a gluteal abscess with early adjacent osteomyelitis. There is also some abnormal thickening of the adjacent upper iliacus muscle raising the possibility of some extension through the iliac bone to involve the iliacus muscle. Lumbar spondylosis and degenerative disc disease observed causing multilevel impingement. Lytic  lesion of the right anterior inter trochanteric portion of the hip, image 96 series 2, about 1.4 cm in diameter, likely metastatic. This was also observed on the dedicated CT of the right hip from 03/16/2023. IMPRESSION: 1. Lobular right pleural thickening at the lung base compatible with exudative or malignant pleural effusion. Enlarged lymph nodes just outside of the pericardium in the chest partially confluent with the pleural rind. 2. Mild nodularity of the lateral limb right adrenal gland, 1.0 cm in diameter, not present on 09/25/2018, early metastatic lesion not excluded. 3. Nonspecific 8 mm hypodense lesion in the lateral segment left hepatic lobe, not present on 09/25/2018. Likely benign but technically nonspecific. 4. Lytic lesion of the right anterior intertrochanteric portion of the hip, about 1.4 cm in diameter, likely metastatic. This was also observed on the dedicated CT of the right hip from 03/16/2023. 5. Within the upper margin of the left gluteus medius, a 6.0 by 4.2 by 5.8 cm (volume = 77 cc) lesion with enhancing margins is present also abutting the posterior margin of the left iliac bone. There is demineralization of the adjacent iliac bone with some associated cortical irregularity and underlying lucency. Possibilities include malignancy with bony and muscular involvement, versus a gluteal  abscess with early adjacent osteomyelitis. There is also some abnormal thickening of the adjacent upper iliacus muscle raising the possibility of some extension through the iliac bone to involve the iliacus muscle. 6. Direct right inguinal hernia contains the cecum and terminal ileum. No findings of obstruction or strangulation at this time. 7. Prostatomegaly. 8. Low-grade edema in the mesentery and omentum with trace edema along the right paracolic gutter. Nonspecific presacral edema. 9. Lumbar spondylosis and degenerative disc disease causing multilevel impingement. 10.  Aortic Atherosclerosis  (ICD10-I70.0). Electronically Signed   By: Gaylyn Rong M.D.   On: 04/01/2023 18:34    Scheduled Meds:  amLODipine  10 mg Oral Daily   enoxaparin (LOVENOX) injection  40 mg Subcutaneous Q24H   fentaNYL  1 patch Transdermal Q72H   hydrochlorothiazide  12.5 mg Oral Daily   lidocaine  1 patch Transdermal Q24H   losartan  100 mg Oral Daily   potassium chloride  40 mEq Oral Q4H   umeclidinium-vilanterol  1 puff Inhalation Daily   Continuous Infusions:   PRN Meds: acetaminophen **OR** acetaminophen, albuterol, magnesium hydroxide, morphine injection, ondansetron **OR** ondansetron (ZOFRAN) IV, oxyCODONE, traZODone  Time spent: 40 minutes  Author: Gillis Santa. MD [facility] 04/02/2023 2:03 PM  To reach On-call, see care teams to locate the attending and reach out to them via [email or web address]. If 7PM-7AM, please contact night-coverage If you still have difficulty reaching the attending provider, please page the [facility] (Director on Call) for [facility] on amion for assistance.

## 2023-04-02 NOTE — Plan of Care (Signed)

## 2023-04-02 NOTE — Progress Notes (Signed)
 OT Cancellation Note  Patient Details Name: Cameron Howe MRN: [id number] DOB: September 08, 1945   Cancelled Treatment:    Reason Eval/Treat Not Completed: Other (comment) (per discussion with nurse and PT, pt has been nauseous/vomiting, therapist will hold on this date, re-attempt as able.Oleta Mouse, OTD OTR/L  04/02/23, 2:37 PM

## 2023-04-03 DIAGNOSIS — M545 Low back pain, unspecified: Secondary | ICD-10-CM | POA: Diagnosis not present

## 2023-04-03 DIAGNOSIS — Z515 Encounter for palliative care: Secondary | ICD-10-CM

## 2023-04-03 DIAGNOSIS — G8929 Other chronic pain: Secondary | ICD-10-CM | POA: Diagnosis not present

## 2023-04-03 LAB — BASIC METABOLIC PANEL
Anion gap: 6 (ref 5–15)
BUN: 21 mg/dL (ref 8–23)
CO2: 29 mmol/L (ref 22–32)
Calcium: 8.7 mg/dL — ABNORMAL LOW (ref 8.9–10.3)
Chloride: 107 mmol/L (ref 98–111)
Creatinine, Ser: 0.84 mg/dL (ref 0.61–1.24)
GFR, Estimated: >60 mL/min (ref >60)
Glucose, Bld: 112 mg/dL — ABNORMAL HIGH (ref 70–99)
Potassium: 4.7 mmol/L (ref 3.5–5.1)
Sodium: 142 mmol/L (ref 135–145)

## 2023-04-03 LAB — URINE CULTURE: Culture: NO GROWTH

## 2023-04-03 LAB — CBC
HCT: 43.9 % (ref 39.0–52.0)
Hemoglobin: 13.3 g/dL (ref 13.0–17.0)
MCH: 26.1 pg (ref 26.0–34.0)
MCHC: 30.3 g/dL (ref 30.0–36.0)
MCV: 86.2 fL (ref 80.0–100.0)
Platelets: 309 10*3/uL (ref 150–400)
RBC: 5.09 MIL/uL (ref 4.22–5.81)
RDW: 17 % — ABNORMAL HIGH (ref 11.5–15.5)
WBC: 7.4 10*3/uL (ref 4.0–10.5)
nRBC: 0 % (ref 0.0–0.2)

## 2023-04-03 LAB — PHOSPHORUS: Phosphorus: 3.5 mg/dL (ref 2.5–4.6)

## 2023-04-03 LAB — CEA: CEA: 55.4 ng/mL — ABNORMAL HIGH (ref 0.0–4.7)

## 2023-04-03 LAB — MAGNESIUM: Magnesium: 2.4 mg/dL (ref 1.7–2.4)

## 2023-04-03 MED ORDER — SENNA 8.6 MG PO TABS
1.0000 | ORAL_TABLET | Freq: Every day | ORAL | Status: DC
Start: 2023-04-03 — End: 2023-04-06
  Administered 2023-04-03 – 2023-04-06 (×4): 8.6 mg via ORAL
  Filled 2023-04-03 (×4): qty 1

## 2023-04-03 MED ORDER — HYDRALAZINE HCL 50 MG PO TABS: 50.0000 mg | ORAL_TABLET | Freq: Four times a day (QID) | ORAL | Status: DC | PRN

## 2023-04-03 MED ORDER — HYDRALAZINE HCL 20 MG/ML IJ SOLN: 10.0000 mg | Freq: Four times a day (QID) | INTRAMUSCULAR | Status: DC | PRN

## 2023-04-03 MED ORDER — METOPROLOL TARTRATE 25 MG PO TABS
25.0000 mg | ORAL_TABLET | Freq: Two times a day (BID) | ORAL | Status: DC
  Administered 2023-04-03 – 2023-04-11 (×17): 25 mg via ORAL
  Filled 2023-04-03 (×17): qty 1

## 2023-04-03 MED ORDER — POLYETHYLENE GLYCOL 3350 17 G PO PACK
17.0000 g | PACK | Freq: Every day | ORAL | Status: DC
  Administered 2023-04-03 – 2023-04-06 (×4): 17 g via ORAL
  Filled 2023-04-03 (×4): qty 1

## 2023-04-03 NOTE — Progress Notes (Signed)
 OT Cancellation Note  Patient Details Name: Cameron Howe MRN: [id number] DOB: 05-04-1945   Cancelled Treatment:    Reason Eval/Treat Not Completed: Pain limiting ability to participate;Fatigue/lethargy limiting ability to participate. Pt reports fatigue from earlier PT session and states his pain finally feels under control. Pt defers further mobility attempts, agreeable to next date for therapy. Will follow and initiate services as able.   Kathie Dike, M.S. OTR/L  04/03/23, 12:42 PM  ascom [phone]

## 2023-04-03 NOTE — Progress Notes (Signed)
   04/02/23 2200  Vitals  Temp 98.1 F (36.7 C)  Temp Source Oral  BP (!) 146/78  MAP (mmHg) 99  BP Location Left Arm  BP Method Automatic  Pulse Rate (!) 104  Pulse Rate Source Dinamap  Level of Consciousness  Level of Consciousness Alert  MEWS COLOR  MEWS Score Color Green  Oxygen Therapy  SpO2 95 %  O2 Device Nasal Cannula  O2 Flow Rate (L/min) 2 L/min  MEWS Score  MEWS Temp 0  MEWS Systolic 0  MEWS Pulse 1  MEWS RR 0  MEWS LOC 0  MEWS Score 1  Provider Notification  Provider Name/Title Hannah Beat, MD  Date Provider Notified 04/02/23  Time Provider Notified 2200  Method of Notification Page (Secure Chat)  Notification Reason Other (Comment) (DeSAT to 72 on room air, put patient on 2 L [place] now SATs at 95, asked Dr. Arville Care for 24 hours continuous pulse ox to monitor.)  Provider response See new orders (Continuous Pulse ox)  Date of Provider Response 04/02/23  Time of Provider Response 2200

## 2023-04-03 NOTE — Progress Notes (Signed)
 [facility] Progress Note  Patient: Cameron Howe    ZOX:[id number]  DOA: 03/31/2023     Date of Service: the patient was seen and examined on 04/03/2023  Chief Complaint  Patient presents with   Leg Pain    Chronic sciatica    Brief hospital course: Cameron Howe is a 78 y.o. male with medical history significant for osteoarthritis, COPD, type 2 diabetes mellitus, essential, hypertension and tobacco abuse, who presented to the emergency room with acute onset of right-sided sciatica-like pain that has been significantly worsening lately giving him trouble with ambulation.  He was able to get from bed to wheelchair and currently is not able to ambulate or sit in his wheelchair.  He is not able to perform his ADLs secondarily.  His pain has been going on over the last 6 years.  No fever or chills.  No nausea or vomiting or abdominal pain.  No chest pain or palpitations.  No cough or wheezing or dyspnea.  No other paresthesias or focal muscle weakness.   ED Course: When the patient  came to the ER, BP was 172/75 with otherwise normal vital signs.  Labs revealed albumin of 2.9 with otherwise unremarkable CMP.  CBC was within normal. 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 *3.8 x 2.7 cm in maximum diameter bone lesion, hyperintense T2 weighted images mass which appears to be projecting within the posteromedial aspect of the left gluteus muscle against the adjacent iliac posterosuperior spine there is no enhancement after the intravenous administration of contrast material. This is a new lesion since prior examination. Significance is unclear, perhaps ultrasound-guided aspiration may provide additional diagnostic information. If lesion can not be seen on ultrasound consider CT-guided biopsy.   The patient was  given 650 mg p.o. Tylenol.  He will be admitted to a medical bed for further evaluation and management.   Assessment and Plan:  # Acute on chronic low back pain This is associated with right-sided sciatica. S/p 1 dose of Decadron for now. Continue pain management prn Neurosurgery consulted, recommended symptomatic treatment, okay to work with PT and OT, no urgent surgical intervention.  Patient needs workup for malignancy.  # RUL Lung Mass Pt was following at [facility] and he was diagnosed with lung mass in October 2024 but he did not pursue for any workup and continued smoking. CT Chest: Large lobular mass in the right upper lobe measuring 6 x 4.3 x 4.8 cm inseparable from the adjacent mediastinum and suprahilar region with postobstructive pneumonia pneumonitis and bronchiectasis.  Findings are highly suspicious for a bronchogenic carcinoma. *Mediastinal adenopathy. *Small right pleural effusion. *8 mm nodule in the left lower lobe anterior segment. Discussed with pulmonologist, patient needs PET scan as an outpatient and follow-up with oncologist.   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 Orlie Dakin for further recommendation    # Type 2 diabetes mellitus with peripheral neuropathy (HCC) - The patient will be placed on supplemental coverage with NovoLog. - We will continue Neurontin as mentioned above.   # Peripheral neuropathy associated with  right-sided sciatica. continue Neurontin.   # Dyslipidemia continue statin therapy.   # Essential hypertension continue antihypertensive therapy.   # Hematuria, unknown cause CT A/p shows bilateral renal cyst and chronic  atrophy of left kidney  UA shows hematuria. urine culture NGTD Monitor H&H  Goals of care discussion, agreed for DNR/DNI Palliative care consulted for goals of care discussion, overall prognosis is poor.  Patient may need comfort care and hospice down the road.   There is no height or weight on file to calculate BMI.  Interventions:  Diet: Heart healthy/carb modified diet DVT Prophylaxis: Subcutaneous Lovenox   Advance goals of care discussion: DNR-limited  Family Communication: family was not present at bedside, at the time of interview.  The pt provided permission to discuss medical plan with the family. Opportunity was given to ask question and all questions were answered satisfactorily.   Disposition:  Pt is from Home, admitted with Back pain and Lung mass, still workup is pending, which back pain precludes a safe discharge. Discharge to Home, when stable in 1-2 days.  Subjective: No significant events overnight, still has significant backache, patient tried to work with physical therapy, hardly able to stand up. Denied any chest pain or palpitations, no any other complaints. O2 sats drops, currently patient is on oxygen, we will continue to monitor.   Physical Exam: General: NAD, lying comfortably Appear in no distress, affect appropriate Eyes: PERRLA ENT: Oral Mucosa Clear, moist  Neck: no JVD,  Cardiovascular: S1 and S2 Present, no Murmur,  Respiratory: good respiratory effort, Bilateral Air entry equal and Decreased, no Crackles, no wheezes Abdomen: Bowel Sound present, Soft and no tenderness,  Skin: no rashes Extremities: no Pedal edema, no calf tenderness Neurologic: without any new focal findings Gait not checked due to patient safety concerns  Vitals:   04/03/23 0516 04/03/23 0727 04/03/23 1052 04/03/23 1403  BP: (!) 144/60 (!) 144/75 138/61 (!) 156/81  Pulse: 71 71  68  Resp: 16 17  16   Temp: 98.5 F (36.9 C) 98.4 F (36.9 C)  97.7 F (36.5 C)  TempSrc:  Oral Oral  Oral  SpO2: 95% 97%  98%    Intake/Output Summary (Last 24 hours) at 04/03/2023 1406 Last data filed at 04/03/2023 0981 Gross per 24 hour  Intake --  Output 950 ml  Net -950 ml   There were no vitals filed for this visit.  Data Reviewed: I have personally reviewed and interpreted daily labs, tele strips, imagings as discussed above. I reviewed all nursing notes, pharmacy notes, vitals, pertinent old records I have discussed plan of care as described above with RN and patient/family.  CBC: Recent Labs  Lab 03/31/23 1940 04/01/23 0525 04/02/23 0236 04/03/23 0548  WBC 7.0 5.8 9.3 7.4  HGB 13.9 13.4 13.3 13.3  HCT 45.4 43.0 42.4 43.9  MCV 87.0 84.8 84.5 86.2  PLT 345 361 330 309   Basic Metabolic Panel: Recent Labs  Lab 03/31/23 1940 04/01/23 0525 04/02/23 0236 04/03/23 0548  NA 139 140 138 142  K 4.4 4.2 3.2* 4.7  CL 105 105 103 107  CO2 26 26 28 29   GLUCOSE 103* 135* 123* 112*  BUN 17 18 24* 21  CREATININE 0.89 0.88 0.95 0.84  CALCIUM 8.8* 9.1 8.6* 8.7*  MG  --   --  2.2 2.4  PHOS  --   --  3.2 3.5    Studies: No results found.   Scheduled Meds:  enoxaparin (LOVENOX) injection  40 mg  Subcutaneous Q24H   fentaNYL  1 patch Transdermal Q72H   lidocaine  1 patch Transdermal Q24H   metoprolol tartrate  25 mg Oral BID   umeclidinium-vilanterol  1 puff Inhalation Daily   Continuous Infusions:   PRN Meds: acetaminophen **OR** acetaminophen, albuterol, hydrALAZINE **OR** hydrALAZINE, magnesium hydroxide, morphine injection, ondansetron **OR** ondansetron (ZOFRAN) IV, oxyCODONE, traZODone  Time spent: 40 minutes  Author: Gillis Santa. MD [facility] 04/03/2023 2:06 PM  To reach On-call, see care teams to locate the attending and reach out to them via [email or web address]. If 7PM-7AM, please contact night-coverage If you still have difficulty reaching the attending provider, please page the [facility] (Director on Call) for [facility] on amion for  assistance.

## 2023-04-03 NOTE — Progress Notes (Signed)
 Order to "Do not place Purewick" removed (approved by provider) due to increased pain with movement with admitting diagnosis of Acute on chronic low back pain and incontinence.  Purewick in place as plan of care continues.

## 2023-04-03 NOTE — Consult Note (Signed)
 Palliative Medicine [facility] [phone] [phone]   Name: Cameron Howe Date: 04/03/2023 MRN: [id number]  DOB: August 14, 1945  Patient Care Team: Barbette Reichmann, MD as PCP - General (Internal Medicine) Scot Jun, MD (Inactive) (Gastroenterology) Benita Gutter, RN as Oncology Nurse Navigator Glory Buff, RN as Oncology Nurse Navigator    REASON FOR CONSULTATION: Cameron Howe is a 78 y.o. male with multiple medical problems including history of stage IIIc adenocarcinoma of the colon, COPD, DM2, hypertension, tobacco abuse.  Patient has history of a lung mass in October 2024 followed at [facility] but patient opted not to proceed with any further workup or treatment.  Patient was hospitalized 03/31/2023 with acute on chronic right-sided sciatica-like pain.  Here CT of the chest highly suspicious for bronchogenic carcinoma with mediastinal adenopathy and large right upper lobe mass.  Patient also noted to have a left gluteal mass and possible left iliac and right femur lesions concerning for metastatic disease.  Palliative care consulted to address goals and manage ongoing symptoms.  SOCIAL HISTORY:     reports that he has been smoking cigarettes. He has a 15 pack-year smoking history. He has never used smokeless tobacco. He reports that he does not drink alcohol and does not use drugs.  Patient is a widower.  Lives at home alone.  Has a son from whom he is estranged.  His sister and brother-in-law live next-door.  Patient retired as a Agricultural consultant.  ADVANCE DIRECTIVES:  Does not have  CODE STATUS: DNR  PAST MEDICAL HISTORY: Past Medical History:  Diagnosis Date   Arthritis    SHOULDERS AND JOINTS   Cancer [facility])    Colon cancer 11/2010 with parital resection with chemo and rad tx   COPD (chronic obstructive pulmonary disease) (HCC)    Dental bridge present    REMOVABLE   Diabetes mellitus  without complication (HCC)    TYPE 2, DIET CONTROLLED   Elevated PSA    Hypertension    Neuromuscular disorder (HCC)    WEAKNESS AND NUMBNESS HANDS AND FEET, POSSIBLY FROM CHEMO   Tobacco abuse     PAST SURGICAL HISTORY:  Past Surgical History:  Procedure Laterality Date   CERVICAL DISCECTOMY     CERVICAL FUSION     COLON SURGERY  NOV 2012   COLON REMOVED   COLON SURGERY  APRIL 2014   OSTOMY REMOVED   COLONOSCOPY N/A 04/21/2015   Procedure: COLONOSCOPY;  Surgeon: Wallace Cullens, MD;  Location: [facility];  Service: Gastroenterology;  Laterality: N/A;  DIABETIC, DIET CONTROLLED   COLONOSCOPY WITH PROPOFOL N/A 11/07/2018   Procedure: COLONOSCOPY WITH PROPOFOL;  Surgeon: Earline Mayotte, MD;  Location: [facility];  Service: Endoscopy;  Laterality: N/A;   COLOSTOMY REVISION     hx of colostomy     LAMINECTOMY     POLYPECTOMY N/A 04/21/2015   Procedure: POLYPECTOMY;  Surgeon: Wallace Cullens, MD;  Location: [facility];  Service: Gastroenterology;  Laterality: N/A;   TUNNELED VENOUS PORT PLACEMENT      HEMATOLOGY/ONCOLOGY HISTORY:  Oncology History   No history exists.    ALLERGIES:  has no known allergies.  MEDICATIONS:  Current Facility-Administered Medications  Medication Dose Route Frequency Provider Last Rate Last Admin   acetaminophen (TYLENOL) tablet 650 mg  650 mg Oral Q6H PRN Mansy, Jan A, MD       Or   acetaminophen (TYLENOL) suppository 650 mg  650 mg Rectal Q6H PRN Mansy, Vernetta Honey, MD       albuterol (VENTOLIN HFA) 108 (90 Base) MCG/ACT inhaler 2 puff  2 puff Inhalation Q6H PRN Salena Saner, MD       enoxaparin (LOVENOX) injection 40 mg  40 mg Subcutaneous Q24H Mansy, Jan A, MD   40 mg at 04/03/23 0930   fentaNYL (DURAGESIC) 12 MCG/HR 1 patch  1 patch Transdermal Q72H Theotis Burrow, NP   1 patch at 04/02/23 1335   hydrALAZINE (APRESOLINE) tablet 50 mg  50 mg Oral Q6H PRN Gillis Santa, MD       Or   hydrALAZINE (APRESOLINE) injection 10 mg  10  mg Intravenous Q6H PRN Gillis Santa, MD       lidocaine (LIDODERM) 5 % 1 patch  1 patch Transdermal Q24H Mansy, Jan A, MD   1 patch at 04/02/23 2210   magnesium hydroxide (MILK OF MAGNESIA) suspension 30 mL  30 mL Oral Daily PRN Mansy, Jan A, MD       metoprolol tartrate (LOPRESSOR) tablet 25 mg  25 mg Oral BID Gillis Santa, MD   25 mg at 04/03/23 1403   morphine (PF) 2 MG/ML injection 2 mg  2 mg Intravenous Q4H PRN Theotis Burrow, NP   2 mg at 04/03/23 0518   ondansetron (ZOFRAN) tablet 4 mg  4 mg Oral Q6H PRN Mansy, Jan A, MD       Or   ondansetron [facility]) injection 4 mg  4 mg Intravenous Q6H PRN Mansy, Jan A, MD   4 mg at 04/02/23 1429   oxyCODONE (Oxy IR/ROXICODONE) immediate release tablet 5 mg  5 mg Oral Q4H PRN Gillis Santa, MD   5 mg at 04/03/23 1407   traZODone (DESYREL) tablet 25 mg  25 mg Oral QHS PRN Mansy, Jan A, MD       umeclidinium-vilanterol (ANORO ELLIPTA) 62.5-25 MCG/ACT 1 puff  1 puff Inhalation Daily Salena Saner, MD   1 puff at 04/03/23 1610   Facility-Administered Medications Ordered in Other Encounters  Medication Dose Route Frequency Provider Last Rate Last Admin   sodium chloride flush (NS) 0.9 % injection 10 mL  10 mL Intravenous PRN Jeralyn Ruths, MD   10 mL at 02/16/18 0949    VITAL SIGNS: BP (!) 156/81 (BP Location: Left Arm)   Pulse 68   Temp 97.7 F (36.5 C) (Oral)   Resp 16   SpO2 98%  There were no vitals filed for this visit.  Estimated body mass index is 24.37 kg/m as calculated from the following:   Height as of 03/15/23: 5\' 9"  (1.753 m).   Weight as of 03/15/23: 165 lb (74.8 kg).  LABS: CBC:    Component Value Date/Time   WBC 7.4 04/03/2023 0548   HGB 13.3 04/03/2023 0548   HGB 17.6 04/23/2014 0823   HCT 43.9 04/03/2023 0548   HCT 54.1 (H) 04/23/2014 0823   PLT 309 04/03/2023 0548   PLT 160 04/23/2014 0823   MCV 86.2 04/03/2023 0548   MCV 88 04/23/2014 0823   NEUTROABS 1.5 (L) 10/01/2020 1027   NEUTROABS 2.0 04/23/2014  0823   LYMPHSABS 1.3 10/01/2020 1027   LYMPHSABS 1.5 04/23/2014 0823   MONOABS 0.4 10/01/2020 1027   MONOABS 0.4 04/23/2014 0823   EOSABS 0.1 10/01/2020 1027   EOSABS 0.1 04/23/2014 0823   BASOSABS 0.0 10/01/2020 1027   BASOSABS 0.0 04/23/2014 0823   Comprehensive Metabolic Panel:  Component Value Date/Time   NA 142 04/03/2023 0548   NA 138 04/23/2014 0823   K 4.7 04/03/2023 0548   K 4.7 04/23/2014 0823   CL 107 04/03/2023 0548   CL 99 (L) 04/23/2014 0823   CO2 29 04/03/2023 0548   CO2 31 04/23/2014 0823   BUN 21 04/03/2023 0548   BUN 22 (H) 04/23/2014 0823   CREATININE 0.84 04/03/2023 0548   CREATININE 1.39 (H) 04/23/2014 0823   GLUCOSE 112 (H) 04/03/2023 0548   GLUCOSE 117 (H) 04/23/2014 0823   CALCIUM 8.7 (L) 04/03/2023 0548   CALCIUM 9.3 04/23/2014 0823   AST 24 03/31/2023 1940   AST 20 04/23/2014 0823   ALT 15 03/31/2023 1940   ALT 19 04/23/2014 0823   ALKPHOS 93 03/31/2023 1940   ALKPHOS 101 04/23/2014 0823   BILITOT 0.7 03/31/2023 1940   BILITOT 0.4 04/23/2014 0823   PROT 7.0 03/31/2023 1940   PROT 7.4 04/23/2014 0823   ALBUMIN 2.9 (L) 03/31/2023 1940   ALBUMIN 4.1 04/23/2014 0823    RADIOGRAPHIC STUDIES: CT ABDOMEN PELVIS W CONTRAST Result Date: 04/01/2023 CLINICAL DATA:  Right lung mass, metastatic disease/staging workup * Tracking Code: BO * EXAM: CT ABDOMEN AND PELVIS WITH CONTRAST TECHNIQUE: Multidetector CT imaging of the abdomen and pelvis was performed using the standard protocol following bolus administration of intravenous contrast. RADIATION DOSE REDUCTION: This exam was performed according to the departmental dose-optimization program which includes automated exposure control, adjustment of the mA and/or kV according to patient size and/or use of iterative reconstruction technique. CONTRAST:  75mL OMNIPAQUE IOHEXOL 300 MG/ML  SOLN COMPARISON:  CT chest 03/21/2023 and CT abdomen 09/25/2018 FINDINGS: Lower chest: Lobular right pleural thickening at the  lung base compatible with exudative or malignant pleural effusion. Enlarged lymph nodes just outside of the pericardium in the chest partially confluent with the pleural rind. Subcarinal node 2.0 cm in short axis on image 5 series 2. Edema tracks in the soft tissues of the right chest and right breast. Elevated right hemidiaphragm. Hepatobiliary: Contracted gallbladder. Nonspecific 8 mm hypodense lesion in the lateral segment left hepatic lobe on image 41 series 5, not present on 09/25/2018. Likely benign but technically nonspecific. Pancreas: Unremarkable Spleen: Unremarkable Adrenals/Urinary Tract: Mild nodularity of the lateral limb right adrenal gland, 1.0 cm in diameter on image 33 series 2, not present on 09/25/2018, early metastatic lesion not excluded. Bilateral renal cysts. No further imaging workup of these lesions is indicated. Chronic atrophy of the left kidney lower pole anteriorly. Stomach/Bowel: The stomach is distended. No obvious proximal obstruction, the pyloric channel is patent. Direct right inguinal hernia contains the cecum and terminal ileum. No findings of obstruction or strangulation at this time. Anastomotic staple line the distal sigmoid colon region. Vascular/Lymphatic: Atherosclerosis is present, including aortoiliac atherosclerotic disease. No pathologic adenopathy. Reproductive: Prostatomegaly. Other: Low-grade edema in the mesentery and omentum with trace edema along the right paracolic gutter. Nonspecific presacral edema. Musculoskeletal: Within the upper margin of the left gluteus medius, a 6.0 by 4.2 by 5.8 cm (volume = 77 cm^3) lesion with enhancing margins is present also abutting the posterior margin of the left iliac bone. There is demineralization of the adjacent iliac bone with some associated cortical irregularity and underlying lucency. Possibilities include malignancy with bony and muscular involvement, versus a gluteal abscess with early adjacent osteomyelitis. There is  also some abnormal thickening of the adjacent upper iliacus muscle raising the possibility of some extension through the iliac bone to involve the  iliacus muscle. Lumbar spondylosis and degenerative disc disease observed causing multilevel impingement. Lytic lesion of the right anterior inter trochanteric portion of the hip, image 96 series 2, about 1.4 cm in diameter, likely metastatic. This was also observed on the dedicated CT of the right hip from 03/16/2023. IMPRESSION: 1. Lobular right pleural thickening at the lung base compatible with exudative or malignant pleural effusion. Enlarged lymph nodes just outside of the pericardium in the chest partially confluent with the pleural rind. 2. Mild nodularity of the lateral limb right adrenal gland, 1.0 cm in diameter, not present on 09/25/2018, early metastatic lesion not excluded. 3. Nonspecific 8 mm hypodense lesion in the lateral segment left hepatic lobe, not present on 09/25/2018. Likely benign but technically nonspecific. 4. Lytic lesion of the right anterior intertrochanteric portion of the hip, about 1.4 cm in diameter, likely metastatic. This was also observed on the dedicated CT of the right hip from 03/16/2023. 5. Within the upper margin of the left gluteus medius, a 6.0 by 4.2 by 5.8 cm (volume = 77 cc) lesion with enhancing margins is present also abutting the posterior margin of the left iliac bone. There is demineralization of the adjacent iliac bone with some associated cortical irregularity and underlying lucency. Possibilities include malignancy with bony and muscular involvement, versus a gluteal abscess with early adjacent osteomyelitis. There is also some abnormal thickening of the adjacent upper iliacus muscle raising the possibility of some extension through the iliac bone to involve the iliacus muscle. 6. Direct right inguinal hernia contains the cecum and terminal ileum. No findings of obstruction or strangulation at this time. 7.  Prostatomegaly. 8. Low-grade edema in the mesentery and omentum with trace edema along the right paracolic gutter. Nonspecific presacral edema. 9. Lumbar spondylosis and degenerative disc disease causing multilevel impingement. 10.  Aortic Atherosclerosis (ICD10-I70.0). Electronically Signed   By: Gaylyn Rong M.D.   On: 04/01/2023 18:34   MR Lumbar Spine W Wo Contrast Result Date: 03/31/2023 CLINICAL DATA:  Chronic low back pain EXAM: MRI LUMBAR SPINE WITHOUT AND WITH CONTRAST TECHNIQUE: Multiplanar and multiecho pulse sequences of the lumbar spine were obtained without and with intravenous contrast. CONTRAST:  7mL GADAVIST GADOBUTROL 1 MMOL/ML IV SOLN COMPARISON:  September 16, 2022 MRI lumbar spine FINDINGS: Segmentation:  Standard. Alignment:  Physiologic. Vertebrae:  No fracture, evidence of discitis, or bone lesion. Conus medullaris and cauda equina: Conus extends to the T12 level. Conus and cauda equina appear normal. Paraspinal and other soft tissues: Negative. Disc levels: T12 - L1 No disc protrusion. No foraminal stenosis. No central canal stenosis. L1 - L2 narrowing of the disc space with a central, left paracentral spondylitic bulging disc disc herniation bone bridge complex flattening the thecal sac and encroaching particularly the left neural foramina small right lateral bulging disc encroaching the right neural foramina L2 - L3 severe narrowing of the disc space. With a large central spondylitic disc herniation bone bridge complex extending right and left laterally into both neural foramina producing severe central canal stenosis, thecal sac compression aggravated by the hypertrophic osteoarthritic changes of the posterior elements, ligamentum flavum and hypertrophy of the facet joints. These findings are basically unchanged since prior examination. L3 - L4 severe narrowing of the disc space with a central, right and left paracentral spondylitic disc herniation bone bridge complex flattening  the thecal sac and encroaching on both neural foramina, with significant central canal stenosis aggravated by the hypertrophic osteoarthritic changes of the posterior elements similar to prior examination. L4 -  L5 narrowing of the disc space with degenerative disc disease spondylitic central left paracentral disc herniation bone bridge complex flattening the thecal sac and encroaching the left neural foramina aggravated by hypertrophic osteoarthritic changes of the left facet joints and ligamentum flavum. L5 - S1 No disc protrusion. No foraminal stenosis. No central canal stenosis. 3.8 by 2.7 cm in maximum diameter bone lesion,, hyperintense T2 weighted images mass which appears to be projecting within the posteromedial aspect of the left gluteus muscle against the adjacent iliac posterosuperior spine there is no enhancement after the intravenous administration of contrast material. This is a new lesion since prior examination. And on these images there is no clear evidence of bone involvement. Significance is unclear, perhaps ultrasound-guided aspiration may provide additional diagnostic information. If lesion can not be seen on ultrasound consider CT-guided biopsy. No abnormal enhancement after the intravenous administration of contrast material IMPRESSION: *Multilevel degenerative disc disease with spondylitic disc herniations and central canal stenosis as described above. *3.8 x 2.7 cm in maximum diameter bone lesion, hyperintense T2 weighted images mass which appears to be projecting within the posteromedial aspect of the left gluteus muscle against the adjacent iliac posterosuperior spine there is no enhancement after the intravenous administration of contrast material. This is a new lesion since prior examination. Significance is unclear, perhaps ultrasound-guided aspiration may provide additional diagnostic information. If lesion can not be seen on ultrasound consider CT-guided biopsy. Electronically Signed    By: Shaaron Adler M.D.   On: 03/31/2023 10:47   CT CHEST W CONTRAST Result Date: 03/31/2023 CLINICAL DATA:  Mass in the right lung EXAM: CT CHEST WITH CONTRAST TECHNIQUE: Multidetector CT imaging of the chest was performed during intravenous contrast administration. RADIATION DOSE REDUCTION: This exam was performed according to the departmental dose-optimization program which includes automated exposure control, adjustment of the mA and/or kV according to patient size and/or use of iterative reconstruction technique. CONTRAST:  75mL OMNIPAQUE IOHEXOL 300 MG/ML  SOLN COMPARISON:  CT chest September 25, 2018 FINDINGS: Cardiovascular: No significant vascular findings. Normal heart size. No pericardial effusion. Extensive coronary artery calcifications. No pericardial effusions. Mediastinum/Nodes: Retrocaval pretracheal adenopathy measuring 1.9 x 1.6 cm. Pretracheal adenopathy 1.5 cm. Large lobular mass in the right upper lobe measuring 6 by 4.3 by 4.8 cm inseparable from the adjacent mediastinum and suprahilar region with postobstructive pneumonia pneumonitis and bronchiectasis. Thickening of the adjacent fissure with nodularity. Findings are highly suspicious for a bronchogenic carcinoma. Lungs/Pleura: Small right pleural effusion. Postobstructive pneumonia pneumonitis of the right upper lobe as described above. 8 mm nodule in the left lower lobe anterior segment image 83. Upper Abdomen: No acute abnormality. Musculoskeletal: No chest wall abnormality. No acute or significant osseous findings. IMPRESSION: *Large lobular mass in the right upper lobe measuring 6 x 4.3 x 4.8 cm inseparable from the adjacent mediastinum and suprahilar region with postobstructive pneumonia pneumonitis and bronchiectasis. Findings are highly suspicious for a bronchogenic carcinoma. *Mediastinal adenopathy. *Small right pleural effusion. *8 mm nodule in the left lower lobe anterior segment. Electronically Signed   By: Shaaron Adler M.D.    On: 03/31/2023 10:37   CT Hip Right Wo Contrast Result Date: 03/16/2023 CLINICAL DATA:  Patient fell. Thigh pain. Clinical concern for hip fracture. EXAM: CT OF THE RIGHT HIP WITHOUT CONTRAST TECHNIQUE: Multidetector CT imaging of the right hip was performed according to the standard protocol. Multiplanar CT image reconstructions were also generated. RADIATION DOSE REDUCTION: This exam was performed according to the departmental dose-optimization program which includes automated  exposure control, adjustment of the mA and/or kV according to patient size and/or use of iterative reconstruction technique. COMPARISON:  Abdomen and pelvis CT 09/25/2018 FINDINGS: Bones/Joint/Cartilage No right femoral neck fracture. Right superior and inferior pubic rami are intact. Small joint effusion evident. 1.1 x 2.8 x 1.0 cm lucent lesion identified medial cortex of the femoral metaphysis just anterior to the lesser trochanter. This is new in the interval since the 2022 exam. Ligaments Suboptimally assessed by CT. Muscles and Tendons No evidence for intramuscular or subcutaneous hematoma in the right hip region. No substantial subcutaneous edema. Soft tissues Right groin hernia has been incompletely visualized but contains a segment of colon. No overt complicating features within the visualized portion of the hernia. Prostate gland appears enlarged. IMPRESSION: 1. No acute bony findings in the right hip. No evidence for intramuscular or subcutaneous hematoma. 2. 1.1 x 2.8 x 1.0 cm lucent lesion medial cortex of the femoral metaphysis just anterior to the lesser trochanter. This is new in the interval since the 2022 exam. This is nonspecific and while not overtly concerning, lesion warrants additional imaging follow-up. Nonemergent outpatient MRI of the right hip with and without contrast recommended to further evaluate. 3. Right groin hernia has been incompletely visualized but contains a segment of colon. No overt complicating  features within the visualized portion of the hernia. Electronically Signed   By: Kennith Center M.D.   On: 03/16/2023 05:39   DG Femur Min 2 Views Right Result Date: 03/16/2023 CLINICAL DATA:  Status post fall. EXAM: RIGHT FEMUR 2 VIEWS COMPARISON:  None Available. FINDINGS: There is no evidence of fracture or other focal bone lesions. Mild to moderate severity vascular calcification is seen. Soft tissue structures are otherwise unremarkable. IMPRESSION: No acute osseous abnormality. Electronically Signed   By: Aram Candela M.D.   On: 03/16/2023 00:47   DG Ankle Complete Right Result Date: 03/16/2023 CLINICAL DATA:  Status post fall. EXAM: RIGHT ANKLE - COMPLETE 3+ VIEW COMPARISON:  None Available. FINDINGS: There is a small, nondisplaced acute fracture involving the distal tip of the right lateral malleolus. There is no evidence of dislocation. There is no evidence of arthropathy or other focal bone abnormality. Mild diffuse soft tissue swelling is present. IMPRESSION: Small, nondisplaced acute fracture involving the distal tip of the right lateral malleolus. Electronically Signed   By: Aram Candela M.D.   On: 03/16/2023 00:46    PERFORMANCE STATUS (ECOG) : 2 - Symptomatic, <50% confined to bed  Review of Systems Unless otherwise noted, a complete review of systems is negative.  Physical Exam General: NAD Cardiovascular: regular rate and rhythm Pulmonary: clear ant fields Abdomen: soft, nontender, + bowel sounds GU: no suprapubic tenderness Extremities: no edema, no joint deformities Skin: no rashes Neurological: Weakness but otherwise nonfocal  IMPRESSION: Patient admitted with acute on chronic low back pain, which radiates down his right leg.  He received a dose of dexamethasone.  Was started on fentanyl and oxycodone.  Patient reports some improvement in pain.  In past 24 hours, patient has received a total of 6 mg IV morphine and 15 mg oxycodone.  Transdermal fentanyl 12.5 mcg  patch was started yesterday.  Discussed with patient recommendation to maximize utilization of oxycodone to find a regimen that will best control patient's pain in anticipation of discharge.  Can liberalize fentanyl patch or rotate to long-acting oxycodone if needed.  Patient does endorse constipation. Will start daily bowel regimen.  Workup suggestive of an advanced stage lung cancer.  Patient saw Dr. Orlie Dakin earlier today and has agreed to proceed with workup including PET scan and biopsy to be completed outpatient.  At baseline, patient lives at home alone but his sister and brother-in-law live next-door.  Will have TOC coordinate discharge planning.  Patient is a DNR/DNI.  He does not have advance directives but would want his sister to be his healthcare proxy if needed.  He is estranged from his son.  Will consult chaplain to assist with ACP documents.  PLAN: -Continue current scope of treatment -Continue fentanyl/oxycodone.  Maximize use of oxycodone and liberalize fentanyl if needed. -Daily bowel regimen with MiraLAX/senna -Referral to chaplain to assist with ACP documents -TOC to coordinate discharge planning -Will plan [facility] in [facility]  Case and plan discussed with Dr. Orlie Dakin  Patient expressed understanding and was in agreement with this plan. He also understands that He can call the clinic at any time with any questions, concerns, or complaints.     Time Total: 30 minutes  Visit consisted of counseling and education dealing with the complex and emotionally intense issues of symptom management and palliative care in the setting of serious and potentially life-threatening illness.Greater than 50%  of this time was spent counseling and coordinating care related to the above assessment and plan.  Signed by: Laurette Schimke, PhD, NP-C

## 2023-04-03 NOTE — Care Management Important Message (Signed)
 Important Message  Patient Details  Name: Cameron Howe MRN: [id number] Date of Birth: June 13, 1945   Important Message Given:  Yes - Medicare IM     Cristela Blue, CMA 04/03/2023, 12:37 PM

## 2023-04-03 NOTE — Consult Note (Signed)
 [facility]  [phone] [phone]  ID: Cameron Howe OB: 05/23/1945  MR#: [id number]  AOZ#:[id number]  Patient Care Team: Barbette Reichmann, MD as PCP - General (Internal Medicine) Scot Jun, MD (Inactive) (Gastroenterology) Benita Gutter, RN as Oncology Nurse Navigator Glory Buff, RN as Oncology Nurse Navigator  CHIEF COMPLAINT: Lung mass, with possible metastatic disease.  INTERVAL HISTORY: Patient is a 78 year old male with a distant history of stage III colon cancer who recently presented to the emergency room with increasing right sided back and right leg pain.  Patient previously noted to have a right upper lobe lung mass measuring 6.0 x 4.3 x 4.8 cm on CT scan from March 21, 2023.  Mass was also previously seen at [facility] in fall 2024 at which time patient apparently declined biopsy or further workup.  Recent CT scan of the abdomen and pelvis revealed a left gluteus lesion concerning for metastatic lesion or possible abscess.  Patient also noted to have several bony lytic lesions.  He continues to have pain, but this has improved since admission.  He has no neurologic complaints.  He denies any recent fevers or illnesses.  He has a good appetite and denies weight.  He has no chest pain, shortness of breath, cough, or hemoptysis.  He denies any nausea, vomiting, constipation, or diarrhea.  He has no urinary complaints.  Patient offers no further specific complaints today.  REVIEW OF SYSTEMS:   Review of Systems  Constitutional: Negative.  Negative for fever, malaise/fatigue and weight loss.  Respiratory: Negative.  Negative for cough and shortness of breath.   Cardiovascular: Negative.  Negative for chest pain and leg swelling.  Gastrointestinal: Negative.  Negative for abdominal pain.  Genitourinary: Negative.  Negative for dysuria.  Musculoskeletal:  Positive for back pain.  Skin: Negative.  Negative for rash.   Neurological: Negative.  Negative for dizziness, focal weakness, weakness and headaches.  Psychiatric/Behavioral: Negative.  The patient is not nervous/anxious.     As per HPI. Otherwise, a complete review of systems is negative.  PAST MEDICAL HISTORY: Past Medical History:  Diagnosis Date   Arthritis    SHOULDERS AND JOINTS   Cancer [facility])    Colon cancer 11/2010 with parital resection with chemo and rad tx   COPD (chronic obstructive pulmonary disease) (HCC)    Dental bridge present    REMOVABLE   Diabetes mellitus without complication (HCC)    TYPE 2, DIET CONTROLLED   Elevated PSA    Hypertension    Neuromuscular disorder (HCC)    WEAKNESS AND NUMBNESS HANDS AND FEET, POSSIBLY FROM CHEMO   Tobacco abuse     PAST SURGICAL HISTORY: Past Surgical History:  Procedure Laterality Date   CERVICAL DISCECTOMY     CERVICAL FUSION     COLON SURGERY  NOV 2012   COLON REMOVED   COLON SURGERY  APRIL 2014   OSTOMY REMOVED   COLONOSCOPY N/A 04/21/2015   Procedure: COLONOSCOPY;  Surgeon: Wallace Cullens, MD;  Location: [facility];  Service: Gastroenterology;  Laterality: N/A;  DIABETIC, DIET CONTROLLED   COLONOSCOPY WITH PROPOFOL N/A 11/07/2018   Procedure: COLONOSCOPY WITH PROPOFOL;  Surgeon: Earline Mayotte, MD;  Location: [facility] END[facility];  Service: Endoscopy;  Laterality: N/A;   COLOSTOMY REVISION     hx of colostomy     LAMINECTOMY     POLYPECTOMY N/A 04/21/2015   Procedure: POLYPECTOMY;  Surgeon: Wallace Cullens, MD;  Location: [facility];  Service: Gastroenterology;  Laterality: N/A;   TUNNELED VENOUS PORT PLACEMENT      FAMILY HISTORY: History reviewed. No pertinent family history.  ADVANCED DIRECTIVES (Y/N):  @ADVDIR @  HEALTH MAINTENANCE: Social History   Tobacco Use   Smoking status: Every Day    Current packs/day: 0.50    Average packs/day: 0.5 packs/day for 30.0 years (15.0 ttl pk-yrs)    Types: Cigarettes   Smokeless tobacco: Never  Substance Use  Topics   Alcohol use: No    Alcohol/week: 0.0 standard drinks of alcohol   Drug use: No     Colonoscopy:  PAP:  Bone density:  Lipid panel:  No Known Allergies  Current Facility-Administered Medications  Medication Dose Route Frequency Provider Last Rate Last Admin   acetaminophen (TYLENOL) tablet 650 mg  650 mg Oral Q6H PRN Mansy, Jan A, MD       Or   acetaminophen (TYLENOL) suppository 650 mg  650 mg Rectal Q6H PRN Mansy, Jan A, MD       albuterol (VENTOLIN HFA) 108 (90 Base) MCG/ACT inhaler 2 puff  2 puff Inhalation Q6H PRN Salena Saner, MD       enoxaparin (LOVENOX) injection 40 mg  40 mg Subcutaneous Q24H Mansy, Jan A, MD   40 mg at 04/03/23 0930   fentaNYL (DURAGESIC) 12 MCG/HR 1 patch  1 patch Transdermal Q72H Theotis Burrow, NP   1 patch at 04/02/23 1335   hydrALAZINE (APRESOLINE) tablet 50 mg  50 mg Oral Q6H PRN Gillis Santa, MD       Or   hydrALAZINE (APRESOLINE) injection 10 mg  10 mg Intravenous Q6H PRN Gillis Santa, MD       lidocaine (LIDODERM) 5 % 1 patch  1 patch Transdermal Q24H Mansy, Jan A, MD   1 patch at 04/02/23 2210   magnesium hydroxide (MILK OF MAGNESIA) suspension 30 mL  30 mL Oral Daily PRN Mansy, Jan A, MD       metoprolol tartrate (LOPRESSOR) tablet 25 mg  25 mg Oral BID Gillis Santa, MD   25 mg at 04/03/23 1403   morphine (PF) 2 MG/ML injection 2 mg  2 mg Intravenous Q4H PRN Theotis Burrow, NP   2 mg at 04/03/23 0518   ondansetron (ZOFRAN) tablet 4 mg  4 mg Oral Q6H PRN Mansy, Jan A, MD       Or   ondansetron [facility]) injection 4 mg  4 mg Intravenous Q6H PRN Mansy, Jan A, MD   4 mg at 04/02/23 1429   oxyCODONE (Oxy IR/ROXICODONE) immediate release tablet 5 mg  5 mg Oral Q4H PRN Gillis Santa, MD   5 mg at 04/03/23 1407   polyethylene glycol (MIRALAX / GLYCOLAX) packet 17 g  17 g Oral Daily Borders, Daryl Eastern, NP   17 g at 04/03/23 1733   senna (SENOKOT) tablet 8.6 mg  1 tablet Oral Daily Borders, Daryl Eastern, NP   8.6 mg at 04/03/23 1733    traZODone (DESYREL) tablet 25 mg  25 mg Oral QHS PRN Mansy, Jan A, MD       umeclidinium-vilanterol (ANORO ELLIPTA) 62.5-25 MCG/ACT 1 puff  1 puff Inhalation Daily Salena Saner, MD   1 puff at 04/03/23 1610   Facility-Administered Medications Ordered in Other Encounters  Medication Dose Route Frequency Provider Last Rate Last Admin   sodium chloride flush (NS) 0.9 % injection 10 mL  10 mL Intravenous PRN Jeralyn Ruths, MD   10 mL at  02/16/18 0949    OBJECTIVE: Vitals:   04/03/23 1403 04/03/23 1555  BP: (!) 156/81 (!) 145/69  Pulse: 68 (!) 58  Resp: 16 16  Temp: 97.7 F (36.5 C) 99.2 F (37.3 C)  SpO2: 98% 97%     There is no height or weight on file to calculate BMI.    ECOG FS:1 - Symptomatic but completely ambulatory  General: Well-developed, well-nourished, no acute distress. Eyes: Pink conjunctiva, anicteric sclera. HEENT: Normocephalic, moist mucous membranes. Lungs: No audible wheezing or coughing. Heart: Regular rate and rhythm. Abdomen: Soft, nontender, no obvious distention. Musculoskeletal: No edema, cyanosis, or clubbing. Neuro: Alert, answering all questions appropriately. Cranial nerves grossly intact. Skin: No rashes or petechiae noted. Psych: Normal affect. Lymphatics: No cervical, calvicular, axillary or inguinal LAD.   LAB RESULTS:  Lab Results  Component Value Date   NA 142 04/03/2023   K 4.7 04/03/2023   CL 107 04/03/2023   CO2 29 04/03/2023   GLUCOSE 112 (H) 04/03/2023   BUN 21 04/03/2023   CREATININE 0.84 04/03/2023   CALCIUM 8.7 (L) 04/03/2023   PROT 7.0 03/31/2023   ALBUMIN 2.9 (L) 03/31/2023   AST 24 03/31/2023   ALT 15 03/31/2023   ALKPHOS 93 03/31/2023   BILITOT 0.7 03/31/2023   GFRNONAA >60 04/03/2023   GFRAA >60 02/05/2016    Lab Results  Component Value Date   WBC 7.4 04/03/2023   NEUTROABS 1.5 (L) 10/01/2020   HGB 13.3 04/03/2023   HCT 43.9 04/03/2023   MCV 86.2 04/03/2023   PLT 309 04/03/2023     STUDIES: CT  ABDOMEN PELVIS W CONTRAST Result Date: 04/01/2023 CLINICAL DATA:  Right lung mass, metastatic disease/staging workup * Tracking Code: BO * EXAM: CT ABDOMEN AND PELVIS WITH CONTRAST TECHNIQUE: Multidetector CT imaging of the abdomen and pelvis was performed using the standard protocol following bolus administration of intravenous contrast. RADIATION DOSE REDUCTION: This exam was performed according to the departmental dose-optimization program which includes automated exposure control, adjustment of the mA and/or kV according to patient size and/or use of iterative reconstruction technique. CONTRAST:  75mL OMNIPAQUE IOHEXOL 300 MG/ML  SOLN COMPARISON:  CT chest 03/21/2023 and CT abdomen 09/25/2018 FINDINGS: Lower chest: Lobular right pleural thickening at the lung base compatible with exudative or malignant pleural effusion. Enlarged lymph nodes just outside of the pericardium in the chest partially confluent with the pleural rind. Subcarinal node 2.0 cm in short axis on image 5 series 2. Edema tracks in the soft tissues of the right chest and right breast. Elevated right hemidiaphragm. Hepatobiliary: Contracted gallbladder. Nonspecific 8 mm hypodense lesion in the lateral segment left hepatic lobe on image 41 series 5, not present on 09/25/2018. Likely benign but technically nonspecific. Pancreas: Unremarkable Spleen: Unremarkable Adrenals/Urinary Tract: Mild nodularity of the lateral limb right adrenal gland, 1.0 cm in diameter on image 33 series 2, not present on 09/25/2018, early metastatic lesion not excluded. Bilateral renal cysts. No further imaging workup of these lesions is indicated. Chronic atrophy of the left kidney lower pole anteriorly. Stomach/Bowel: The stomach is distended. No obvious proximal obstruction, the pyloric channel is patent. Direct right inguinal hernia contains the cecum and terminal ileum. No findings of obstruction or strangulation at this time. Anastomotic staple line the distal  sigmoid colon region. Vascular/Lymphatic: Atherosclerosis is present, including aortoiliac atherosclerotic disease. No pathologic adenopathy. Reproductive: Prostatomegaly. Other: Low-grade edema in the mesentery and omentum with trace edema along the right paracolic gutter. Nonspecific presacral edema. Musculoskeletal: Within the upper  margin of the left gluteus medius, a 6.0 by 4.2 by 5.8 cm (volume = 77 cm^3) lesion with enhancing margins is present also abutting the posterior margin of the left iliac bone. There is demineralization of the adjacent iliac bone with some associated cortical irregularity and underlying lucency. Possibilities include malignancy with bony and muscular involvement, versus a gluteal abscess with early adjacent osteomyelitis. There is also some abnormal thickening of the adjacent upper iliacus muscle raising the possibility of some extension through the iliac bone to involve the iliacus muscle. Lumbar spondylosis and degenerative disc disease observed causing multilevel impingement. Lytic lesion of the right anterior inter trochanteric portion of the hip, image 96 series 2, about 1.4 cm in diameter, likely metastatic. This was also observed on the dedicated CT of the right hip from 03/16/2023. IMPRESSION: 1. Lobular right pleural thickening at the lung base compatible with exudative or malignant pleural effusion. Enlarged lymph nodes just outside of the pericardium in the chest partially confluent with the pleural rind. 2. Mild nodularity of the lateral limb right adrenal gland, 1.0 cm in diameter, not present on 09/25/2018, early metastatic lesion not excluded. 3. Nonspecific 8 mm hypodense lesion in the lateral segment left hepatic lobe, not present on 09/25/2018. Likely benign but technically nonspecific. 4. Lytic lesion of the right anterior intertrochanteric portion of the hip, about 1.4 cm in diameter, likely metastatic. This was also observed on the dedicated CT of the right hip  from 03/16/2023. 5. Within the upper margin of the left gluteus medius, a 6.0 by 4.2 by 5.8 cm (volume = 77 cc) lesion with enhancing margins is present also abutting the posterior margin of the left iliac bone. There is demineralization of the adjacent iliac bone with some associated cortical irregularity and underlying lucency. Possibilities include malignancy with bony and muscular involvement, versus a gluteal abscess with early adjacent osteomyelitis. There is also some abnormal thickening of the adjacent upper iliacus muscle raising the possibility of some extension through the iliac bone to involve the iliacus muscle. 6. Direct right inguinal hernia contains the cecum and terminal ileum. No findings of obstruction or strangulation at this time. 7. Prostatomegaly. 8. Low-grade edema in the mesentery and omentum with trace edema along the right paracolic gutter. Nonspecific presacral edema. 9. Lumbar spondylosis and degenerative disc disease causing multilevel impingement. 10.  Aortic Atherosclerosis (ICD10-I70.0). Electronically Signed   By: Gaylyn Rong M.D.   On: 04/01/2023 18:34   MR Lumbar Spine W Wo Contrast Result Date: 03/31/2023 CLINICAL DATA:  Chronic low back pain EXAM: MRI LUMBAR SPINE WITHOUT AND WITH CONTRAST TECHNIQUE: Multiplanar and multiecho pulse sequences of the lumbar spine were obtained without and with intravenous contrast. CONTRAST:  7mL GADAVIST GADOBUTROL 1 MMOL/ML IV SOLN COMPARISON:  September 16, 2022 MRI lumbar spine FINDINGS: Segmentation:  Standard. Alignment:  Physiologic. Vertebrae:  No fracture, evidence of discitis, or bone lesion. Conus medullaris and cauda equina: Conus extends to the T12 level. Conus and cauda equina appear normal. Paraspinal and other soft tissues: Negative. Disc levels: T12 - L1 No disc protrusion. No foraminal stenosis. No central canal stenosis. L1 - L2 narrowing of the disc space with a central, left paracentral spondylitic bulging disc disc  herniation bone bridge complex flattening the thecal sac and encroaching particularly the left neural foramina small right lateral bulging disc encroaching the right neural foramina L2 - L3 severe narrowing of the disc space. With a large central spondylitic disc herniation bone bridge complex extending right and left laterally  into both neural foramina producing severe central canal stenosis, thecal sac compression aggravated by the hypertrophic osteoarthritic changes of the posterior elements, ligamentum flavum and hypertrophy of the facet joints. These findings are basically unchanged since prior examination. L3 - L4 severe narrowing of the disc space with a central, right and left paracentral spondylitic disc herniation bone bridge complex flattening the thecal sac and encroaching on both neural foramina, with significant central canal stenosis aggravated by the hypertrophic osteoarthritic changes of the posterior elements similar to prior examination. L4 - L5 narrowing of the disc space with degenerative disc disease spondylitic central left paracentral disc herniation bone bridge complex flattening the thecal sac and encroaching the left neural foramina aggravated by hypertrophic osteoarthritic changes of the left facet joints and ligamentum flavum. L5 - S1 No disc protrusion. No foraminal stenosis. No central canal stenosis. 3.8 by 2.7 cm in maximum diameter bone lesion,, hyperintense T2 weighted images mass which appears to be projecting within the posteromedial aspect of the left gluteus muscle against the adjacent iliac posterosuperior spine there is no enhancement after the intravenous administration of contrast material. This is a new lesion since prior examination. And on these images there is no clear evidence of bone involvement. Significance is unclear, perhaps ultrasound-guided aspiration may provide additional diagnostic information. If lesion can not be seen on ultrasound consider CT-guided  biopsy. No abnormal enhancement after the intravenous administration of contrast material IMPRESSION: *Multilevel degenerative disc disease with spondylitic disc herniations and central canal stenosis as described above. *3.8 x 2.7 cm in maximum diameter bone lesion, hyperintense T2 weighted images mass which appears to be projecting within the posteromedial aspect of the left gluteus muscle against the adjacent iliac posterosuperior spine there is no enhancement after the intravenous administration of contrast material. This is a new lesion since prior examination. Significance is unclear, perhaps ultrasound-guided aspiration may provide additional diagnostic information. If lesion can not be seen on ultrasound consider CT-guided biopsy. Electronically Signed   By: Shaaron Adler M.D.   On: 03/31/2023 10:47   CT CHEST W CONTRAST Result Date: 03/31/2023 CLINICAL DATA:  Mass in the right lung EXAM: CT CHEST WITH CONTRAST TECHNIQUE: Multidetector CT imaging of the chest was performed during intravenous contrast administration. RADIATION DOSE REDUCTION: This exam was performed according to the departmental dose-optimization program which includes automated exposure control, adjustment of the mA and/or kV according to patient size and/or use of iterative reconstruction technique. CONTRAST:  75mL OMNIPAQUE IOHEXOL 300 MG/ML  SOLN COMPARISON:  CT chest September 25, 2018 FINDINGS: Cardiovascular: No significant vascular findings. Normal heart size. No pericardial effusion. Extensive coronary artery calcifications. No pericardial effusions. Mediastinum/Nodes: Retrocaval pretracheal adenopathy measuring 1.9 x 1.6 cm. Pretracheal adenopathy 1.5 cm. Large lobular mass in the right upper lobe measuring 6 by 4.3 by 4.8 cm inseparable from the adjacent mediastinum and suprahilar region with postobstructive pneumonia pneumonitis and bronchiectasis. Thickening of the adjacent fissure with nodularity. Findings are highly  suspicious for a bronchogenic carcinoma. Lungs/Pleura: Small right pleural effusion. Postobstructive pneumonia pneumonitis of the right upper lobe as described above. 8 mm nodule in the left lower lobe anterior segment image 83. Upper Abdomen: No acute abnormality. Musculoskeletal: No chest wall abnormality. No acute or significant osseous findings. IMPRESSION: *Large lobular mass in the right upper lobe measuring 6 x 4.3 x 4.8 cm inseparable from the adjacent mediastinum and suprahilar region with postobstructive pneumonia pneumonitis and bronchiectasis. Findings are highly suspicious for a bronchogenic carcinoma. *Mediastinal adenopathy. *Small right pleural effusion. *8  mm nodule in the left lower lobe anterior segment. Electronically Signed   By: Shaaron Adler M.D.   On: 03/31/2023 10:37   CT Hip Right Wo Contrast Result Date: 03/16/2023 CLINICAL DATA:  Patient fell. Thigh pain. Clinical concern for hip fracture. EXAM: CT OF THE RIGHT HIP WITHOUT CONTRAST TECHNIQUE: Multidetector CT imaging of the right hip was performed according to the standard protocol. Multiplanar CT image reconstructions were also generated. RADIATION DOSE REDUCTION: This exam was performed according to the departmental dose-optimization program which includes automated exposure control, adjustment of the mA and/or kV according to patient size and/or use of iterative reconstruction technique. COMPARISON:  Abdomen and pelvis CT 09/25/2018 FINDINGS: Bones/Joint/Cartilage No right femoral neck fracture. Right superior and inferior pubic rami are intact. Small joint effusion evident. 1.1 x 2.8 x 1.0 cm lucent lesion identified medial cortex of the femoral metaphysis just anterior to the lesser trochanter. This is new in the interval since the 2022 exam. Ligaments Suboptimally assessed by CT. Muscles and Tendons No evidence for intramuscular or subcutaneous hematoma in the right hip region. No substantial subcutaneous edema. Soft tissues Right  groin hernia has been incompletely visualized but contains a segment of colon. No overt complicating features within the visualized portion of the hernia. Prostate gland appears enlarged. IMPRESSION: 1. No acute bony findings in the right hip. No evidence for intramuscular or subcutaneous hematoma. 2. 1.1 x 2.8 x 1.0 cm lucent lesion medial cortex of the femoral metaphysis just anterior to the lesser trochanter. This is new in the interval since the 2022 exam. This is nonspecific and while not overtly concerning, lesion warrants additional imaging follow-up. Nonemergent outpatient MRI of the right hip with and without contrast recommended to further evaluate. 3. Right groin hernia has been incompletely visualized but contains a segment of colon. No overt complicating features within the visualized portion of the hernia. Electronically Signed   By: Kennith Center M.D.   On: 03/16/2023 05:39   DG Femur Min 2 Views Right Result Date: 03/16/2023 CLINICAL DATA:  Status post fall. EXAM: RIGHT FEMUR 2 VIEWS COMPARISON:  None Available. FINDINGS: There is no evidence of fracture or other focal bone lesions. Mild to moderate severity vascular calcification is seen. Soft tissue structures are otherwise unremarkable. IMPRESSION: No acute osseous abnormality. Electronically Signed   By: Aram Candela M.D.   On: 03/16/2023 00:47   DG Ankle Complete Right Result Date: 03/16/2023 CLINICAL DATA:  Status post fall. EXAM: RIGHT ANKLE - COMPLETE 3+ VIEW COMPARISON:  None Available. FINDINGS: There is a small, nondisplaced acute fracture involving the distal tip of the right lateral malleolus. There is no evidence of dislocation. There is no evidence of arthropathy or other focal bone abnormality. Mild diffuse soft tissue swelling is present. IMPRESSION: Small, nondisplaced acute fracture involving the distal tip of the right lateral malleolus. Electronically Signed   By: Aram Candela M.D.   On: 03/16/2023 00:46     ASSESSMENT: Lung mass, with possible metastatic disease.  PLAN:    Lung mass, with possible metastatic disease: Unlikely recurrence of patient's colon cancer. Right upper lobe lung mass measuring 6.0 x 4.3 x 4.8 cm on CT scan from March 21, 2023.  Mass was also previously seen at [facility] in fall 2024 at which time patient apparently declined biopsy or further workup.  Recent CT scan of the abdomen and pelvis revealed a left gluteus lesion concerning for metastatic lesion or possible abscess.  Patient also noted to have several bony  lytic lesions as well.  After lengthy discussion with the patient, he is unclear if he will want to pursue treatment but has agreed to have PET scan and consideration of biopsy upon discharge.  No further intervention is needed from an oncology standpoint.  Will arrange imaging and follow-up upon discharge. History of colon cancer: Patient completed treatment nearly 10 years ago.  CEA was ordered for completeness and is pending at time of dictation. Pain: Unclear if related to likely malignancy.  Appreciate palliative care input.  Patient may benefit from XRT in the future pending results of PET scan.  Appreciate consult, will follow.    Jeralyn Ruths, MD   04/03/2023 6:00 PM

## 2023-04-03 NOTE — Progress Notes (Signed)
   04/03/23 1500  Spiritual Encounters  Type of Visit Initial  Care provided to: Pt and family  Referral source Physician Windy Fast, Ivin Booty NP)  Reason for visit Advance directives  OnCall Visit No   Chaplain responded to request for AD.  Explained and discussed documents with patient and family.  Documents left with patient for continued review.  Patient aware to notify staff as needed regarding the document.  Will follow up as appropriate. Chaplain services remain available as the need arises.

## 2023-04-03 NOTE — Evaluation (Signed)
 Physical Therapy Evaluation Patient Details Name: Cameron Howe MRN: [id number] DOB: September 19, 1945 Today's Date: 04/03/2023  History of Present Illness  Pt is a 78 y.o. male with medical history significant for osteoarthritis, COPD, type 2 diabetes mellitus, essential, hypertension and tobacco abuse, who presented to the emergency room with acute onset of right-sided sciatica-like pain that has been significantly worsening lately giving him trouble with ambulation. MD assessment includes: acute on chronic low back pain, R-sided sciatica, RUL lung mass, left gluteal mass, left iliac bone and right femur lesion, most likely metastatic disease, and hematuria.   Clinical Impression  Pt was pleasant and motivated to participate during the session but ultimately was very limited by pain with R femur pain seemingly the most limiting.  Pt required +2 total assist with all bed mobility tasks as well as to both come to standing and remain in standing position x 5 sec max.  Pt presented with posterior lean in sitting at the EOB that was likely due to pain but sitting balance did improve grossly with anterior weight shifting activities.  Pt will benefit from continued PT services upon discharge to safely address deficits listed in patient problem list for decreased caregiver assistance and eventual return to PLOF.          If plan is discharge home, recommend the following: Two people to help with walking and/or transfers;A lot of help with bathing/dressing/bathroom;Assistance with cooking/housework;Direct supervision/assist for medications management;Assist for transportation;Help with stairs or ramp for entrance   Can travel by private vehicle   No    Equipment Recommendations Rolling walker (2 wheels)  Recommendations for Other Services       Functional Status Assessment Patient has had a recent decline in their functional status and/or demonstrates limited ability to make significant improvements in  function in a reasonable and predictable amount of time     Precautions / Restrictions Precautions Precautions: Fall Restrictions Weight Bearing Restrictions Per Provider Order: No      Mobility  Bed Mobility Overal bed mobility: Needs Assistance Bed Mobility: Supine to Sit, Sit to Supine     Supine to sit: Total assist, +2 for physical assistance Sit to supine: Total assist, +2 for physical assistance   General bed mobility comments: Attempted log roll technique with pt unable to tolerate rolling to L side, required total assist with sup to/from sit for BLE and trunk control    Transfers Overall transfer level: Needs assistance Equipment used: Rolling walker (2 wheels) Transfers: Sit to/from Stand Sit to Stand: Max assist, +2 physical assistance, From elevated surface           General transfer comment: Pt required +2 max A to clear the surface of the bed and to remain in standing for a max of around 5 sec; max multi-modal cues for increased trunk flexion to assist with coming to standing    Ambulation/Gait               General Gait Details: Unable  Stairs            Wheelchair Mobility     Tilt Bed    Modified Rankin (Stroke Patients Only)       Balance Overall balance assessment: Needs assistance Sitting-balance support: Feet supported, Bilateral upper extremity supported Sitting balance-Leahy Scale: Poor Sitting balance - Comments: Frequent min to mod A to prevent posterior LOB Postural control: Posterior lean Standing balance support: Bilateral upper extremity supported, Reliant on assistive device for balance Standing balance-Leahy Scale: Zero  Standing balance comment: +2 max A to remain in standing                             Pertinent Vitals/Pain Pain Assessment Pain Assessment: 0-10 Pain Score: 5  Pain Location: low back, R hip and femur area, L hip Pain Descriptors / Indicators: Aching, Sore Pain Intervention(s):  Repositioned, Premedicated before session, Monitored during session, Patient requesting pain meds-RN notified, RN gave pain meds during session    Home Living Family/patient expects to be discharged to:: Private residence Living Arrangements: Alone Available Help at Discharge: Family;Available PRN/intermittently Type of Home: Mobile home Home Access: Stairs to enter Entrance Stairs-Rails: Right;Left (too wide for both) Entrance Stairs-Number of Steps: 5   Home Layout: One level Home Equipment: Rollator (4 wheels);Shower seat;BSC/3in1      Prior Function Prior Level of Function : Independent/Modified Independent;History of Falls (last six months)             Mobility Comments: Mod Ind amb with a rollator, 3 falls in the last 6 months secondary to RLE buckling ADLs Comments: Ind with ADLs     Extremity/Trunk Assessment   Upper Extremity Assessment Upper Extremity Assessment: Generalized weakness    Lower Extremity Assessment Lower Extremity Assessment: Generalized weakness       Communication   Communication Communication: No apparent difficulties    Cognition Arousal: Alert Behavior During Therapy: WFL for tasks assessed/performed   PT - Cognitive impairments: No apparent impairments                         Following commands: Intact       Cueing Cueing Techniques: Verbal cues, Tactile cues, Visual cues     General Comments      Exercises  Anterior weight shifting in sitting at the EOB to address posterior instability  Sit to stand transfer training with emphasis on increased trunk flex and hand placement    Assessment/Plan    PT Assessment Patient needs continued PT services  PT Problem List Decreased strength;Decreased activity tolerance;Decreased balance;Decreased mobility;Decreased knowledge of use of DME;Pain       PT Treatment Interventions DME instruction;Gait training;Stair training;Functional mobility training;Therapeutic  activities;Therapeutic exercise;Balance training;Patient/family education    PT Goals (Current goals can be found in the Care Plan section)  Acute Rehab PT Goals Patient Stated Goal: To walk with less pain PT Goal Formulation: With patient Time For Goal Achievement: 04/16/23 Potential to Achieve Goals: Fair    Frequency Min 2X/week     Co-evaluation               AM-PAC PT "6 Clicks" Mobility  Outcome Measure Help needed turning from your back to your side while in a flat bed without using bedrails?: Total Help needed moving from lying on your back to sitting on the side of a flat bed without using bedrails?: Total Help needed moving to and from a bed to a chair (including a wheelchair)?: Total Help needed standing up from a chair using your arms (e.g., wheelchair or bedside chair)?: Total Help needed to walk in hospital room?: Total Help needed climbing 3-5 steps with a railing? : Total 6 Click Score: 6    End of Session Equipment Utilized During Treatment: Gait belt Activity Tolerance: Patient limited by pain Patient left: in bed;with call bell/phone within reach;with bed alarm set;with family/visitor present Nurse Communication: Mobility status PT Visit Diagnosis: History of  falling (Z91.81);Unsteadiness on feet (R26.81);Difficulty in walking, not elsewhere classified (R26.2);Muscle weakness (generalized) (M62.81);Pain Pain - Right/Left: Right Pain - part of body: Leg (L hip, low back)    Time: 1610-9604 PT Time Calculation (min) (ACUTE ONLY): 36 min   Charges:   PT Evaluation $PT Eval Moderate Complexity: 1 Mod PT Treatments $Therapeutic Activity: 8-22 mins PT General Charges $$ ACUTE PT VISIT: 1 Visit    D. Elly Modena PT, DPT 04/03/23, 12:15 PM

## 2023-04-04 DIAGNOSIS — M545 Low back pain, unspecified: Secondary | ICD-10-CM | POA: Diagnosis not present

## 2023-04-04 DIAGNOSIS — G8929 Other chronic pain: Secondary | ICD-10-CM | POA: Diagnosis not present

## 2023-04-04 LAB — MAGNESIUM: Magnesium: 2.4 mg/dL (ref 1.7–2.4)

## 2023-04-04 LAB — BASIC METABOLIC PANEL
Anion gap: 7 (ref 5–15)
BUN: 24 mg/dL — ABNORMAL HIGH (ref 8–23)
CO2: 28 mmol/L (ref 22–32)
Calcium: 9 mg/dL (ref 8.9–10.3)
Chloride: 102 mmol/L (ref 98–111)
Creatinine, Ser: 0.91 mg/dL (ref 0.61–1.24)
GFR, Estimated: >60 mL/min (ref >60)
Glucose, Bld: 100 mg/dL — ABNORMAL HIGH (ref 70–99)
Potassium: 4.6 mmol/L (ref 3.5–5.1)
Sodium: 137 mmol/L (ref 135–145)

## 2023-04-04 LAB — CBC
HCT: 42 % (ref 39.0–52.0)
Hemoglobin: 13.1 g/dL (ref 13.0–17.0)
MCH: 26.6 pg (ref 26.0–34.0)
MCHC: 31.2 g/dL (ref 30.0–36.0)
MCV: 85.2 fL (ref 80.0–100.0)
Platelets: 290 10*3/uL (ref 150–400)
RBC: 4.93 MIL/uL (ref 4.22–5.81)
RDW: 16.6 % — ABNORMAL HIGH (ref 11.5–15.5)
WBC: 7.2 10*3/uL (ref 4.0–10.5)
nRBC: 0 % (ref 0.0–0.2)

## 2023-04-04 LAB — PHOSPHORUS: Phosphorus: 3.2 mg/dL (ref 2.5–4.6)

## 2023-04-04 MED ORDER — ALUM & MAG HYDROXIDE-SIMETH 200-200-20 MG/5ML PO SUSP
15.0000 mL | Freq: Four times a day (QID) | ORAL | Status: DC | PRN
  Administered 2023-04-07: 15 mL via ORAL
  Filled 2023-04-04: qty 30

## 2023-04-04 MED ORDER — PANTOPRAZOLE SODIUM 40 MG PO TBEC
40.0000 mg | DELAYED_RELEASE_TABLET | Freq: Every day | ORAL | Status: DC
  Administered 2023-04-04 – 2023-04-11 (×8): 40 mg via ORAL
  Filled 2023-04-04 (×8): qty 1

## 2023-04-04 NOTE — Progress Notes (Signed)
   04/04/23 1600  Spiritual Encounters  Type of Visit Follow up  Care provided to: Pt and family  Reason for visit Advance directives  OnCall Visit Yes   Chaplain followed up with status of completion for AD.  Paperwork completed and ready for Notary.  No Notary available at this hour.  Will follow up as appropriate tomorrow. Chaplain spiritual support remain available as the need arises.

## 2023-04-04 NOTE — Progress Notes (Addendum)
 Physical Therapy Treatment Patient Details Name: Cameron Howe MRN: [id number] DOB: March 10, 1945 Today's Date: 04/04/2023   History of Present Illness Pt is a 78 y.o. male with medical history significant for osteoarthritis, COPD, type 2 diabetes mellitus, essential, hypertension and tobacco abuse, who presented to the emergency room with acute onset of right-sided sciatica-like pain that has been significantly worsening lately giving him trouble with ambulation. MD assessment includes: acute on chronic low back pain, R-sided sciatica, RUL lung mass, left gluteal mass, left iliac bone and right femur lesion, most likely metastatic disease, and hematuria.    PT Comments  Pt was pleasant and motivated to participate during the session and put forth good effort throughout but ultimately was very limited by pain despite every effort to have pt as medicated for pain control as possible prior to working with him. Pt required heavy assistance for BLE and trunk control during log roll training and presented with poor static sitting balance with frequent posterior lean.  Significant time spent in static sitting at EOB to address posterior lean with anterior weight shifting and for general improved activity tolerance. Attempts made at standing from EOB with +2 assist with a RW and then with a [name] with pt unable to clear the surface of the bed despite +2 assist being provided.  Pt will benefit from continued PT services upon discharge to safely address deficits listed in patient problem list for decreased caregiver assistance and eventual return to PLOF.      If plan is discharge home, recommend the following: Two people to help with walking and/or transfers;A lot of help with bathing/dressing/bathroom;Assistance with cooking/housework;Direct supervision/assist for medications management;Assist for transportation;Help with stairs or ramp for entrance   Can travel by private vehicle     No  Equipment  Recommendations  Rolling walker (2 wheels)    Recommendations for Other Services    Addendum: Co-Tx with OT for pt and therapist safety to address strength, balance, and activity tolerance    Precautions / Restrictions Precautions Precautions: Fall Restrictions Weight Bearing Restrictions Per Provider Order: No     Mobility  Bed Mobility Overal bed mobility: Needs Assistance Bed Mobility: Sit to Supine, Rolling, Sidelying to Sit Rolling: Mod assist, +2 for physical assistance Sidelying to sit: +2 for physical assistance, Total assist   Sit to supine: Total assist, +2 for physical assistance   General bed mobility comments: Log roll technique rolling to L with pillow between the knees for support    Transfers                   General transfer comment: Attempts made at standing at EOB with +2 assist with a RW and then with a [name] with pt unable to clear the surface of the bed despite being pre-medicated with multiple pain medications    Ambulation/Gait                   Stairs             Wheelchair Mobility     Tilt Bed    Modified Rankin (Stroke Patients Only)       Balance Overall balance assessment: Needs assistance Sitting-balance support: Feet supported, Bilateral upper extremity supported Sitting balance-Leahy Scale: Poor Sitting balance - Comments: Frequent min to mod A to prevent posterior LOB  Communication Communication Communication: No apparent difficulties  Cognition Arousal: Alert Behavior During Therapy: WFL for tasks assessed/performed   PT - Cognitive impairments: No apparent impairments                         Following commands: Intact      Cueing Cueing Techniques: Verbal cues, Tactile cues, Visual cues  Exercises Other Exercises: PLB education for pain control  Other Exercises: Static sitting at EOB with occasinoal anterior weight shifting for  improved activity tolerance and stability Other Exercises: Log roll training for decreased caregiver assistance    General Comments        Pertinent Vitals/Pain Pain Assessment Pain Assessment: PAINAD Breathing: occasional labored breathing, short period of hyperventilation Negative Vocalization: occasional moan/groan, low speech, negative/disapproving quality Facial Expression: facial grimacing Body Language: tense, distressed pacing, fidgeting Consolability: distracted or reassured by voice/touch PAINAD Score: 6 Pain Location: low back, R hip and femur area Pain Descriptors / Indicators: Aching, Sore, Sharp, Grimacing, Guarding Pain Intervention(s): Repositioned, Premedicated before session, Monitored during session, Limited activity within patient's tolerance    Home Living                          Prior Function            PT Goals (current goals can now be found in the care plan section) Progress towards PT goals: Progressing toward goals    Frequency    Min 2X/week      PT Plan      Co-evaluation              AM-PAC PT "6 Clicks" Mobility   Outcome Measure  Help needed turning from your back to your side while in a flat bed without using bedrails?: A Lot Help needed moving from lying on your back to sitting on the side of a flat bed without using bedrails?: Total Help needed moving to and from a bed to a chair (including a wheelchair)?: Total Help needed standing up from a chair using your arms (e.g., wheelchair or bedside chair)?: Total Help needed to walk in hospital room?: Total Help needed climbing 3-5 steps with a railing? : Total 6 Click Score: 7    End of Session Equipment Utilized During Treatment: Gait belt Activity Tolerance: Patient limited by pain Patient left: in bed;with call bell/phone within reach;with bed alarm set;with family/visitor present Nurse Communication: Mobility status PT Visit Diagnosis: History of falling  (Z91.81);Unsteadiness on feet (R26.81);Difficulty in walking, not elsewhere classified (R26.2);Muscle weakness (generalized) (M62.81);Pain Pain - Right/Left: Right Pain - part of body: Leg     Time: 1610-9604 PT Time Calculation (min) (ACUTE ONLY): 24 min  Charges:    $Therapeutic Activity: 23-37 mins PT General Charges $$ ACUTE PT VISIT: 1 Visit                     D. Elly Modena PT, DPT 04/04/23, 3:37 PM

## 2023-04-04 NOTE — Evaluation (Signed)
 Occupational Therapy Evaluation Patient Details Name: Cameron Howe MRN: [id number] DOB: [birth year] Today's Date: 04/04/2023   History of Present Illness   Pt is a 78 y.o. male with medical history significant for osteoarthritis, COPD, type 2 diabetes mellitus, essential, hypertension and tobacco abuse, who presented to the emergency room with acute onset of right-sided sciatica-like pain that has been significantly worsening lately giving him trouble with ambulation. MD assessment includes: acute on chronic low back pain, R-sided sciatica, RUL lung mass, left gluteal mass, left iliac bone and right femur lesion, most likely metastatic disease, and hematuria.   Clinical Impressions Cameron Howe was seen for OT evaluation this date. Prior to hospital admission, pt was MOD I using 4WW, reports falls hx. Pt lives with spouse. Pt currently requires MAX A don/doff gown at bed level. MOD A x2 rolling bed level. TOTAL A x2 to exit and return to bed. Tolerated ~10 min static sitting with MIN-MAX A, heavy posterior lean.   Attempted standing trials however pt unable to clear rear or initiate any standing with TOTAL x2 and [name] from elevated bed height. Reports pain limiting standing attempts (premedicated for session). Requires step by step explanation for sequencing transfers. Pt would benefit from skilled OT to address noted impairments and functional limitations (see below for any additional details). Upon hospital discharge, recommend OT follow up.    If plan is discharge home, recommend the following:   Two people to help with walking and/or transfers;Two people to help with bathing/dressing/bathroom     Functional Status Assessment   Patient has had a recent decline in their functional status and demonstrates the ability to make significant improvements in function in a reasonable and predictable amount of time.     Equipment Recommendations   Hospital bed;Hoyer lift      Recommendations for Other Services         Precautions/Restrictions   Precautions Precautions: Fall Recall of Precautions/Restrictions: Intact Restrictions Weight Bearing Restrictions Per Provider Order: No     Mobility Bed Mobility Overal bed mobility: Needs Assistance Bed Mobility: Rolling, Sidelying to Sit, Sit to Supine Rolling: Mod assist, +2 for physical assistance Sidelying to sit: Total assist, +2 for physical assistance   Sit to supine: Total assist, +2 for physical assistance        Transfers                   General transfer comment: unable to clear rear or initiate any standing attempts with TOTAL x2 and [name] from elevated bed height.       Balance Overall balance assessment: Needs assistance Sitting-balance support: Feet supported, Bilateral upper extremity supported Sitting balance-Leahy Scale: Poor Sitting balance - Comments: with pain pt pushes posteriorly requiring MIN-MAX A to correct                                   ADL either performed or assessed with clinical judgement   ADL Overall ADL's : Needs assistance/impaired                                       General ADL Comments: MAX A don/doff gown at bed level. MAX A x2 don B socks in sitting.       Pertinent Vitals/Pain Pain Assessment Pain Assessment: 0-10 Pain Score: 10-Worst pain ever  Pain Location: R thigh Pain Descriptors / Indicators: Grimacing, Sharp Pain Intervention(s): Premedicated before session, Monitored during session     Extremity/Trunk Assessment Upper Extremity Assessment Upper Extremity Assessment: Generalized weakness;RUE deficits/detail RUE Deficits / Details: 2/5   Lower Extremity Assessment Lower Extremity Assessment: RLE deficits/detail;Generalized weakness RLE Deficits / Details: minimal active hip flexion noted - limited by pain RLE: Unable to fully assess due to pain       Communication  Communication Communication: No apparent difficulties   Cognition Arousal: Alert Behavior During Therapy: WFL for tasks assessed/performed Cognition: Cognition impaired             OT - Cognition Comments: ? cognition vs pain medicine. Requires step by step explanation for sequencing transfers                 Following commands: Intact       Cueing  General Comments   Cueing Techniques: Verbal cues;Tactile cues;Visual cues      Exercises     Shoulder Instructions      Home Living Family/patient expects to be discharged to:: Private residence Living Arrangements: Alone Available Help at Discharge: Family;Available PRN/intermittently Type of Home: Mobile home Home Access: Stairs to enter Entrance Stairs-Number of Steps: 5 Entrance Stairs-Rails: Right;Left Home Layout: One level     Bathroom Shower/Tub: Producer, television/film/video: Handicapped height     Home Equipment: Rollator (4 wheels);Shower seat;BSC/3in1          Prior Functioning/Environment Prior Level of Function : Independent/Modified Independent;History of Falls (last six months)             Mobility Comments: Mod Ind amb with a rollator, 3 falls in the last 6 months secondary to RLE buckling ADLs Comments: Ind with ADLs    OT Problem List: Decreased strength;Decreased range of motion;Decreased activity tolerance;Impaired balance (sitting and/or standing);Pain   OT Treatment/Interventions: Self-care/ADL training;Therapeutic exercise;Energy conservation;DME and/or AE instruction;Therapeutic activities;Balance training      OT Goals(Current goals can be found in the care plan section)   Acute Rehab OT Goals Patient Stated Goal: to improve pain OT Goal Formulation: With patient/family Time For Goal Achievement: 04/18/23 Potential to Achieve Goals: Fair ADL Goals Pt Will Perform Grooming: with set-up;with supervision;sitting Pt Will Perform Lower Body Dressing: with mod  assist;sitting/lateral leans Pt Will Transfer to Toilet: with max assist;squat pivot transfer;bedside commode   OT Frequency:  Min 1X/week    Co-evaluation PT/OT/SLP Co-Evaluation/Treatment: Yes Reason for Co-Treatment: For patient/therapist safety;To address functional/ADL transfers PT goals addressed during session: Mobility/safety with mobility OT goals addressed during session: ADL's and self-care      AM-PAC OT "6 Clicks" Daily Activity     Outcome Measure Help from another person eating meals?: A Lot Help from another person taking care of personal grooming?: A Lot Help from another person toileting, which includes using toliet, bedpan, or urinal?: Total Help from another person bathing (including washing, rinsing, drying)?: Total Help from another person to put on and taking off regular upper body clothing?: A Lot Help from another person to put on and taking off regular lower body clothing?: Total 6 Click Score: 9   End of Session Equipment Utilized During Treatment: Rolling walker (2 wheels);Gait belt Nurse Communication: Mobility status  Activity Tolerance: Patient limited by pain Patient left: in bed;with call bell/phone within reach;with bed alarm set;with family/visitor present  OT Visit Diagnosis: Other abnormalities of gait and mobility (R26.89);Muscle weakness (generalized) (M62.81)  Time: 1610-9604 OT Time Calculation (min): 26 min Charges:  OT General Charges $OT Visit: 1 Visit OT Evaluation $OT Eval Moderate Complexity: 1 Mod  Kathie Dike, M.S. OTR/L  04/04/23, 3:41 PM  ascom [phone]

## 2023-04-04 NOTE — Plan of Care (Signed)
  Problem: Education: Goal: Knowledge of General Education information will improve Description: Including pain rating scale, medication(s)/side effects and non-pharmacologic comfort measures Outcome: Progressing   Problem: Activity: Goal: Risk for activity intolerance will decrease Outcome: Progressing   Problem: Nutrition: Goal: Adequate nutrition will be maintained Outcome: Progressing   Problem: Elimination: Goal: Will not experience complications related to bowel motility Outcome: Progressing Goal: Will not experience complications related to urinary retention Outcome: Progressing   Problem: Pain Managment: Goal: General experience of comfort will improve and/or be controlled Outcome: Progressing   Problem: Safety: Goal: Ability to remain free from injury will improve Outcome: Progressing

## 2023-04-04 NOTE — Progress Notes (Signed)
 [facility] Progress Note  Patient: Cameron Howe    ZOX:[id number]  DOA: 03/31/2023     Date of Service: the patient was seen and examined on 04/04/2023  Chief Complaint  Patient presents with   Leg Pain    Chronic sciatica    Brief hospital course: Cameron Howe is a 78 y.o. male with medical history significant for osteoarthritis, COPD, type 2 diabetes mellitus, essential, hypertension and tobacco abuse, who presented to the emergency room with acute onset of right-sided sciatica-like pain that has been significantly worsening lately giving him trouble with ambulation.  He was able to get from bed to wheelchair and currently is not able to ambulate or sit in his wheelchair.  He is not able to perform his ADLs secondarily.  His pain has been going on over the last 6 years.  No fever or chills.  No nausea or vomiting or abdominal pain.  No chest pain or palpitations.  No cough or wheezing or dyspnea.  No other paresthesias or focal muscle weakness.   ED Course: When the patient  came to the ER, BP was 172/75 with otherwise normal vital signs.  Labs revealed albumin of 2.9 with otherwise unremarkable CMP.  CBC was within normal. 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 *3.8 x 2.7 cm in maximum diameter bone lesion, hyperintense T2 weighted images mass which appears to be projecting within the posteromedial aspect of the left gluteus muscle against the adjacent iliac posterosuperior spine there is no enhancement after the intravenous administration of contrast material. This is a new lesion since prior examination. Significance is unclear, perhaps ultrasound-guided aspiration may provide additional diagnostic information. If lesion can not be seen on ultrasound consider CT-guided biopsy.   The patient was  given 650 mg p.o. Tylenol.  He will be admitted to a medical bed for further evaluation and management.   Assessment and Plan:  # Acute on chronic low back pain This is associated with right-sided sciatica. S/p 1 dose of Decadron for now. Continue pain management prn Neurosurgery consulted, recommended symptomatic treatment, okay to work with PT and OT, no urgent surgical intervention.  Patient needs workup for malignancy. Related care consulted, started fentanyl patch.  # RUL Lung Mass Pt was following at [facility] and he was diagnosed with lung mass in October 2024 but he did not pursue for any workup and continued smoking. CT Chest: Large lobular mass in the right upper lobe measuring 6 x 4.3 x 4.8 cm inseparable from the adjacent mediastinum and suprahilar region with postobstructive pneumonia pneumonitis and bronchiectasis.  Findings are highly suspicious for a bronchogenic carcinoma. *Mediastinal adenopathy. *Small right pleural effusion. *8 mm nodule in the left lower lobe anterior segment. Discussed with pulmonologist, patient needs PET scan as an outpatient and follow-up with oncologist.   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 Orlie Dakin, recommended PET scan and biopsy as an outpatient.  Patient is not sure about moving forward for treatment plan yet, but he agreed for PET scan and biopsy.    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 - We will continue Neurontin as mentioned above.   # Peripheral neuropathy associated with right-sided sciatica. continue Neurontin.   #  Dyslipidemia continue statin therapy.   # Essential hypertension continue antihypertensive therapy.   # Hematuria, unknown cause CT A/p shows bilateral renal cyst and chronic atrophy of left kidney  UA shows hematuria. urine culture NGTD Monitor H&H  Goals of care discussion, agreed for DNR/DNI Palliative care consulted for goals of care discussion, overall prognosis is poor.  Patient may need comfort care and hospice down the road.   Body mass index is 25.67 kg/m.  Interventions:  Diet: Heart healthy/carb modified diet DVT Prophylaxis: Subcutaneous Lovenox   Advance goals of care discussion: DNR-limited  Family Communication: family was not present at bedside, at the time of interview.  The pt provided permission to discuss medical plan with the family. Opportunity was given to ask question and all questions were answered satisfactorily.   Disposition:  Pt is from Home, admitted with Back pain and Lung mass with metastatic lesions.  Clinically stable, medically optimized to discharge to SNF when bed will be available. Patient needs PET scan and biopsy as an outpatient TOC following for discharge planning   Subjective: No significant events overnight, shortness of breath is well-controlled, still has significant pain in the back, trying to work with PT. Patient agreed for SNF placement   Physical Exam: General: NAD, lying comfortably Appear in no distress, affect appropriate Eyes: PERRLA ENT: Oral Mucosa Clear, moist  Neck: no JVD,  Cardiovascular: S1 and S2 Present, no Murmur,  Respiratory: good respiratory effort, Bilateral Air entry equal and Decreased, no Crackles, no wheezes Abdomen: Bowel Sound present, Soft and no tenderness,  Skin: no rashes Extremities: no Pedal edema, no calf tenderness Neurologic: without any new focal findings Gait not checked due to patient safety concerns  Vitals:   04/04/23 0821 04/04/23 0857 04/04/23 1050 04/04/23 1054  BP:       Pulse:      Resp:      Temp:      TempSrc:      SpO2: 99% 95% 100% 97%  Weight:      Height:        Intake/Output Summary (Last 24 hours) at 04/04/2023 1451 Last data filed at 04/04/2023 1300 Gross per 24 hour  Intake 700 ml  Output 1350 ml  Net -650 ml   Filed Weights   04/03/23 2017  Weight: 78.8 kg    Data Reviewed: I have personally reviewed and interpreted daily labs, tele strips, imagings as discussed above. I reviewed all nursing notes, pharmacy notes, vitals, pertinent old records I have discussed plan of care as described above with RN and patient/family.  CBC: Recent Labs  Lab 03/31/23 1940 04/01/23 0525 04/02/23 0236 04/03/23 0548 04/04/23 0511  WBC 7.0 5.8 9.3 7.4 7.2  HGB 13.9 13.4 13.3 13.3 13.1  HCT 45.4 43.0 42.4 43.9 42.0  MCV 87.0 84.8 84.5 86.2 85.2  PLT 345 361 330 309 290   Basic Metabolic Panel: Recent Labs  Lab 03/31/23 1940 04/01/23 0525 04/02/23 0236 04/03/23 0548 04/04/23 0511  NA 139 140 138 142 137  K 4.4 4.2 3.2* 4.7 4.6  CL 105 105 103 107 102  CO2 26 26 28 29 28   GLUCOSE 103* 135* 123* 112* 100*  BUN 17 18 24* 21 24*  CREATININE 0.89 0.88 0.95 0.84 0.91  CALCIUM  8.8* 9.1 8.6* 8.7* 9.0  MG  --   --  2.2 2.4 2.4  PHOS  --   --  3.2 3.5 3.2    Studies: No results found.   Scheduled Meds:  enoxaparin (LOVENOX) injection  40 mg Subcutaneous Q24H   fentaNYL  1 patch Transdermal Q72H   lidocaine  1 patch Transdermal Q24H   metoprolol tartrate  25 mg Oral BID   polyethylene glycol  17 g Oral Daily   senna  1 tablet Oral Daily   umeclidinium-vilanterol  1 puff Inhalation Daily   Continuous Infusions:   PRN Meds: acetaminophen **OR** acetaminophen, albuterol, hydrALAZINE **OR** hydrALAZINE, magnesium hydroxide, morphine injection, ondansetron **OR** ondansetron (ZOFRAN) IV, oxyCODONE, traZODone  Time spent: 40 minutes  Author: Gillis Santa. MD [facility] 04/04/2023 2:51 PM  To reach On-call, see care teams  to locate the attending and reach out to them via [email or web address]. If 7PM-7AM, please contact night-coverage If you still have difficulty reaching the attending provider, please page the [facility] (Director on Call) for [facility] on amion for assistance.

## 2023-04-05 DIAGNOSIS — G8929 Other chronic pain: Secondary | ICD-10-CM | POA: Diagnosis not present

## 2023-04-05 DIAGNOSIS — M545 Low back pain, unspecified: Secondary | ICD-10-CM | POA: Diagnosis not present

## 2023-04-05 LAB — BASIC METABOLIC PANEL
Anion gap: 8 (ref 5–15)
BUN: 22 mg/dL (ref 8–23)
CO2: 23 mmol/L (ref 22–32)
Calcium: 8.7 mg/dL — ABNORMAL LOW (ref 8.9–10.3)
Chloride: 106 mmol/L (ref 98–111)
Creatinine, Ser: 0.7 mg/dL (ref 0.61–1.24)
GFR, Estimated: >60 mL/min (ref >60)
Glucose, Bld: 113 mg/dL — ABNORMAL HIGH (ref 70–99)
Potassium: 4.3 mmol/L (ref 3.5–5.1)
Sodium: 137 mmol/L (ref 135–145)

## 2023-04-05 LAB — CBC
HCT: 43 % (ref 39.0–52.0)
Hemoglobin: 13.3 g/dL (ref 13.0–17.0)
MCH: 26.3 pg (ref 26.0–34.0)
MCHC: 30.9 g/dL (ref 30.0–36.0)
MCV: 85 fL (ref 80.0–100.0)
Platelets: 288 10*3/uL (ref 150–400)
RBC: 5.06 MIL/uL (ref 4.22–5.81)
RDW: 16.9 % — ABNORMAL HIGH (ref 11.5–15.5)
WBC: 7.4 10*3/uL (ref 4.0–10.5)
nRBC: 0 % (ref 0.0–0.2)

## 2023-04-05 NOTE — Progress Notes (Signed)
 This RN removed and wasted patient's old Fentanyl patch when new patch was placed. Leeanne Deed, RN witnessed the waste in the med room.

## 2023-04-05 NOTE — Progress Notes (Signed)
 [facility] Progress Note  Patient: Cameron Howe    BJY:[id number]  DOA: 03/31/2023     Date of Service: the patient was seen and examined on 04/05/2023  Chief Complaint  Patient presents with   Leg Pain    Chronic sciatica    Brief hospital course: Cameron Howe is a 78 y.o. male with medical history significant for osteoarthritis, COPD, type 2 diabetes mellitus, essential, hypertension and tobacco abuse, who presented to the emergency room with acute onset of right-sided sciatica-like pain that has been significantly worsening lately giving him trouble with ambulation.  He was able to get from bed to wheelchair and currently is not able to ambulate or sit in his wheelchair.  He is not able to perform his ADLs secondarily.  His pain has been going on over the last 6 years.  No fever or chills.  No nausea or vomiting or abdominal pain.  No chest pain or palpitations.  No cough or wheezing or dyspnea.  No other paresthesias or focal muscle weakness.   ED Course: When the patient  came to the ER, BP was 172/75 with otherwise normal vital signs.  Labs revealed albumin of 2.9 with otherwise unremarkable CMP.  CBC was within normal. 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 *3.8 x 2.7 cm in maximum diameter bone lesion, hyperintense T2 weighted images mass which appears to be projecting within the posteromedial aspect of the left gluteus muscle against the adjacent iliac posterosuperior spine there is no enhancement after the intravenous administration of contrast material. This is a new lesion since prior examination. Significance is unclear, perhaps ultrasound-guided aspiration may provide additional diagnostic information. If lesion can not be seen on ultrasound consider CT-guided biopsy.   The patient was  given 650 mg p.o. Tylenol.  He will be admitted to a medical bed for further evaluation and management.   Assessment and Plan:  # Acute on chronic low back pain This is associated with right-sided sciatica. S/p 1 dose of Decadron for now. Continue pain management prn Neurosurgery consulted, recommended symptomatic treatment, okay to work with PT and OT, no urgent surgical intervention.  Patient needs workup for malignancy. Related care consulted, started fentanyl patch.  # RUL Lung Mass Pt was following at [facility] and he was diagnosed with lung mass in October 2024 but he did not pursue for any workup and continued smoking. CT Chest: Large lobular mass in the right upper lobe measuring 6 x 4.3 x 4.8 cm inseparable from the adjacent mediastinum and suprahilar region with postobstructive pneumonia pneumonitis and bronchiectasis.  Findings are highly suspicious for a bronchogenic carcinoma. *Mediastinal adenopathy. *Small right pleural effusion. *8 mm nodule in the left lower lobe anterior segment. Discussed with pulmonologist, patient needs PET scan as an outpatient and follow-up with oncologist.   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 Orlie Dakin, recommended PET scan and biopsy as an outpatient.  Patient is not sure about moving forward for treatment plan yet, but he agreed for PET scan and biopsy.    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 - We will continue Neurontin as mentioned above.   # Peripheral neuropathy associated with right-sided sciatica. continue Neurontin.   #  Dyslipidemia continue statin therapy.   # Essential hypertension continue antihypertensive therapy.   # Hematuria, unknown cause CT A/p shows bilateral renal cyst and chronic atrophy of left kidney  UA shows hematuria. urine culture NGTD Monitor H&H  Goals of care discussion, agreed for DNR/DNI Palliative care consulted for goals of care discussion, overall prognosis is poor.  Patient may need comfort care and hospice down the road.   Body mass index is 25.67 kg/m.  Interventions:  Diet: Heart healthy/carb modified diet DVT Prophylaxis: Subcutaneous Lovenox   Advance goals of care discussion: DNR-limited  Family Communication: family was not present at bedside, at the time of interview.  The pt provided permission to discuss medical plan with the family. Opportunity was given to ask question and all questions were answered satisfactorily.   Disposition:  Pt is from Home, admitted with Back pain and Lung mass with metastatic lesions.  Clinically stable, medically optimized to discharge to SNF when bed will be available. Patient needs PET scan and biopsy as an outpatient TOC following for discharge planning   Subjective: No significant events overnight, still has mild shortness of breath, requiring 2 L oxygen via nasal cannula for comfort.  Pain is under control.  Denied any other complaints.   Physical Exam: General: NAD, lying comfortably Appear in no distress, affect appropriate Eyes: PERRLA ENT: Oral Mucosa Clear, moist  Neck: no JVD,  Cardiovascular: S1 and S2 Present, no Murmur,  Respiratory: good respiratory effort, Bilateral Air entry equal and Decreased, no Crackles, no wheezes Abdomen: Bowel Sound present, Soft and no tenderness,  Skin: no rashes Extremities: no Pedal edema, no calf tenderness Neurologic: without any new focal findings Gait not checked due to patient safety concerns  Vitals:   04/04/23 2126 04/05/23 0548 04/05/23 0750 04/05/23 1554  BP:  139/68 (!) 146/74 (!) 144/62 122/63  Pulse: 69 60 65 64  Resp: 15 18 17 19   Temp: 97.8 F (36.6 C) 98.3 F (36.8 C) 97.9 F (36.6 C) 98.4 F (36.9 C)  TempSrc: Oral Oral Oral Oral  SpO2: 97% 97% 97% 98%  Weight:      Height:        Intake/Output Summary (Last 24 hours) at 04/05/2023 1620 Last data filed at 04/05/2023 1200 Gross per 24 hour  Intake 477 ml  Output 575 ml  Net -98 ml   Filed Weights   04/03/23 2017  Weight: 78.8 kg    Data Reviewed: I have personally reviewed and interpreted daily labs, tele strips, imagings as discussed above. I reviewed all nursing notes, pharmacy notes, vitals, pertinent old records I have discussed plan of care as described above with RN and patient/family.  CBC: Recent Labs  Lab 04/01/23 0525 04/02/23 0236 04/03/23 0548 04/04/23 0511 04/05/23 0436  WBC 5.8 9.3 7.4 7.2 7.4  HGB 13.4 13.3 13.3 13.1 13.3  HCT 43.0 42.4 43.9 42.0 43.0  MCV 84.8 84.5 86.2 85.2 85.0  PLT 361 330 309 290 288   Basic Metabolic Panel: Recent Labs  Lab 04/01/23 0525 04/02/23 0236 04/03/23 0548 04/04/23 0511 04/05/23 0436  NA 140 138 142 137 137  K 4.2 3.2* 4.7 4.6 4.3  CL 105 103 107 102 106  CO2 26 28 29 28 23   GLUCOSE 135* 123* 112* 100*  113*  BUN 18 24* 21 24* 22  CREATININE 0.88 0.95 0.84 0.91 0.70  CALCIUM 9.1 8.6* 8.7* 9.0 8.7*  MG  --  2.2 2.4 2.4  --   PHOS  --  3.2 3.5 3.2  --     Studies: No results found.   Scheduled Meds:  enoxaparin (LOVENOX) injection  40 mg Subcutaneous Q24H   fentaNYL  1 patch Transdermal Q72H   lidocaine  1 patch Transdermal Q24H   metoprolol tartrate  25 mg Oral BID   pantoprazole  40 mg Oral Daily   polyethylene glycol  17 g Oral Daily   senna  1 tablet Oral Daily   umeclidinium-vilanterol  1 puff Inhalation Daily   Continuous Infusions:   PRN Meds: acetaminophen **OR** acetaminophen, albuterol, alum & mag hydroxide-simeth, hydrALAZINE **OR** hydrALAZINE, magnesium hydroxide, morphine  injection, ondansetron **OR** ondansetron (ZOFRAN) IV, oxyCODONE, traZODone  Time spent: 40 minutes  Author: Gillis Santa. MD [facility] 04/05/2023 4:20 PM  To reach On-call, see care teams to locate the attending and reach out to them via [email or web address]. If 7PM-7AM, please contact night-coverage If you still have difficulty reaching the attending provider, please page the [facility] (Director on Call) for [facility] on amion for assistance.

## 2023-04-05 NOTE — TOC Progression Note (Signed)
 Transition of Care [facility]) - Progression Note    Patient Details  Name: MORRIE DAYWALT MRN: [id number] Date of Birth: July 30, 1945  [facility] CM/SW Contact  Marlowe Sax, RN Phone Number: 04/05/2023, 1:48 PM  Clinical Narrative:    Patient will need to go to STR, Bed search pending and will need Ins auth once bed is available   Expected Discharge Plan: Skilled Nursing Facility Barriers to Discharge: SNF Pending bed offer, Insurance Authorization  Expected Discharge Plan and Services   Discharge Planning Services: CM Consult   Living arrangements for the past 2 months: Single Family Home                   DME Agency: NA       HH Arranged: NA           Social Determinants of Health (SDOH) Interventions SDOH Screenings   Food Insecurity: No Food Insecurity (04/03/2023)   Received from [facility]  Housing: Low Risk  (04/03/2023)   Received from [facility]  Transportation Needs: No Transportation Needs (04/03/2023)   Received from [facility]  Utilities: Not At Risk (04/03/2023)   Received from [facility]  Financial Resource Strain: Low Risk  (04/03/2023)   Received from [facility]  Social Connections: Moderately Isolated (03/31/2023)  Tobacco Use: High Risk (03/31/2023)    Readmission Risk Interventions     No data to display

## 2023-04-05 NOTE — Plan of Care (Signed)
 Patient alert and oriented. Medicated for pain with minimal relief. Patient slept well. Will continue to monitor.   Problem: Education: Goal: Knowledge of General Education information will improve Description: Including pain rating scale, medication(s)/side effects and non-pharmacologic comfort measures Outcome: Progressing   Problem: Health Behavior/Discharge Planning: Goal: Ability to manage health-related needs will improve Outcome: Progressing   Problem: Clinical Measurements: Goal: Ability to maintain clinical measurements within normal limits will improve Outcome: Progressing Goal: Will remain free from infection Outcome: Progressing Goal: Diagnostic test results will improve Outcome: Progressing Goal: Respiratory complications will improve Outcome: Progressing Goal: Cardiovascular complication will be avoided Outcome: Progressing   Problem: Nutrition: Goal: Adequate nutrition will be maintained Outcome: Progressing   Problem: Coping: Goal: Level of anxiety will decrease Outcome: Progressing   Problem: Elimination: Goal: Will not experience complications related to bowel motility Outcome: Progressing Goal: Will not experience complications related to urinary retention Outcome: Progressing   Problem: Safety: Goal: Ability to remain free from injury will improve Outcome: Progressing   Problem: Skin Integrity: Goal: Risk for impaired skin integrity will decrease Outcome: Progressing

## 2023-04-05 NOTE — Progress Notes (Signed)
   04/05/23 1400  Spiritual Encounters  Type of Visit Follow up  Care provided to: Pt and family  Reason for visit Advance directives  OnCall Visit No   Patient is pleasant and welcoming.  Advanced Directive information confirmed and completed with Notary and two witnesses. Chaplain spiritual support services remain available as the need arises.

## 2023-04-05 NOTE — [place] FL2 (Signed)
 [place] MEDICAID FL2 LEVEL OF CARE FORM     IDENTIFICATION  Patient Name: Cameron Howe Birthdate: [birth year] Sex: male Admission Date (Current Location): 03/31/2023  [facility] and [place] Number:  Chiropodist and Address:  [facility], [address], [place], [place] [address]      Provider Number: [id number]  Attending Physician Name and Address:  Gillis Santa, MD  Relative Name and Phone Number:  Chad Cordial  Sister  Emergency Contact  [phone]  [address]  [place] [address]    Current Level of Care: Hospital Recommended Level of Care: Skilled Nursing Facility Prior Approval Number:    Date Approved/Denied:   PASRR Number: [id number]  Discharge Plan: SNF    Current Diagnoses: Patient Active Problem List   Diagnosis Date Noted   Palliative care encounter 04/03/2023   Chronic low back pain with sciatica 04/01/2023   Mass of right lung 04/01/2023   Mediastinal adenopathy 04/01/2023   Acute on chronic low back pain 03/31/2023   Essential hypertension 03/31/2023   Dyslipidemia 03/31/2023   Peripheral neuropathy 03/31/2023   Type 2 diabetes mellitus with peripheral neuropathy (HCC) 03/31/2023   Central venous catheter in place, temporary 10/22/2020   Malignant neoplasm of sigmoid colon (HCC) 07/30/2015   Chronic obstructive pulmonary disease (HCC) 11/01/2014   H/O disease 11/01/2014   BP (high blood pressure) 11/01/2014   Type 2 diabetes mellitus (HCC) 11/01/2014   Current tobacco use 10/06/2014   H/O malignant neoplasm of colon 11/11/2010    Orientation RESPIRATION BLADDER Height & Weight     Self, Time, Situation, Place  O2 (2 liters) Incontinent Weight: 78.8 kg Height:  5\' 9"  (175.3 cm)  BEHAVIORAL SYMPTOMS/MOOD NEUROLOGICAL BOWEL NUTRITION STATUS      Continent Diet (carb modified/ heart healthy)  AMBULATORY STATUS COMMUNICATION OF NEEDS Skin   Extensive Assist Verbally Normal                        Personal Care Assistance Level of Assistance  Bathing, Dressing Bathing Assistance: Limited assistance   Dressing Assistance: Limited assistance     Functional Limitations Info  Sight, Hearing, Speech Sight Info: Adequate Hearing Info: Adequate Speech Info: Adequate    SPECIAL CARE FACTORS FREQUENCY  PT (By licensed PT), OT (By licensed OT)     PT Frequency: 5 times per week OT Frequency: 5 times per week            Contractures Contractures Info: Not present    Additional Factors Info  Code Status, Allergies Code Status Info: DNR Allergies Info: NKDA           Current Medications (04/05/2023):  This is the current hospital active medication list Current Facility-Administered Medications  Medication Dose Route Frequency Provider Last Rate Last Admin   acetaminophen (TYLENOL) tablet 650 mg  650 mg Oral Q6H PRN Mansy, Jan A, MD   650 mg at 04/05/23 0856   Or   acetaminophen (TYLENOL) suppository 650 mg  650 mg Rectal Q6H PRN Mansy, Jan A, MD       albuterol (VENTOLIN HFA) 108 (90 Base) MCG/ACT inhaler 2 puff  2 puff Inhalation Q6H PRN Salena Saner, MD       alum & mag hydroxide-simeth (MAALOX/MYLANTA) 200-200-20 MG/5ML suspension 15 mL  15 mL Oral Q6H PRN Gillis Santa, MD       enoxaparin (LOVENOX) injection 40 mg  40 mg Subcutaneous Q24H Mansy, Jan  A, MD   40 mg at 04/05/23 0855   fentaNYL (DURAGESIC) 12 MCG/HR 1 patch  1 patch Transdermal Q72H Theotis Burrow, NP   1 patch at 04/02/23 1335   hydrALAZINE (APRESOLINE) tablet 50 mg  50 mg Oral Q6H PRN Gillis Santa, MD       Or   hydrALAZINE (APRESOLINE) injection 10 mg  10 mg Intravenous Q6H PRN Gillis Santa, MD       lidocaine (LIDODERM) 5 % 1 patch  1 patch Transdermal Q24H Mansy, Jan A, MD   1 patch at 04/04/23 2112   magnesium hydroxide (MILK OF MAGNESIA) suspension 30 mL  30 mL Oral Daily PRN Mansy, Jan A, MD       metoprolol tartrate (LOPRESSOR) tablet 25 mg  25 mg Oral BID Gillis Santa, MD   25 mg  at 04/05/23 0856   morphine (PF) 2 MG/ML injection 2 mg  2 mg Intravenous Q4H PRN Theotis Burrow, NP   2 mg at 04/04/23 1436   ondansetron (ZOFRAN) tablet 4 mg  4 mg Oral Q6H PRN Mansy, Jan A, MD       Or   ondansetron [facility]) injection 4 mg  4 mg Intravenous Q6H PRN Mansy, Jan A, MD   4 mg at 04/02/23 1429   oxyCODONE (Oxy IR/ROXICODONE) immediate release tablet 5 mg  5 mg Oral Q4H PRN Gillis Santa, MD   5 mg at 04/05/23 0856   pantoprazole (PROTONIX) EC tablet 40 mg  40 mg Oral Daily Gillis Santa, MD   40 mg at 04/05/23 0856   polyethylene glycol (MIRALAX / GLYCOLAX) packet 17 g  17 g Oral Daily Borders, Daryl Eastern, NP   17 g at 04/05/23 0856   senna (SENOKOT) tablet 8.6 mg  1 tablet Oral Daily Borders, Daryl Eastern, NP   8.6 mg at 04/05/23 0856   traZODone (DESYREL) tablet 25 mg  25 mg Oral QHS PRN Mansy, Jan A, MD       umeclidinium-vilanterol (ANORO ELLIPTA) 62.5-25 MCG/ACT 1 puff  1 puff Inhalation Daily Salena Saner, MD   1 puff at 04/05/23 1610   Facility-Administered Medications Ordered in Other Encounters  Medication Dose Route Frequency Provider Last Rate Last Admin   sodium chloride flush (NS) 0.9 % injection 10 mL  10 mL Intravenous PRN Jeralyn Ruths, MD   10 mL at 02/16/18 9604     Discharge Medications: Please see discharge summary for a list of discharge medications.  Relevant Imaging Results:  Relevant Lab Results:   Additional Information [id number]  Marlowe Sax, RN

## 2023-04-06 DIAGNOSIS — M545 Low back pain, unspecified: Secondary | ICD-10-CM | POA: Diagnosis not present

## 2023-04-06 DIAGNOSIS — G8929 Other chronic pain: Secondary | ICD-10-CM | POA: Diagnosis not present

## 2023-04-06 LAB — CBC
HCT: 43.1 % (ref 39.0–52.0)
Hemoglobin: 13.6 g/dL (ref 13.0–17.0)
MCH: 26.3 pg (ref 26.0–34.0)
MCHC: 31.6 g/dL (ref 30.0–36.0)
MCV: 83.4 fL (ref 80.0–100.0)
Platelets: 304 10*3/uL (ref 150–400)
RBC: 5.17 MIL/uL (ref 4.22–5.81)
RDW: 16.8 % — ABNORMAL HIGH (ref 11.5–15.5)
WBC: 8.4 10*3/uL (ref 4.0–10.5)
nRBC: 0 % (ref 0.0–0.2)

## 2023-04-06 LAB — BASIC METABOLIC PANEL WITH GFR
Anion gap: 7 (ref 5–15)
BUN: 25 mg/dL — ABNORMAL HIGH (ref 8–23)
CO2: 27 mmol/L (ref 22–32)
Calcium: 8.6 mg/dL — ABNORMAL LOW (ref 8.9–10.3)
Chloride: 104 mmol/L (ref 98–111)
Creatinine, Ser: 0.74 mg/dL (ref 0.61–1.24)
GFR, Estimated: >60 mL/min (ref >60)
Glucose, Bld: 107 mg/dL — ABNORMAL HIGH (ref 70–99)
Potassium: 4.3 mmol/L (ref 3.5–5.1)
Sodium: 138 mmol/L (ref 135–145)

## 2023-04-06 MED ORDER — BISACODYL 5 MG PO TBEC
10.0000 mg | DELAYED_RELEASE_TABLET | Freq: Every day | ORAL | Status: DC
  Administered 2023-04-10: 10 mg via ORAL
  Filled 2023-04-06: qty 2

## 2023-04-06 MED ORDER — BISACODYL 10 MG RE SUPP
10.0000 mg | Freq: Every day | RECTAL | Status: DC | PRN
  Administered 2023-04-07: 10 mg via RECTAL
  Filled 2023-04-06: qty 1

## 2023-04-06 MED ORDER — POLYETHYLENE GLYCOL 3350 17 G PO PACK
17.0000 g | PACK | Freq: Two times a day (BID) | ORAL | Status: DC
Start: 2023-04-06 — End: 2023-04-10
  Administered 2023-04-06 – 2023-04-07 (×3): 17 g via ORAL
  Filled 2023-04-06 (×3): qty 1

## 2023-04-06 MED ORDER — BISACODYL 5 MG PO TBEC
10.0000 mg | DELAYED_RELEASE_TABLET | Freq: Once | ORAL | Status: AC
  Administered 2023-04-06: 10 mg via ORAL
  Filled 2023-04-06: qty 2

## 2023-04-06 MED ORDER — PROCHLORPERAZINE EDISYLATE 10 MG/2ML IJ SOLN
10.0000 mg | Freq: Once | INTRAMUSCULAR | Status: AC
  Administered 2023-04-06: 10 mg via INTRAVENOUS
  Filled 2023-04-06: qty 2

## 2023-04-06 NOTE — TOC Progression Note (Addendum)
 Transition of Care [facility]) - Progression Note    Patient Details  Name: Cameron Howe MRN: [id number] Date of Birth: 18-Apr-1945  [facility] CM/SW Contact  Marlowe Sax, RN Phone Number: 04/06/2023, 1:53 PM  Clinical Narrative:    Met with the patient and reviewed the bed offers and star rating for STR, [facility], Ins needed once updated Therapy notes are in   Expected Discharge Plan: Skilled Nursing Facility Barriers to Discharge: SNF Pending bed offer, Insurance Authorization  Expected Discharge Plan and Services   Discharge Planning Services: CM Consult   Living arrangements for the past 2 months: Single Family Home                   DME Agency: NA       HH Arranged: NA           Social Determinants of Health (SDOH) Interventions SDOH Screenings   Food Insecurity: No Food Insecurity (04/03/2023)   Received from [facility]  Housing: Low Risk  (04/03/2023)   Received from [facility]  Transportation Needs: No Transportation Needs (04/03/2023)   Received from [facility]  Utilities: Not At Risk (04/03/2023)   Received from [facility]  Financial Resource Strain: Low Risk  (04/03/2023)   Received from [facility]  Social Connections: Moderately Isolated (03/31/2023)  Tobacco Use: High Risk (03/31/2023)    Readmission Risk Interventions     No data to display

## 2023-04-06 NOTE — Progress Notes (Signed)
 [facility] Progress Note  Patient: Cameron Howe    JXB:[id number]  DOA: 03/31/2023     Date of Service: the patient was seen and examined on 04/06/2023  Chief Complaint  Patient presents with   Leg Pain    Chronic sciatica    Brief hospital course: Cameron Howe is a 78 y.o. male with medical history significant for osteoarthritis, COPD, type 2 diabetes mellitus, essential, hypertension and tobacco abuse, who presented to the emergency room with acute onset of right-sided sciatica-like pain that has been significantly worsening lately giving him trouble with ambulation.  He was able to get from bed to wheelchair and currently is not able to ambulate or sit in his wheelchair.  He is not able to perform his ADLs secondarily.  His pain has been going on over the last 6 years.  No fever or chills.  No nausea or vomiting or abdominal pain.  No chest pain or palpitations.  No cough or wheezing or dyspnea.  No other paresthesias or focal muscle weakness.   ED Course: When the patient  came to the ER, BP was 172/75 with otherwise normal vital signs.  Labs revealed albumin of 2.9 with otherwise unremarkable CMP.  CBC was within normal. 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 *3.8 x 2.7 cm in maximum diameter bone lesion, hyperintense T2 weighted images mass which appears to be projecting within the posteromedial aspect of the left gluteus muscle against the adjacent iliac posterosuperior spine there is no enhancement after the intravenous administration of contrast material. This is a new lesion since prior examination. Significance is unclear, perhaps ultrasound-guided aspiration may provide additional diagnostic information. If lesion can not be seen on ultrasound consider CT-guided biopsy.   The patient was  given 650 mg p.o. Tylenol.  He will be admitted to a medical bed for further evaluation and management.   Assessment and Plan:  # Acute on chronic low back pain This is associated with right-sided sciatica. S/p 1 dose of Decadron for now. Continue pain management prn Neurosurgery consulted, recommended symptomatic treatment, okay to work with PT and OT, no urgent surgical intervention.  Patient needs workup for malignancy. Related care consulted, started fentanyl patch.  # RUL Lung Mass Pt was following at [facility] and he was diagnosed with lung mass in October 2024 but he did not pursue for any workup and continued smoking. CT Chest: Large lobular mass in the right upper lobe measuring 6 x 4.3 x 4.8 cm inseparable from the adjacent mediastinum and suprahilar region with postobstructive pneumonia pneumonitis and bronchiectasis.  Findings are highly suspicious for a bronchogenic carcinoma. *Mediastinal adenopathy. *Small right pleural effusion. *8 mm nodule in the left lower lobe anterior segment. Discussed with pulmonologist, patient needs PET scan as an outpatient and follow-up with oncologist.   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 Orlie Dakin, recommended PET scan and biopsy as an outpatient.  Patient is not sure about moving forward for treatment plan yet, but he agreed for PET scan and biopsy.    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 - We will continue Neurontin as mentioned above.   # Peripheral neuropathy associated with right-sided sciatica. continue Neurontin.   #  Dyslipidemia continue statin therapy.   # Essential hypertension continue antihypertensive therapy.   # Hematuria, unknown cause CT A/p shows bilateral renal cyst and chronic atrophy of left kidney  UA shows hematuria. urine culture NGTD Monitor H&H  Goals of care discussion, agreed for DNR/DNI Palliative care consulted for goals of care discussion, overall prognosis is poor.  Patient may need comfort care and hospice down the road.   Body mass index is 25.67 kg/m.  Interventions:  Diet: Heart healthy/carb modified diet DVT Prophylaxis: Subcutaneous Lovenox   Advance goals of care discussion: DNR-limited  Family Communication: family was not present at bedside, at the time of interview.  The pt provided permission to discuss medical plan with the family. Opportunity was given to ask question and all questions were answered satisfactorily.   Disposition:  Pt is from Home, admitted with Back pain and Lung mass with metastatic lesions.  Clinically stable, medically optimized to discharge to SNF when bed will be available. Patient needs PET scan and biopsy as an outpatient TOC following for discharge planning   Subjective: No significant events overnight, complaining of back pain 6/10, well-controlled.  Patient with feeling nausea in the morning, denied any vomiting.  No chest pain or palpitation, shortness of breath distal at baseline, requiring 2 L oxygen via nasal cannula,   Physical Exam: General: NAD, lying comfortably Appear in no distress, affect appropriate Eyes: PERRLA ENT: Oral Mucosa Clear, moist  Neck: no JVD,  Cardiovascular: S1 and S2 Present, no Murmur,  Respiratory: good respiratory effort, Bilateral Air entry equal and Decreased, no Crackles, no wheezes Abdomen: Bowel Sound present, Soft and no tenderness,  Skin: no rashes Extremities: no Pedal edema, no calf tenderness Neurologic: without any new focal findings Gait not checked due to patient safety  concerns  Vitals:   04/05/23 1554 04/05/23 2034 04/06/23 0426 04/06/23 0901  BP: 122/63 136/62 137/77 137/68  Pulse: 64 81 89 81  Resp: 19 19 18 17   Temp: 98.4 F (36.9 C) (!) 97.4 F (36.3 C) 98.1 F (36.7 C) (!) 97.3 F (36.3 C)  TempSrc: Oral     SpO2: 98% 100% 99% 100%  Weight:      Height:        Intake/Output Summary (Last 24 hours) at 04/06/2023 1613 Last data filed at 04/06/2023 4098 Gross per 24 hour  Intake --  Output 1650 ml  Net -1650 ml   Filed Weights   04/03/23 2017  Weight: 78.8 kg    Data Reviewed: I have personally reviewed and interpreted daily labs, tele strips, imagings as discussed above. I reviewed all nursing notes, pharmacy notes, vitals, pertinent old records I have discussed plan of care as described above with RN and patient/family.  CBC: Recent Labs  Lab 04/02/23 0236 04/03/23 0548 04/04/23 0511 04/05/23 0436 04/06/23 0436  WBC 9.3 7.4 7.2 7.4 8.4  HGB 13.3 13.3 13.1 13.3 13.6  HCT 42.4 43.9 42.0 43.0 43.1  MCV 84.5 86.2 85.2 85.0 83.4  PLT 330 309 290 288 304   Basic Metabolic Panel: Recent Labs  Lab 04/02/23 0236 04/03/23 0548 04/04/23 0511 04/05/23 0436 04/06/23 0436  NA 138 142 137 137 138  K 3.2* 4.7 4.6 4.3 4.3  CL 103 107 102 106 104  CO2 28  29 28 23 27   GLUCOSE 123* 112* 100* 113* 107*  BUN 24* 21 24* 22 25*  CREATININE 0.95 0.84 0.91 0.70 0.74  CALCIUM 8.6* 8.7* 9.0 8.7* 8.6*  MG 2.2 2.4 2.4  --   --   PHOS 3.2 3.5 3.2  --   --     Studies: No results found.   Scheduled Meds:  enoxaparin (LOVENOX) injection  40 mg Subcutaneous Q24H   fentaNYL  1 patch Transdermal Q72H   lidocaine  1 patch Transdermal Q24H   metoprolol tartrate  25 mg Oral BID   pantoprazole  40 mg Oral Daily   polyethylene glycol  17 g Oral Daily   senna  1 tablet Oral Daily   umeclidinium-vilanterol  1 puff Inhalation Daily   Continuous Infusions:   PRN Meds: acetaminophen **OR** acetaminophen, albuterol, alum & mag  hydroxide-simeth, hydrALAZINE **OR** hydrALAZINE, magnesium hydroxide, morphine injection, ondansetron **OR** ondansetron (ZOFRAN) IV, oxyCODONE, traZODone  Time spent: 40 minutes  Author: Gillis Santa. MD [facility] 04/06/2023 4:13 PM  To reach On-call, see care teams to locate the attending and reach out to them via [email or web address]. If 7PM-7AM, please contact night-coverage If you still have difficulty reaching the attending provider, please page the [facility] (Director on Call) for [facility] on amion for assistance.

## 2023-04-06 NOTE — Progress Notes (Signed)
 PT Cancellation Note  Patient Details Name: Cameron Howe MRN: [id number] DOB: Sep 18, 1945   Cancelled Treatment:    Reason Eval/Treat Not Completed: Other (comment).  Attempted to schedule PT session (with pt and pt's nurse) and have nursing pre-medicate pt for afternoon therapy session (for pain control) but pt reporting being too nauseas to participate in therapy at scheduled time (nurse aware and recently gave pt nausea medication); pt reporting 7/10 pain (pt indicating low back) at rest but declining pain medication (nurse present and aware).  Will re-attempt PT session at a later date/time.  Hendricks Limes, PT 04/06/23, 2:35 PM

## 2023-04-06 NOTE — Plan of Care (Signed)
  Problem: Education: Goal: Knowledge of General Education information will improve Description: Including pain rating scale, medication(s)/side effects and non-pharmacologic comfort measures Outcome: Progressing   Problem: Health Behavior/Discharge Planning: Goal: Ability to manage health-related needs will improve Outcome: Progressing   Problem: Clinical Measurements: Goal: Ability to maintain clinical measurements within normal limits will improve Outcome: Progressing Goal: Will remain free from infection Outcome: Progressing Goal: Diagnostic test results will improve Outcome: Progressing Goal: Respiratory complications will improve Outcome: Progressing Goal: Cardiovascular complication will be avoided Outcome: Progressing   Problem: Nutrition: Goal: Adequate nutrition will be maintained Outcome: Progressing   Problem: Elimination: Goal: Will not experience complications related to bowel motility Outcome: Progressing Goal: Will not experience complications related to urinary retention Outcome: Progressing   Problem: Pain Managment: Goal: General experience of comfort will improve and/or be controlled Outcome: Progressing   Problem: Safety: Goal: Ability to remain free from injury will improve Outcome: Progressing

## 2023-04-06 NOTE — Progress Notes (Signed)
 OT Cancellation Note  Patient Details Name: Cameron OROURKE MRN: [id number] DOB: Oct 07, 1945   Cancelled Treatment:    Reason Eval/Treat Not Completed: Other (comment). Per staff pt continues to have nausea/vomiting, unable to tolerate pain medication to premedicate for session. Defers therapy at this time. Will hold and continue to follow POC as able.   Kathie Dike, M.S. OTR/L  04/06/23, 3:28 PM  ascom [phone]

## 2023-04-07 ENCOUNTER — Inpatient Hospital Stay

## 2023-04-07 DIAGNOSIS — G8929 Other chronic pain: Secondary | ICD-10-CM | POA: Diagnosis not present

## 2023-04-07 DIAGNOSIS — M545 Low back pain, unspecified: Secondary | ICD-10-CM | POA: Diagnosis not present

## 2023-04-07 LAB — CREATININE, SERUM
Creatinine, Ser: 1.19 mg/dL (ref 0.61–1.24)
GFR, Estimated: >60 mL/min (ref >60)

## 2023-04-07 MED ORDER — IOHEXOL 300 MG/ML  SOLN
30.0000 mL | Freq: Once | INTRAMUSCULAR | Status: AC | PRN
  Administered 2023-04-07: 30 mL via ORAL

## 2023-04-07 MED ORDER — PROCHLORPERAZINE EDISYLATE 10 MG/2ML IJ SOLN
10.0000 mg | Freq: Four times a day (QID) | INTRAMUSCULAR | Status: DC | PRN
  Administered 2023-04-07 (×2): 10 mg via INTRAVENOUS
  Filled 2023-04-07 (×5): qty 2

## 2023-04-07 NOTE — Progress Notes (Signed)
 [facility]  [phone]) [phone] [phone]  ID: Cameron Howe OB: 03-02-1945  MR#: [id number]  AOZ#:[id number]  Patient Care Team: Barbette Reichmann, MD as PCP - General (Internal Medicine) Scot Jun, MD (Inactive) (Gastroenterology) Benita Gutter, RN as Oncology Nurse Navigator Glory Buff, RN as Oncology Nurse Navigator  CHIEF COMPLAINT:  Lung mass, with possible metastatic disease.   INTERVAL HISTORY: Patient continues to have pain which appears to be related to his sciatica as well as decreased performance status.  Plan is to discharge to rehab when appropriate.  Patient offers no further complaints today.  He is still interested in pursuing PET scan plus or minus biopsy upon discharge.  REVIEW OF SYSTEMS:   Review of Systems  Constitutional:  Positive for malaise/fatigue. Negative for fever and weight loss.  Respiratory: Negative.  Negative for cough, hemoptysis and shortness of breath.   Cardiovascular: Negative.  Negative for chest pain and leg swelling.  Gastrointestinal: Negative.  Negative for abdominal pain.  Genitourinary: Negative.  Negative for dysuria.  Musculoskeletal:  Positive for back pain.  Skin:  Positive for rash.  Neurological:  Positive for weakness. Negative for dizziness, focal weakness and headaches.  Psychiatric/Behavioral: Negative.  The patient is not nervous/anxious.     As per HPI. Otherwise, a complete review of systems is negative.  PAST MEDICAL HISTORY: Past Medical History:  Diagnosis Date   Arthritis    SHOULDERS AND JOINTS   Cancer [facility])    Colon cancer 11/2010 with parital resection with chemo and rad tx   COPD (chronic obstructive pulmonary disease) (HCC)    Dental bridge present    REMOVABLE   Diabetes mellitus without complication (HCC)    TYPE 2, DIET CONTROLLED   Elevated PSA    Hypertension    Neuromuscular disorder (HCC)    WEAKNESS AND NUMBNESS HANDS AND FEET, POSSIBLY FROM  CHEMO   Tobacco abuse     PAST SURGICAL HISTORY: Past Surgical History:  Procedure Laterality Date   CERVICAL DISCECTOMY     CERVICAL FUSION     COLON SURGERY  NOV 2012   COLON REMOVED   COLON SURGERY  APRIL 2014   OSTOMY REMOVED   COLONOSCOPY N/A 04/21/2015   Procedure: COLONOSCOPY;  Surgeon: Wallace Cullens, MD;  Location: [facility];  Service: Gastroenterology;  Laterality: N/A;  DIABETIC, DIET CONTROLLED   COLONOSCOPY WITH PROPOFOL N/A 11/07/2018   Procedure: COLONOSCOPY WITH PROPOFOL;  Surgeon: Earline Mayotte, MD;  Location: [facility];  Service: Endoscopy;  Laterality: N/A;   COLOSTOMY REVISION     hx of colostomy     LAMINECTOMY     POLYPECTOMY N/A 04/21/2015   Procedure: POLYPECTOMY;  Surgeon: Wallace Cullens, MD;  Location: [facility];  Service: Gastroenterology;  Laterality: N/A;   TUNNELED VENOUS PORT PLACEMENT      FAMILY HISTORY: History reviewed. No pertinent family history.  ADVANCED DIRECTIVES (Y/N):  @ADVDIR @  HEALTH MAINTENANCE: Social History   Tobacco Use   Smoking status: Every Day    Current packs/day: 0.50    Average packs/day: 0.5 packs/day for 30.0 years (15.0 ttl pk-yrs)    Types: Cigarettes   Smokeless tobacco: Never  Substance Use Topics   Alcohol use: No    Alcohol/week: 0.0 standard drinks of alcohol   Drug use: No     Colonoscopy:  PAP:  Bone density:  Lipid panel:  No Known Allergies  Current Facility-Administered Medications  Medication Dose Route Frequency Provider Last  Rate Last Admin   acetaminophen (TYLENOL) tablet 650 mg  650 mg Oral Q6H PRN Mansy, Jan A, MD   650 mg at 04/06/23 2134   Or   acetaminophen (TYLENOL) suppository 650 mg  650 mg Rectal Q6H PRN Mansy, Jan A, MD       albuterol (VENTOLIN HFA) 108 (90 Base) MCG/ACT inhaler 2 puff  2 puff Inhalation Q6H PRN Salena Saner, MD       alum & mag hydroxide-simeth (MAALOX/MYLANTA) 200-200-20 MG/5ML suspension 15 mL  15 mL Oral Q6H PRN Gillis Santa, MD        bisacodyl (DULCOLAX) EC tablet 10 mg  10 mg Oral QHS Gillis Santa, MD       bisacodyl (DULCOLAX) suppository 10 mg  10 mg Rectal Daily PRN Gillis Santa, MD   10 mg at 04/07/23 1118   enoxaparin (LOVENOX) injection 40 mg  40 mg Subcutaneous Q24H Mansy, Jan A, MD   40 mg at 04/07/23 1046   fentaNYL (DURAGESIC) 12 MCG/HR 1 patch  1 patch Transdermal Q72H Theotis Burrow, NP   1 patch at 04/05/23 1633   hydrALAZINE (APRESOLINE) tablet 50 mg  50 mg Oral Q6H PRN Gillis Santa, MD       Or   hydrALAZINE (APRESOLINE) injection 10 mg  10 mg Intravenous Q6H PRN Gillis Santa, MD       lidocaine (LIDODERM) 5 % 1 patch  1 patch Transdermal Q24H Mansy, Jan A, MD   1 patch at 04/06/23 2118   magnesium hydroxide (MILK OF MAGNESIA) suspension 30 mL  30 mL Oral Daily PRN Mansy, Jan A, MD   30 mL at 04/06/23 1654   metoprolol tartrate (LOPRESSOR) tablet 25 mg  25 mg Oral BID Gillis Santa, MD   25 mg at 04/07/23 1047   morphine (PF) 2 MG/ML injection 2 mg  2 mg Intravenous Q4H PRN Theotis Burrow, NP   2 mg at 04/07/23 1125   ondansetron (ZOFRAN) tablet 4 mg  4 mg Oral Q6H PRN Mansy, Jan A, MD       Or   ondansetron [facility]) injection 4 mg  4 mg Intravenous Q6H PRN Mansy, Jan A, MD   4 mg at 04/07/23 1914   oxyCODONE (Oxy IR/ROXICODONE) immediate release tablet 5 mg  5 mg Oral Q4H PRN Gillis Santa, MD   5 mg at 04/06/23 1847   pantoprazole (PROTONIX) EC tablet 40 mg  40 mg Oral Daily Gillis Santa, MD   40 mg at 04/07/23 1046   polyethylene glycol (MIRALAX / GLYCOLAX) packet 17 g  17 g Oral BID Gillis Santa, MD   17 g at 04/07/23 1046   prochlorperazine (COMPAZINE) injection 10 mg  10 mg Intravenous Q6H PRN Gillis Santa, MD   10 mg at 04/07/23 1046   traZODone (DESYREL) tablet 25 mg  25 mg Oral QHS PRN Mansy, Jan A, MD       umeclidinium-vilanterol (ANORO ELLIPTA) 62.5-25 MCG/ACT 1 puff  1 puff Inhalation Daily Salena Saner, MD   1 puff at 04/07/23 0808   Facility-Administered Medications  Ordered in Other Encounters  Medication Dose Route Frequency Provider Last Rate Last Admin   sodium chloride flush (NS) 0.9 % injection 10 mL  10 mL Intravenous PRN Jeralyn Ruths, MD   10 mL at 02/16/18 0949    OBJECTIVE: Vitals:   04/07/23 0327 04/07/23 0843  BP: 115/62 129/66  Pulse: 89 98  Resp: 18 17  Temp: 98.1 F (  36.7 C) (!) 97.5 F (36.4 C)  SpO2: 100% 97%     Body mass index is 25.67 kg/m.    ECOG FS:3 - Symptomatic, >50% confined to bed  General: Well-developed, well-nourished, no acute distress. Eyes: Pink conjunctiva, anicteric sclera. HEENT: Normocephalic, moist mucous membranes. Lungs: No audible wheezing or coughing. Heart: Regular rate and rhythm. Abdomen: Soft, nontender, no obvious distention. Musculoskeletal: No edema, cyanosis, or clubbing. Neuro: Alert, answering all questions appropriately. Cranial nerves grossly intact. Skin: No rashes or petechiae noted. Psych: Normal affect.   LAB RESULTS:  Lab Results  Component Value Date   NA 138 04/06/2023   K 4.3 04/06/2023   CL 104 04/06/2023   CO2 27 04/06/2023   GLUCOSE 107 (H) 04/06/2023   BUN 25 (H) 04/06/2023   CREATININE 1.19 04/07/2023   CALCIUM 8.6 (L) 04/06/2023   PROT 7.0 03/31/2023   ALBUMIN 2.9 (L) 03/31/2023   AST 24 03/31/2023   ALT 15 03/31/2023   ALKPHOS 93 03/31/2023   BILITOT 0.7 03/31/2023   GFRNONAA >60 04/07/2023   GFRAA >60 02/05/2016    Lab Results  Component Value Date   WBC 8.4 04/06/2023   NEUTROABS 1.5 (L) 10/01/2020   HGB 13.6 04/06/2023   HCT 43.1 04/06/2023   MCV 83.4 04/06/2023   PLT 304 04/06/2023     STUDIES: DG Abd 1 View Result Date: 04/07/2023 CLINICAL DATA:  Vomiting, small-bowel obstruction EXAM: ABDOMEN - 1 VIEW COMPARISON:  CT 04/01/2023 FINDINGS: The stomach remains mildly dilated. Multiple gas-filled mid abdominal bowel loops without overt dilatation. Right perihilar and basilar pulmonary opacities as before. Multilevel thoracolumbar  spondylitic change. Aortoiliac calcified plaque. IMPRESSION: Persistent  gastric dilatation. Electronically Signed   By: Corlis Leak M.D.   On: 04/07/2023 10:38   CT ABDOMEN PELVIS W CONTRAST Result Date: 04/01/2023 CLINICAL DATA:  Right lung mass, metastatic disease/staging workup * Tracking Code: BO * EXAM: CT ABDOMEN AND PELVIS WITH CONTRAST TECHNIQUE: Multidetector CT imaging of the abdomen and pelvis was performed using the standard protocol following bolus administration of intravenous contrast. RADIATION DOSE REDUCTION: This exam was performed according to the departmental dose-optimization program which includes automated exposure control, adjustment of the mA and/or kV according to patient size and/or use of iterative reconstruction technique. CONTRAST:  75mL OMNIPAQUE IOHEXOL 300 MG/ML  SOLN COMPARISON:  CT chest 03/21/2023 and CT abdomen 09/25/2018 FINDINGS: Lower chest: Lobular right pleural thickening at the lung base compatible with exudative or malignant pleural effusion. Enlarged lymph nodes just outside of the pericardium in the chest partially confluent with the pleural rind. Subcarinal node 2.0 cm in short axis on image 5 series 2. Edema tracks in the soft tissues of the right chest and right breast. Elevated right hemidiaphragm. Hepatobiliary: Contracted gallbladder. Nonspecific 8 mm hypodense lesion in the lateral segment left hepatic lobe on image 41 series 5, not present on 09/25/2018. Likely benign but technically nonspecific. Pancreas: Unremarkable Spleen: Unremarkable Adrenals/Urinary Tract: Mild nodularity of the lateral limb right adrenal gland, 1.0 cm in diameter on image 33 series 2, not present on 09/25/2018, early metastatic lesion not excluded. Bilateral renal cysts. No further imaging workup of these lesions is indicated. Chronic atrophy of the left kidney lower pole anteriorly. Stomach/Bowel: The stomach is distended. No obvious proximal obstruction, the pyloric channel is  patent. Direct right inguinal hernia contains the cecum and terminal ileum. No findings of obstruction or strangulation at this time. Anastomotic staple line the distal sigmoid colon region. Vascular/Lymphatic: Atherosclerosis is present, including  aortoiliac atherosclerotic disease. No pathologic adenopathy. Reproductive: Prostatomegaly. Other: Low-grade edema in the mesentery and omentum with trace edema along the right paracolic gutter. Nonspecific presacral edema. Musculoskeletal: Within the upper margin of the left gluteus medius, a 6.0 by 4.2 by 5.8 cm (volume = 77 cm^3) lesion with enhancing margins is present also abutting the posterior margin of the left iliac bone. There is demineralization of the adjacent iliac bone with some associated cortical irregularity and underlying lucency. Possibilities include malignancy with bony and muscular involvement, versus a gluteal abscess with early adjacent osteomyelitis. There is also some abnormal thickening of the adjacent upper iliacus muscle raising the possibility of some extension through the iliac bone to involve the iliacus muscle. Lumbar spondylosis and degenerative disc disease observed causing multilevel impingement. Lytic lesion of the right anterior inter trochanteric portion of the hip, image 96 series 2, about 1.4 cm in diameter, likely metastatic. This was also observed on the dedicated CT of the right hip from 03/16/2023. IMPRESSION: 1. Lobular right pleural thickening at the lung base compatible with exudative or malignant pleural effusion. Enlarged lymph nodes just outside of the pericardium in the chest partially confluent with the pleural rind. 2. Mild nodularity of the lateral limb right adrenal gland, 1.0 cm in diameter, not present on 09/25/2018, early metastatic lesion not excluded. 3. Nonspecific 8 mm hypodense lesion in the lateral segment left hepatic lobe, not present on 09/25/2018. Likely benign but technically nonspecific. 4. Lytic  lesion of the right anterior intertrochanteric portion of the hip, about 1.4 cm in diameter, likely metastatic. This was also observed on the dedicated CT of the right hip from 03/16/2023. 5. Within the upper margin of the left gluteus medius, a 6.0 by 4.2 by 5.8 cm (volume = 77 cc) lesion with enhancing margins is present also abutting the posterior margin of the left iliac bone. There is demineralization of the adjacent iliac bone with some associated cortical irregularity and underlying lucency. Possibilities include malignancy with bony and muscular involvement, versus a gluteal abscess with early adjacent osteomyelitis. There is also some abnormal thickening of the adjacent upper iliacus muscle raising the possibility of some extension through the iliac bone to involve the iliacus muscle. 6. Direct right inguinal hernia contains the cecum and terminal ileum. No findings of obstruction or strangulation at this time. 7. Prostatomegaly. 8. Low-grade edema in the mesentery and omentum with trace edema along the right paracolic gutter. Nonspecific presacral edema. 9. Lumbar spondylosis and degenerative disc disease causing multilevel impingement. 10.  Aortic Atherosclerosis (ICD10-I70.0). Electronically Signed   By: Gaylyn Rong M.D.   On: 04/01/2023 18:34   MR Lumbar Spine W Wo Contrast Result Date: 03/31/2023 CLINICAL DATA:  Chronic low back pain EXAM: MRI LUMBAR SPINE WITHOUT AND WITH CONTRAST TECHNIQUE: Multiplanar and multiecho pulse sequences of the lumbar spine were obtained without and with intravenous contrast. CONTRAST:  7mL GADAVIST GADOBUTROL 1 MMOL/ML IV SOLN COMPARISON:  September 16, 2022 MRI lumbar spine FINDINGS: Segmentation:  Standard. Alignment:  Physiologic. Vertebrae:  No fracture, evidence of discitis, or bone lesion. Conus medullaris and cauda equina: Conus extends to the T12 level. Conus and cauda equina appear normal. Paraspinal and other soft tissues: Negative. Disc levels: T12 -  L1 No disc protrusion. No foraminal stenosis. No central canal stenosis. L1 - L2 narrowing of the disc space with a central, left paracentral spondylitic bulging disc disc herniation bone bridge complex flattening the thecal sac and encroaching particularly the left neural foramina small right lateral  bulging disc encroaching the right neural foramina L2 - L3 severe narrowing of the disc space. With a large central spondylitic disc herniation bone bridge complex extending right and left laterally into both neural foramina producing severe central canal stenosis, thecal sac compression aggravated by the hypertrophic osteoarthritic changes of the posterior elements, ligamentum flavum and hypertrophy of the facet joints. These findings are basically unchanged since prior examination. L3 - L4 severe narrowing of the disc space with a central, right and left paracentral spondylitic disc herniation bone bridge complex flattening the thecal sac and encroaching on both neural foramina, with significant central canal stenosis aggravated by the hypertrophic osteoarthritic changes of the posterior elements similar to prior examination. L4 - L5 narrowing of the disc space with degenerative disc disease spondylitic central left paracentral disc herniation bone bridge complex flattening the thecal sac and encroaching the left neural foramina aggravated by hypertrophic osteoarthritic changes of the left facet joints and ligamentum flavum. L5 - S1 No disc protrusion. No foraminal stenosis. No central canal stenosis. 3.8 by 2.7 cm in maximum diameter bone lesion,, hyperintense T2 weighted images mass which appears to be projecting within the posteromedial aspect of the left gluteus muscle against the adjacent iliac posterosuperior spine there is no enhancement after the intravenous administration of contrast material. This is a new lesion since prior examination. And on these images there is no clear evidence of bone involvement.  Significance is unclear, perhaps ultrasound-guided aspiration may provide additional diagnostic information. If lesion can not be seen on ultrasound consider CT-guided biopsy. No abnormal enhancement after the intravenous administration of contrast material IMPRESSION: *Multilevel degenerative disc disease with spondylitic disc herniations and central canal stenosis as described above. *3.8 x 2.7 cm in maximum diameter bone lesion, hyperintense T2 weighted images mass which appears to be projecting within the posteromedial aspect of the left gluteus muscle against the adjacent iliac posterosuperior spine there is no enhancement after the intravenous administration of contrast material. This is a new lesion since prior examination. Significance is unclear, perhaps ultrasound-guided aspiration may provide additional diagnostic information. If lesion can not be seen on ultrasound consider CT-guided biopsy. Electronically Signed   By: Shaaron Adler M.D.   On: 03/31/2023 10:47   CT CHEST W CONTRAST Result Date: 03/31/2023 CLINICAL DATA:  Mass in the right lung EXAM: CT CHEST WITH CONTRAST TECHNIQUE: Multidetector CT imaging of the chest was performed during intravenous contrast administration. RADIATION DOSE REDUCTION: This exam was performed according to the departmental dose-optimization program which includes automated exposure control, adjustment of the mA and/or kV according to patient size and/or use of iterative reconstruction technique. CONTRAST:  75mL OMNIPAQUE IOHEXOL 300 MG/ML  SOLN COMPARISON:  CT chest September 25, 2018 FINDINGS: Cardiovascular: No significant vascular findings. Normal heart size. No pericardial effusion. Extensive coronary artery calcifications. No pericardial effusions. Mediastinum/Nodes: Retrocaval pretracheal adenopathy measuring 1.9 x 1.6 cm. Pretracheal adenopathy 1.5 cm. Large lobular mass in the right upper lobe measuring 6 by 4.3 by 4.8 cm inseparable from the adjacent  mediastinum and suprahilar region with postobstructive pneumonia pneumonitis and bronchiectasis. Thickening of the adjacent fissure with nodularity. Findings are highly suspicious for a bronchogenic carcinoma. Lungs/Pleura: Small right pleural effusion. Postobstructive pneumonia pneumonitis of the right upper lobe as described above. 8 mm nodule in the left lower lobe anterior segment image 83. Upper Abdomen: No acute abnormality. Musculoskeletal: No chest wall abnormality. No acute or significant osseous findings. IMPRESSION: *Large lobular mass in the right upper lobe measuring 6 x 4.3 x  4.8 cm inseparable from the adjacent mediastinum and suprahilar region with postobstructive pneumonia pneumonitis and bronchiectasis. Findings are highly suspicious for a bronchogenic carcinoma. *Mediastinal adenopathy. *Small right pleural effusion. *8 mm nodule in the left lower lobe anterior segment. Electronically Signed   By: Shaaron Adler M.D.   On: 03/31/2023 10:37   CT Hip Right Wo Contrast Result Date: 03/16/2023 CLINICAL DATA:  Patient fell. Thigh pain. Clinical concern for hip fracture. EXAM: CT OF THE RIGHT HIP WITHOUT CONTRAST TECHNIQUE: Multidetector CT imaging of the right hip was performed according to the standard protocol. Multiplanar CT image reconstructions were also generated. RADIATION DOSE REDUCTION: This exam was performed according to the departmental dose-optimization program which includes automated exposure control, adjustment of the mA and/or kV according to patient size and/or use of iterative reconstruction technique. COMPARISON:  Abdomen and pelvis CT 09/25/2018 FINDINGS: Bones/Joint/Cartilage No right femoral neck fracture. Right superior and inferior pubic rami are intact. Small joint effusion evident. 1.1 x 2.8 x 1.0 cm lucent lesion identified medial cortex of the femoral metaphysis just anterior to the lesser trochanter. This is new in the interval since the 2022 exam. Ligaments Suboptimally  assessed by CT. Muscles and Tendons No evidence for intramuscular or subcutaneous hematoma in the right hip region. No substantial subcutaneous edema. Soft tissues Right groin hernia has been incompletely visualized but contains a segment of colon. No overt complicating features within the visualized portion of the hernia. Prostate gland appears enlarged. IMPRESSION: 1. No acute bony findings in the right hip. No evidence for intramuscular or subcutaneous hematoma. 2. 1.1 x 2.8 x 1.0 cm lucent lesion medial cortex of the femoral metaphysis just anterior to the lesser trochanter. This is new in the interval since the 2022 exam. This is nonspecific and while not overtly concerning, lesion warrants additional imaging follow-up. Nonemergent outpatient MRI of the right hip with and without contrast recommended to further evaluate. 3. Right groin hernia has been incompletely visualized but contains a segment of colon. No overt complicating features within the visualized portion of the hernia. Electronically Signed   By: Kennith Center M.D.   On: 03/16/2023 05:39   DG Femur Min 2 Views Right Result Date: 03/16/2023 CLINICAL DATA:  Status post fall. EXAM: RIGHT FEMUR 2 VIEWS COMPARISON:  None Available. FINDINGS: There is no evidence of fracture or other focal bone lesions. Mild to moderate severity vascular calcification is seen. Soft tissue structures are otherwise unremarkable. IMPRESSION: No acute osseous abnormality. Electronically Signed   By: Aram Candela M.D.   On: 03/16/2023 00:47   DG Ankle Complete Right Result Date: 03/16/2023 CLINICAL DATA:  Status post fall. EXAM: RIGHT ANKLE - COMPLETE 3+ VIEW COMPARISON:  None Available. FINDINGS: There is a small, nondisplaced acute fracture involving the distal tip of the right lateral malleolus. There is no evidence of dislocation. There is no evidence of arthropathy or other focal bone abnormality. Mild diffuse soft tissue swelling is present. IMPRESSION: Small,  nondisplaced acute fracture involving the distal tip of the right lateral malleolus. Electronically Signed   By: Aram Candela M.D.   On: 03/16/2023 00:46    ASSESSMENT:  Lung mass, with possible metastatic disease.   PLAN:    Lung mass, with possible metastatic disease: Unlikely recurrence of patient's colon cancer. Right upper lobe lung mass measuring 6.0 x 4.3 x 4.8 cm on CT scan from March 21, 2023.  Mass was also previously seen at [facility] in fall 2024 at which time patient apparently declined  biopsy or further workup.  Recent CT scan of the abdomen and pelvis revealed a left gluteus lesion concerning for metastatic lesion or possible abscess.  Patient also noted to have several bony lytic lesions as well.  After lengthy discussion with the patient, he is unclear if he will want to pursue treatment but has agreed to have PET scan and consideration of biopsy upon discharge.  No further intervention is needed from an oncology standpoint.  Will arrange imaging and follow-up upon discharge. History of colon cancer: Patient completed treatment nearly 10 years ago.  CEA has increased significantly to 55.4 suggesting possible recurrence, but will need PET scan and biopsy as above to confirm.   Pain: Unclear if related to likely malignancy.  Abdominal and pelvic CT from April 01, 2023 did reveal a 1.4 cm lesion in the right anterior intertrochanteric portion of the hip.  This possibly may be contributing to his pain.  If no improvement by next week, can consider nuclear med bone scan and radiation oncology consultation.  Appreciate palliative care input.    Appreciate consult, will follow.  Jeralyn Ruths, MD   04/07/2023 12:43 PM

## 2023-04-07 NOTE — Plan of Care (Signed)

## 2023-04-07 NOTE — Progress Notes (Signed)
 [facility] Progress Note  Patient: Cameron Howe    OZH:[id number]  DOA: 03/31/2023     Date of Service: the patient was seen and examined on 04/07/2023  Chief Complaint  Patient presents with   Leg Pain    Chronic sciatica    Brief hospital course: DRAYK HUMBARGER is a 78 y.o. male with medical history significant for osteoarthritis, COPD, type 2 diabetes mellitus, essential, hypertension and tobacco abuse, who presented to the emergency room with acute onset of right-sided sciatica-like pain that has been significantly worsening lately giving him trouble with ambulation.  He was able to get from bed to wheelchair and currently is not able to ambulate or sit in his wheelchair.  He is not able to perform his ADLs secondarily.  His pain has been going on over the last 6 years.  No fever or chills.  No nausea or vomiting or abdominal pain.  No chest pain or palpitations.  No cough or wheezing or dyspnea.  No other paresthesias or focal muscle weakness.   ED Course: When the patient  came to the ER, BP was 172/75 with otherwise normal vital signs.  Labs revealed albumin of 2.9 with otherwise unremarkable CMP.  CBC was within normal. 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 *3.8 x 2.7 cm in maximum diameter bone lesion, hyperintense T2 weighted images mass which appears to be projecting within the posteromedial aspect of the left gluteus muscle against the adjacent iliac posterosuperior spine there is no enhancement after the intravenous administration of contrast material. This is a new lesion since prior examination. Significance is unclear, perhaps ultrasound-guided aspiration may provide additional diagnostic information. If lesion can not be seen on ultrasound consider CT-guided biopsy.   The patient was  given 650 mg p.o. Tylenol.  He will be admitted to a medical bed for further evaluation and management.   Assessment and Plan:  # Acute on chronic low back pain This is associated with right-sided sciatica. S/p 1 dose of Decadron for now. Continue pain management prn Neurosurgery consulted, recommended symptomatic treatment, okay to work with PT and OT, no urgent surgical intervention.  Patient needs workup for malignancy. Related care consulted, started fentanyl patch.  # RUL Lung Mass Pt was following at [facility] and he was diagnosed with lung mass in October 2024 but he did not pursue for any workup and continued smoking. CT Chest: Large lobular mass in the right upper lobe measuring 6 x 4.3 x 4.8 cm inseparable from the adjacent mediastinum and suprahilar region with postobstructive pneumonia pneumonitis and bronchiectasis.  Findings are highly suspicious for a bronchogenic carcinoma. *Mediastinal adenopathy. *Small right pleural effusion. *8 mm nodule in the left lower lobe anterior segment. Discussed with pulmonologist, patient needs PET scan as an outpatient and follow-up with oncologist.   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 Orlie Dakin, recommended PET scan and biopsy as an outpatient.  Patient is not sure about moving forward for treatment plan yet, but he agreed for PET scan and biopsy.    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 - We will continue Neurontin as mentioned above.   # Peripheral neuropathy associated with right-sided sciatica. continue Neurontin.   #  Dyslipidemia continue statin therapy.   # Essential hypertension continue antihypertensive therapy.   # Hematuria, unknown cause CT A/p shows bilateral renal cyst and chronic atrophy of left kidney  UA shows hematuria. urine culture NGTD Monitor H&H  # Constipation, started laxatives 3/28, abdominal x-ray does not show any obstruction, gastric distention. CT abdomen and pelvis to rule out obstruction   Goals of care discussion, agreed for DNR/DNI Palliative care consulted for goals of care discussion, overall prognosis is poor.  Patient may need comfort care and hospice down the road.   Body mass index is 25.67 kg/m.  Interventions:  Diet: Heart healthy/carb modified diet DVT Prophylaxis: Subcutaneous Lovenox   Advance goals of care discussion: DNR-limited  Family Communication: family was not present at bedside, at the time of interview.  The pt provided permission to discuss medical plan with the family. Opportunity was given to ask question and all questions were answered satisfactorily.   Disposition:  Pt is from Home, admitted with Back pain and Lung mass with metastatic lesions.  Clinically stable, medically optimized to discharge to SNF when bed will be available. Patient needs PET scan and biopsy as an outpatient TOC following for discharge planning   Subjective: No significant events overnight, pain is well-controlled with medications.  Patient was passing gas till yesterday, not passing gas today, denies any abdominal pain.  Patient had small amount of vomiting today and feeling nausea. Patient agreed for CT scan of abdomen and pelvis to rule out obstruction.   Physical Exam: General: NAD, lying comfortably Appear in no distress, affect appropriate Eyes: PERRLA ENT: Oral Mucosa Clear, moist  Neck: no JVD,  Cardiovascular: S1 and S2 Present, no Murmur,  Respiratory: good respiratory effort, Bilateral Air entry equal and Decreased, no Crackles, no  wheezes Abdomen: Bowel Sound present, Soft and no tenderness, right inguinal hernia, no tenderness noticed. Skin: no rashes Extremities: no Pedal edema, no calf tenderness Neurologic: without any new focal findings Gait not checked due to patient safety concerns  Vitals:   04/07/23 0045 04/07/23 0250 04/07/23 0327 04/07/23 0843  BP:   115/62 129/66  Pulse:   89 98  Resp:   18 17  Temp:   98.1 F (36.7 C) (!) 97.5 F (36.4 C)  TempSrc:      SpO2: 93% 98% 100% 97%  Weight:      Height:        Intake/Output Summary (Last 24 hours) at 04/07/2023 1251 Last data filed at 04/07/2023 1048 Gross per 24 hour  Intake 360 ml  Output 700 ml  Net -340 ml   Filed Weights   04/03/23 2017  Weight: 78.8 kg    Data Reviewed: I have personally reviewed and interpreted daily labs, tele strips, imagings as discussed above. I reviewed all nursing notes, pharmacy notes, vitals, pertinent old records I have discussed plan of care as described above with RN and patient/family.  CBC: Recent Labs  Lab 04/02/23 0236 04/03/23 0548 04/04/23 0511 04/05/23 0436 04/06/23 0436  WBC 9.3 7.4 7.2 7.4 8.4  HGB 13.3 13.3 13.1 13.3 13.6  HCT 42.4 43.9 42.0 43.0 43.1  MCV 84.5 86.2 85.2 85.0 83.4  PLT 330 309 290 288 304   Basic Metabolic Panel: Recent Labs  Lab 04/02/23 0236 04/03/23  2952 04/04/23 0511 04/05/23 0436 04/06/23 0436 04/07/23 0507  NA 138 142 137 137 138  --   K 3.2* 4.7 4.6 4.3 4.3  --   CL 103 107 102 106 104  --   CO2 28 29 28 23 27   --   GLUCOSE 123* 112* 100* 113* 107*  --   BUN 24* 21 24* 22 25*  --   CREATININE 0.95 0.84 0.91 0.70 0.74 1.19  CALCIUM 8.6* 8.7* 9.0 8.7* 8.6*  --   MG 2.2 2.4 2.4  --   --   --   PHOS 3.2 3.5 3.2  --   --   --     Studies: DG Abd 1 View Result Date: 04/07/2023 CLINICAL DATA:  Vomiting, small-bowel obstruction EXAM: ABDOMEN - 1 VIEW COMPARISON:  CT 04/01/2023 FINDINGS: The stomach remains mildly dilated. Multiple gas-filled mid  abdominal bowel loops without overt dilatation. Right perihilar and basilar pulmonary opacities as before. Multilevel thoracolumbar spondylitic change. Aortoiliac calcified plaque. IMPRESSION: Persistent  gastric dilatation. Electronically Signed   By: Corlis Leak M.D.   On: 04/07/2023 10:38     Scheduled Meds:  bisacodyl  10 mg Oral QHS   enoxaparin (LOVENOX) injection  40 mg Subcutaneous Q24H   fentaNYL  1 patch Transdermal Q72H   lidocaine  1 patch Transdermal Q24H   metoprolol tartrate  25 mg Oral BID   pantoprazole  40 mg Oral Daily   polyethylene glycol  17 g Oral BID   umeclidinium-vilanterol  1 puff Inhalation Daily   Continuous Infusions:   PRN Meds: acetaminophen **OR** acetaminophen, albuterol, alum & mag hydroxide-simeth, bisacodyl, hydrALAZINE **OR** hydrALAZINE, magnesium hydroxide, morphine injection, ondansetron **OR** ondansetron (ZOFRAN) IV, oxyCODONE, prochlorperazine, traZODone  Time spent: 55 minutes  Author: Gillis Santa. MD [facility] 04/07/2023 12:51 PM  To reach On-call, see care teams to locate the attending and reach out to them via [email or web address]. If 7PM-7AM, please contact night-coverage If you still have difficulty reaching the attending provider, please page the [facility] (Director on Call) for [facility] on amion for assistance.

## 2023-04-07 NOTE — Progress Notes (Signed)
 Pt declining CT. Pt states he does not feel up to it today; reports he would like to reschedule it for tomorrow. He is aware he will have to drink the contrast again - pt verbalized understanding and is agreeable at this time. Gillis Santa, MD made aware.

## 2023-04-07 NOTE — Care Management Important Message (Signed)
 Important Message  Patient Details  Name: Cameron Howe MRN: [id number] Date of Birth: 1945-01-13   Important Message Given:  Yes - Medicare IM     Bernadette Hoit 04/07/2023, 10:31 AM

## 2023-04-07 NOTE — Progress Notes (Addendum)
 PT Cancellation Note  Patient Details Name: Cameron Howe MRN: [id number] DOB: [birth year]   Cancelled Treatment:     PT attempt. Upon arrival, pt had just vomited on himself. RN staff made aware. Will return at a later time but continue to follow per current POC.   1530 PT attempt. 2nd attempt. Pt endorses still feeling poorly. Acute PT will continue to follow and progress per current POC.    Rushie Chestnut 04/07/2023, 8:57 AM

## 2023-04-07 NOTE — Plan of Care (Signed)
  Problem: Pain Managment: Goal: General experience of comfort will improve and/or be controlled Outcome: Progressing   Problem: Safety: Goal: Ability to remain free from injury will improve Outcome: Progressing   Problem: Skin Integrity: Goal: Risk for impaired skin integrity will decrease Outcome: Progressing

## 2023-04-07 NOTE — Progress Notes (Signed)
 OT Cancellation Note  Patient Details Name: Cameron Howe MRN: [id number] DOB: 1945/02/13   Cancelled Treatment:    Reason Eval/Treat Not Completed: Other (comment). Chart reviewed  - per PT, pt continues with episodes of vomiting and feeling poorly overall. Will hold OT at this time and continue to follow POC as able.  Remonia Otte L. Obert Espindola, OTR/L  04/07/23, 4:26 PM

## 2023-04-08 ENCOUNTER — Inpatient Hospital Stay

## 2023-04-08 DIAGNOSIS — E785 Hyperlipidemia, unspecified: Secondary | ICD-10-CM | POA: Diagnosis not present

## 2023-04-08 DIAGNOSIS — M5441 Lumbago with sciatica, right side: Secondary | ICD-10-CM

## 2023-04-08 DIAGNOSIS — E1142 Type 2 diabetes mellitus with diabetic polyneuropathy: Secondary | ICD-10-CM | POA: Diagnosis not present

## 2023-04-08 DIAGNOSIS — I10 Essential (primary) hypertension: Secondary | ICD-10-CM | POA: Diagnosis not present

## 2023-04-08 LAB — CBC
HCT: 46 % (ref 39.0–52.0)
Hemoglobin: 14 g/dL (ref 13.0–17.0)
MCH: 26.2 pg (ref 26.0–34.0)
MCHC: 30.4 g/dL (ref 30.0–36.0)
MCV: 86.1 fL (ref 80.0–100.0)
Platelets: 268 10*3/uL (ref 150–400)
RBC: 5.34 MIL/uL (ref 4.22–5.81)
RDW: 17 % — ABNORMAL HIGH (ref 11.5–15.5)
WBC: 13.7 10*3/uL — ABNORMAL HIGH (ref 4.0–10.5)
nRBC: 0 % (ref 0.0–0.2)

## 2023-04-08 LAB — BASIC METABOLIC PANEL WITH GFR
Anion gap: 10 (ref 5–15)
BUN: 71 mg/dL — ABNORMAL HIGH (ref 8–23)
CO2: 29 mmol/L (ref 22–32)
Calcium: 8.7 mg/dL — ABNORMAL LOW (ref 8.9–10.3)
Chloride: 102 mmol/L (ref 98–111)
Creatinine, Ser: 1.57 mg/dL — ABNORMAL HIGH (ref 0.61–1.24)
GFR, Estimated: 45 mL/min — ABNORMAL LOW (ref >60)
Glucose, Bld: 138 mg/dL — ABNORMAL HIGH (ref 70–99)
Potassium: 5.5 mmol/L — ABNORMAL HIGH (ref 3.5–5.1)
Sodium: 141 mmol/L (ref 135–145)

## 2023-04-08 LAB — PHOSPHORUS: Phosphorus: 4.8 mg/dL — ABNORMAL HIGH (ref 2.5–4.6)

## 2023-04-08 LAB — MAGNESIUM: Magnesium: 3.2 mg/dL — ABNORMAL HIGH (ref 1.7–2.4)

## 2023-04-08 MED ORDER — DOCUSATE SODIUM 100 MG PO CAPS
100.0000 mg | ORAL_CAPSULE | Freq: Two times a day (BID) | ORAL | Status: DC
  Administered 2023-04-10 – 2023-04-11 (×2): 100 mg via ORAL
  Filled 2023-04-08 (×2): qty 1

## 2023-04-08 NOTE — Progress Notes (Signed)
 Progress Note   Patient: Cameron Howe QMV:[id number] DOB: Oct 18, 1945 DOA: 03/31/2023     8 DOS: the patient was seen and examined on 04/08/2023   Brief hospital course: Cameron Howe is a 78 y.o. male with medical history significant for osteoarthritis, COPD, type 2 diabetes mellitus, essential, hypertension and tobacco abuse, who presented to the emergency room with acute onset of right-sided sciatica-like pain that has been significantly worsening lately giving him trouble with ambulation.  He was able to get from bed to wheelchair and currently is not able to ambulate or sit in his wheelchair.  He is not able to perform his ADLs secondarily.  His pain has been going on over the last 6 years.   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 *3.8 x 2.7 cm in maximum diameter bone lesion, hyperintense T2 weighted images mass which appears to be projecting within the posteromedial aspect of the left gluteus muscle against the adjacent iliac posterosuperior spine there is no enhancement after the intravenous administration of contrast material. This is a new lesion since prior examination.  Patient is admitted to hospitalist service for further management evaluation of acute on chronic low back pain, right upper lobe lung mass, left gluteal, iliac bone, right femur lesion.  Oncology, palliative services consulted.  Assessment and Plan: Acute on chronic low back pain Right-sided sciatica. S/p 1 dose of Decadron for now. Continue pain management prn Neurosurgery consulted, recommended symptomatic treatment, okay to work with PT and OT, no urgent surgical intervention.  Patient needs workup for malignancy. Palliative care consulted, started fentanyl patch.   RUL Lung Mass Pt was following at [facility] and he was diagnosed with lung mass in October 2024 but he  did not pursue for any workup and continued smoking. CT Chest: Large lobular mass in the right upper lobe measuring 6 x 4.3 x 4.8 cm inseparable from the adjacent mediastinum and suprahilar region with postobstructive pneumonia pneumonitis and bronchiectasis.  Findings are highly suspicious for a bronchogenic carcinoma. *Mediastinal adenopathy. *Small right pleural effusion. *8 mm nodule in the left lower lobe anterior segment. Discussed with pulmonologist, patient needs PET scan as an outpatient and follow-up with oncologist.     Left Gluteal Mass, left iliac bone and Right femur lesion, most likely metastatic ds MRI L-spine: mass which appears to be projecting within the posteromedial aspect of the left gluteus muscle against the adjacent iliac posterosuperior spine  CT A/p reviewed, multiple metastatic lesions and right inguinal hernia without obstruction Consulted oncologist Dr. Orlie Dakin, recommended PET scan and biopsy as an outpatient.  Patient is not sure about moving forward for treatment plan yet, but he agreed for PET scan and biopsy.   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 Continue gabapentin.  Dyslipidemia continue statin therapy.   Essential hypertension continue antihypertensive therapy.   Hematuria, unknown cause CT A/p shows bilateral renal cyst and chronic atrophy of left kidney  UA shows hematuria. urine culture NGTD Continue to monitor H&H.   Constipation, started laxatives 3/28, abdominal x-ray does not show any obstruction, gastric distention. CT abdomen and pelvis to rule out obstruction   Goals of care discussion, agreed for DNR/DNI Palliative care to continue goals of care discussion, overall prognosis is poor.       Out of bed to chair. Incentive spirometry. Nursing supportive care. Fall,  aspiration precautions. Diet:  Diet Orders (From admission, onward)     Start     Ordered    04/01/23 1005  Diet heart healthy/carb modified Fluid consistency: Thin  Diet effective now       Question:  Fluid consistency:  Answer:  Thin   04/01/23 1004           DVT prophylaxis: enoxaparin (LOVENOX) injection 40 mg Start: 04/01/23 1000  Level of care: MED-SURG   Code Status: Limited: Do not attempt resuscitation (DNR) -DNR-LIMITED -Do Not Intubate/DNI   Subjective: Patient is seen and examined today morning. He is in pain, restless in the bed. Eating fair. RN notified that he refused to get CT abd/ pelvis, wants to get it tomorrow.  Physical Exam: Vitals:   04/07/23 1547 04/07/23 1949 04/08/23 0546 04/08/23 0846  BP: 115/64 (!) 145/75 (!) 104/59 (!) 132/56  Pulse: 75 (!) 103 62 74  Resp: 18 20 18 18   Temp: 97.7 F (36.5 C) 97.9 F (36.6 C) 97.8 F (36.6 C) (!) 97.4 F (36.3 C)  TempSrc: Oral Oral    SpO2: 98% 98% 100% 91%  Weight:      Height:        General - Elderly African American male, distress due to pain. HEENT - PERRLA, EOMI, atraumatic head, non tender sinuses. Lung - Clear, diffuse rales, no rhonchi, wheezes. Heart - S1, S2 heard, no murmurs, rubs, trace pedal edema. Abdomen - Soft, non tender, bowel sounds good Neuro - Alert, awake and oriented x 3, non focal exam. Skin - Warm and dry.  Data Reviewed:      Latest Ref Rng & Units 04/08/2023    4:03 AM 04/06/2023    4:36 AM 04/05/2023    4:36 AM  CBC  WBC 4.0 - 10.5 K/uL 13.7  8.4  7.4   Hemoglobin 13.0 - 17.0 g/dL 16.1  09.6  04.5   Hematocrit 39.0 - 52.0 % 46.0  43.1  43.0   Platelets 150 - 400 K/uL 268  304  288       Latest Ref Rng & Units 04/08/2023    4:03 AM 04/07/2023    5:07 AM 04/06/2023    4:36 AM  BMP  Glucose 70 - 99 mg/dL 409   811   BUN 8 - 23 mg/dL 71   25   Creatinine 9.14 - 1.24 mg/dL 7.82  9.56  2.13   Sodium 135 - 145 mmol/L 141   138   Potassium 3.5 - 5.1 mmol/L 5.5   4.3   Chloride 98 - 111 mmol/L 102   104   CO2 22 - 32 mmol/L 29   27   Calcium 8.9 - 10.3 mg/dL 8.7    8.6    DG Abd 1 View Result Date: 04/07/2023 CLINICAL DATA:  Vomiting, small-bowel obstruction EXAM: ABDOMEN - 1 VIEW COMPARISON:  CT 04/01/2023 FINDINGS: The stomach remains mildly dilated. Multiple gas-filled mid abdominal bowel loops without overt dilatation. Right perihilar and basilar pulmonary opacities as before. Multilevel thoracolumbar spondylitic change. Aortoiliac calcified plaque. IMPRESSION: Persistent  gastric dilatation. Electronically Signed   By: Corlis Leak M.D.   On: 04/07/2023 10:38    Family Communication: Discussed with patient, he understand and agree. All questions answered.  Disposition: Status is: Inpatient Remains inpatient appropriate because: pain control, oncology work up.  Planned Discharge Destination: Skilled nursing facility     Time spent: 40 minutes  Author: Marcelino Duster, MD 04/08/2023 1:23 PM Secure chat  7am to 7pm For on call review [email or web address].

## 2023-04-09 DIAGNOSIS — M5441 Lumbago with sciatica, right side: Secondary | ICD-10-CM | POA: Diagnosis not present

## 2023-04-09 DIAGNOSIS — E1142 Type 2 diabetes mellitus with diabetic polyneuropathy: Secondary | ICD-10-CM | POA: Diagnosis not present

## 2023-04-09 DIAGNOSIS — I10 Essential (primary) hypertension: Secondary | ICD-10-CM | POA: Diagnosis not present

## 2023-04-09 DIAGNOSIS — E785 Hyperlipidemia, unspecified: Secondary | ICD-10-CM | POA: Diagnosis not present

## 2023-04-09 LAB — CBC
HCT: 44.1 % (ref 39.0–52.0)
Hemoglobin: 13.6 g/dL (ref 13.0–17.0)
MCH: 26.5 pg (ref 26.0–34.0)
MCHC: 30.8 g/dL (ref 30.0–36.0)
MCV: 85.8 fL (ref 80.0–100.0)
Platelets: 253 10*3/uL (ref 150–400)
RBC: 5.14 MIL/uL (ref 4.22–5.81)
RDW: 17 % — ABNORMAL HIGH (ref 11.5–15.5)
WBC: 9.4 10*3/uL (ref 4.0–10.5)
nRBC: 0 % (ref 0.0–0.2)

## 2023-04-09 LAB — BASIC METABOLIC PANEL WITH GFR
Anion gap: 8 (ref 5–15)
BUN: 45 mg/dL — ABNORMAL HIGH (ref 8–23)
CO2: 31 mmol/L (ref 22–32)
Calcium: 9.1 mg/dL (ref 8.9–10.3)
Chloride: 104 mmol/L (ref 98–111)
Creatinine, Ser: 0.98 mg/dL (ref 0.61–1.24)
GFR, Estimated: >60 mL/min (ref >60)
Glucose, Bld: 113 mg/dL — ABNORMAL HIGH (ref 70–99)
Potassium: 5.3 mmol/L — ABNORMAL HIGH (ref 3.5–5.1)
Sodium: 143 mmol/L (ref 135–145)

## 2023-04-09 NOTE — Progress Notes (Signed)
 Progress Note   Patient: Cameron Howe ZOX:[id number] DOB: 01/30/1945 DOA: 03/31/2023     9 DOS: the patient was seen and examined on 04/09/2023   Brief hospital course: LYELL CLUGSTON is a 78 y.o. male with medical history significant for osteoarthritis, COPD, type 2 diabetes mellitus, essential, hypertension and tobacco abuse, who presented to the emergency room with acute onset of right-sided sciatica-like pain that has been significantly worsening lately giving him trouble with ambulation.  He was able to get from bed to wheelchair and currently is not able to ambulate or sit in his wheelchair.  He is not able to perform his ADLs secondarily.  His pain has been going on over the last 6 years.   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 *3.8 x 2.7 cm in maximum diameter bone lesion, hyperintense T2 weighted images mass which appears to be projecting within the posteromedial aspect of the left gluteus muscle against the adjacent iliac posterosuperior spine there is no enhancement after the intravenous administration of contrast material. This is a new lesion since prior examination.  Patient is admitted to hospitalist service for further management evaluation of acute on chronic low back pain, right upper lobe lung mass, left gluteal, iliac bone, right femur lesion.  Oncology, palliative services consulted.  Assessment and Plan: Acute on chronic low back pain Right-sided sciatica. S/p 1 dose of Decadron for now. Continue pain management prn Neurosurgery consulted, recommended symptomatic treatment, okay to work with PT and OT, no urgent surgical intervention.  Patient needs workup for malignancy. Palliative care consulted, started fentanyl patch.   RUL Lung Mass Pt was following at [facility] and he was diagnosed with lung mass in October 2024 but he  did not pursue for any workup and continued smoking. CT Chest: Large lobular mass in the right upper lobe measuring 6 x 4.3 x 4.8 cm inseparable from the adjacent mediastinum and suprahilar region with postobstructive pneumonia pneumonitis and bronchiectasis.  Findings are highly suspicious for a bronchogenic carcinoma. *Mediastinal adenopathy. *Small right pleural effusion. *8 mm nodule in the left lower lobe anterior segment. Discussed with pulmonologist, patient needs PET scan as an outpatient and follow-up with oncologist.    Left Gluteal Mass, left iliac bone and Right femur lesion, most likely metastatic ds MRI L-spine: mass which appears to be projecting within the posteromedial aspect of the left gluteus muscle against the adjacent iliac posterosuperior spine  CT A/p reviewed, multiple metastatic lesions and right inguinal hernia without obstruction. Dr. Orlie Dakin, recommended PET scan and biopsy as an outpatient.  Patient is not sure about moving forward for treatment plan yet, but he agreed for PET scan and biopsy.  Acute kidney injury: Kidney function improved. Avoid nephrotoxic drugs.   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 Continue gabapentin.  Dyslipidemia Continue statin therapy.   Essential hypertension Continue antihypertensive therapy.   Hematuria, unknown cause CT A/p shows bilateral renal cyst and chronic atrophy of left kidney  UA shows hematuria. urine culture NGTD Continue to monitor H&H.   Constipation, started laxatives 3/28, abdominal x-ray does not show any obstruction, gastric distention. He has good bowel movement, canceled CT abdomen/ pelvis.   Goals of care discussion, agreed for DNR/DNI Palliative care to continue goals of care discussion, overall prognosis is poor.       Out of bed  to chair. Incentive spirometry. Nursing supportive care. Fall, aspiration precautions. Diet:   Diet Orders (From admission, onward)     Start     Ordered   04/01/23 1005  Diet heart healthy/carb modified Fluid consistency: Thin  Diet effective now       Question:  Fluid consistency:  Answer:  Thin   04/01/23 1004           DVT prophylaxis: enoxaparin (LOVENOX) injection 40 mg Start: 04/01/23 1000  Level of care: MED-SURG   Code Status: Limited: Do not attempt resuscitation (DNR) -DNR-LIMITED -Do Not Intubate/DNI   Subjective: Patient is seen and examined today morning. His pain better, states he had bowel movement. Does not wish to get CT abdomen. Eating fair.   Physical Exam: Vitals:   04/08/23 1441 04/08/23 2010 04/09/23 0450 04/09/23 0812  BP: 125/72 129/63 134/60 (!) 175/58  Pulse: 62 71 (!) 55 68  Resp: 16 18 18 16   Temp: 99.5 F (37.5 C) 97.7 F (36.5 C) (!) 97.5 F (36.4 C) 98.4 F (36.9 C)  TempSrc:  Oral Oral   SpO2: 99% 97% 97% 96%  Weight:      Height:        General - Elderly African American male, no acute distress. HEENT - PERRLA, EOMI, atraumatic head, non tender sinuses. Lung - Clear, diffuse rales, no rhonchi, wheezes. Heart - S1, S2 heard, no murmurs, rubs, trace pedal edema. Abdomen - Soft, non tender, bowel sounds good Neuro - Alert, awake and oriented x 3, non focal exam. Skin - Warm and dry.  Data Reviewed:      Latest Ref Rng & Units 04/09/2023    8:26 AM 04/08/2023    4:03 AM 04/06/2023    4:36 AM  CBC  WBC 4.0 - 10.5 K/uL 9.4  13.7  8.4   Hemoglobin 13.0 - 17.0 g/dL 16.1  09.6  04.5   Hematocrit 39.0 - 52.0 % 44.1  46.0  43.1   Platelets 150 - 400 K/uL 253  268  304       Latest Ref Rng & Units 04/09/2023    8:26 AM 04/08/2023    4:03 AM 04/07/2023    5:07 AM  BMP  Glucose 70 - 99 mg/dL 409  811    BUN 8 - 23 mg/dL 45  71    Creatinine 9.14 - 1.24 mg/dL 7.82  9.56  2.13   Sodium 135 - 145 mmol/L 143  141    Potassium 3.5 - 5.1 mmol/L 5.3  5.5    Chloride 98 - 111 mmol/L 104  102    CO2 22 - 32 mmol/L 31  29    Calcium  8.9 - 10.3 mg/dL 9.1  8.7     No results found.   Family Communication: Discussed with patient, he understand and agree. All questions answered.  Disposition: Status is: Inpatient Remains inpatient appropriate because: pain control, SNF placement.  Planned Discharge Destination: Skilled nursing facility     Time spent: 39 minutes  Author: Marcelino Duster, MD 04/09/2023 3:13 PM Secure chat 7am to 7pm For on call review [email or web address].

## 2023-04-09 NOTE — Progress Notes (Signed)
 Physical Therapy Treatment Patient Details Name: JAYMESON MENGEL MRN: [id number] DOB: 1945-01-28 Today's Date: 04/09/2023   History of Present Illness Pt is a 78 y.o. male with medical history significant for osteoarthritis, COPD, type 2 diabetes mellitus, essential, hypertension and tobacco abuse, who presented to the emergency room with acute onset of right-sided sciatica-like pain that has been significantly worsening lately giving him trouble with ambulation. MD assessment includes: acute on chronic low back pain, R-sided sciatica, RUL lung mass, left gluteal mass, left iliac bone and right femur lesion, most likely metastatic disease, and hematuria.    PT Comments  Pt requested "one more day" of no mobility secondary to fear of extreme pain with transitioning into sitting/standing from supine and stated that his goal is start moving forward with testing and mobility starting tomorrow. Pt did agree to supine therex per below and was able to tolerate some manual resistance to LLE exercises as well as some low intensity AROM exercises on the RLE.  Pt reported no adverse symptoms during the session other than occasional mild RLE pain with SpO2 and HR WNL throughout on supplemental O2.  Pt will benefit from continued PT services upon discharge to safely address deficits listed in patient problem list for decreased caregiver assistance and eventual return to PLOF.       If plan is discharge home, recommend the following: Two people to help with walking and/or transfers;A lot of help with bathing/dressing/bathroom;Assistance with cooking/housework;Direct supervision/assist for medications management;Assist for transportation;Help with stairs or ramp for entrance   Can travel by private vehicle     No  Equipment Recommendations  Rolling walker (2 wheels)    Recommendations for Other Services       Precautions / Restrictions Precautions Precautions: Fall Recall of Precautions/Restrictions:  Intact Restrictions Weight Bearing Restrictions Per Provider Order: No     Mobility  Bed Mobility               General bed mobility comments: Pt declined all but supine therex this date    Transfers                        Ambulation/Gait                   Stairs             Wheelchair Mobility     Tilt Bed    Modified Rankin (Stroke Patients Only)       Balance                                            Communication Communication Communication: No apparent difficulties  Cognition Arousal: Alert Behavior During Therapy: WFL for tasks assessed/performed   PT - Cognitive impairments: No apparent impairments                         Following commands: Intact      Cueing Cueing Techniques: Verbal cues, Tactile cues  Exercises Total Joint Exercises Ankle Circles/Pumps: AROM, Strengthening, Both, 5 reps, 10 reps (manual resistance on the LLE) Quad Sets: Strengthening, Both, 10 reps, 5 reps, AROM Gluteal Sets: Strengthening, Both, 5 reps, 10 reps Short Arc Quad: Strengthening, Left, 10 reps, 5 reps (with manual resistance) Heel Slides: Strengthening, Left, 5 reps, 10 reps (with manual resistance) Hip ABduction/ADduction: AAROM,  Strengthening, Left, 10 reps Straight Leg Raises: AAROM, Strengthening, Left, 10 reps Other Exercises Other Exercises: LLE leg press with manual resistance x 10    General Comments        Pertinent Vitals/Pain Pain Assessment Pain Assessment: 0-10 Pain Score: 5  Pain Location: R thigh Pain Descriptors / Indicators: Aching, Sore Pain Intervention(s): Repositioned, Premedicated before session, Monitored during session, Limited activity within patient's tolerance    Home Living                          Prior Function            PT Goals (current goals can now be found in the care plan section) Progress towards PT goals: PT to reassess next treatment     Frequency    Min 2X/week      PT Plan      Co-evaluation              AM-PAC PT "6 Clicks" Mobility   Outcome Measure  Help needed turning from your back to your side while in a flat bed without using bedrails?: A Lot Help needed moving from lying on your back to sitting on the side of a flat bed without using bedrails?: Total Help needed moving to and from a bed to a chair (including a wheelchair)?: Total Help needed standing up from a chair using your arms (e.g., wheelchair or bedside chair)?: Total Help needed to walk in hospital room?: Total Help needed climbing 3-5 steps with a railing? : Total 6 Click Score: 7    End of Session   Activity Tolerance: Patient limited by pain Patient left: in bed;with call bell/phone within reach;with bed alarm set Nurse Communication: Mobility status PT Visit Diagnosis: History of falling (Z91.81);Unsteadiness on feet (R26.81);Difficulty in walking, not elsewhere classified (R26.2);Muscle weakness (generalized) (M62.81);Pain Pain - Right/Left: Right Pain - part of body: Leg     Time: 1421-1441 PT Time Calculation (min) (ACUTE ONLY): 20 min  Charges:    $Therapeutic Exercise: 8-22 mins PT General Charges $$ ACUTE PT VISIT: 1 Visit                    D. Scott Destry Bezdek PT, DPT 04/09/23, 2:53 PM

## 2023-04-10 DIAGNOSIS — G8929 Other chronic pain: Secondary | ICD-10-CM | POA: Diagnosis not present

## 2023-04-10 DIAGNOSIS — M545 Low back pain, unspecified: Secondary | ICD-10-CM | POA: Diagnosis not present

## 2023-04-10 MED ORDER — POLYETHYLENE GLYCOL 3350 17 G PO PACK: 17.0000 g | PACK | Freq: Two times a day (BID) | ORAL | Status: DC

## 2023-04-10 MED ORDER — SODIUM ZIRCONIUM CYCLOSILICATE 10 G PO PACK
10.0000 g | PACK | Freq: Three times a day (TID) | ORAL | Status: AC
  Administered 2023-04-10 (×2): 10 g via ORAL
  Filled 2023-04-10 (×3): qty 1

## 2023-04-10 NOTE — Progress Notes (Signed)
 Progress Note   Patient: Cameron Howe:[id number] DOB: Nov 29, 1945 DOA: 03/31/2023     10 DOS: the patient was seen and examined on 04/10/2023   Brief hospital course: Cameron Howe is a 78 y.o. male with medical history significant for osteoarthritis, COPD, type 2 diabetes mellitus, essential, hypertension and tobacco abuse, who presented to the emergency room with acute onset of right-sided sciatica-like pain that has been significantly worsening lately giving him trouble with ambulation.  He was able to get from bed to wheelchair and currently is not able to ambulate or sit in his wheelchair.  He is not able to perform his ADLs secondarily.  His pain has been going on over the last 6 years.   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 *3.8 x 2.7 cm in maximum diameter bone lesion, hyperintense T2 weighted images mass which appears to be projecting within the posteromedial aspect of the left gluteus muscle against the adjacent iliac posterosuperior spine there is no enhancement after the intravenous administration of contrast material. This is a new lesion since prior examination.  Patient is admitted to hospitalist service for further management evaluation of acute on chronic low back pain, right upper lobe lung mass, left gluteal, iliac bone, right femur lesion.  Oncology, palliative services consulted.  Assessment and Plan: Acute on chronic low back pain Right-sided sciatica. S/p 1 dose of Decadron for now. Continue pain management prn Neurosurgery consulted, recommended symptomatic treatment, okay to work with PT and OT, no urgent surgical intervention.  Patient needs workup for malignancy. Palliative care consulted, started fentanyl patch.   RUL Lung Mass Pt was following at [facility] and he was diagnosed with lung mass in October 2024 but he  did not pursue for any workup and continued smoking. CT Chest: Large lobular mass in the right upper lobe measuring 6 x 4.3 x 4.8 cm inseparable from the adjacent mediastinum and suprahilar region with postobstructive pneumonia pneumonitis and bronchiectasis.  Findings are highly suspicious for a bronchogenic carcinoma. *Mediastinal adenopathy. *Small right pleural effusion. *8 mm nodule in the left lower lobe anterior segment. Discussed with pulmonologist, patient needs PET scan as an outpatient and follow-up with oncologist.    Left Gluteal Mass, left iliac bone and Right femur lesion, most likely metastatic ds MRI L-spine: mass which appears to be projecting within the posteromedial aspect of the left gluteus muscle against the adjacent iliac posterosuperior spine  CT A/p reviewed, multiple metastatic lesions and right inguinal hernia without obstruction. Dr. Orlie Dakin, recommended PET scan and biopsy as an outpatient.  Patient is not sure about moving forward for treatment plan yet, but he agreed for PET scan and biopsy.  Acute kidney injury: Kidney function improved. Avoid nephrotoxic drugs.   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 Continue gabapentin.  Dyslipidemia Continue statin therapy.   Essential hypertension Continue antihypertensive therapy.   Hematuria, unknown cause CT A/p shows bilateral renal cyst and chronic atrophy of left kidney  UA shows hematuria. urine culture NGTD Continue to monitor H&H.   Constipation, started laxatives 3/28, abdominal x-ray does not show any obstruction, gastric distention. He has good bowel movement, canceled CT abdomen/ pelvis.   Hyperkalemia due to AKI Lokelma x 2 doses given Monitor potassium level  AKI, Most likely due to dehydration Creatinine 1.57----0.98 improved   Goals of care discussion,  agreed for DNR/DNI Palliative care to continue goals of care  discussion, overall prognosis is poor.       Out of bed to chair. Incentive spirometry. Nursing supportive care. Fall, aspiration precautions. Diet:  Diet Orders (From admission, onward)     Start     Ordered   04/09/23 1932  Diet heart healthy/carb modified Room service appropriate? Yes; Fluid consistency: Thin  Diet effective now       Question Answer Comment  Diet-HS Snack? Nothing   Room service appropriate? Yes   Fluid consistency: Thin      04/09/23 1931           DVT prophylaxis: enoxaparin (LOVENOX) injection 40 mg Start: 04/01/23 1000  Level of care: MED-SURG   Code Status: Limited: Do not attempt resuscitation (DNR) -DNR-LIMITED -Do Not Intubate/DNI   Subjective: Patient was seen and examined during morning rounds today. Pain is under control, still has sciatic on the right side.  Mild shortness of breath, denied any other complaints, awaiting for placement.  Physical Exam: Vitals:   04/09/23 0812 04/09/23 2056 04/10/23 0500 04/10/23 0851  BP: (!) 175/58 134/76 125/74 (!) 142/56  Pulse: 68 90 85 63  Resp: 16 18 18 18   Temp: 98.4 F (36.9 C) 97.9 F (36.6 C) (!) 97.5 F (36.4 C) 97.8 F (36.6 C)  TempSrc:  Oral Oral Oral  SpO2: 96% 100% 100% 100%  Weight:      Height:        General - Elderly African American male, no acute distress. HEENT - PERRLA, EOMI, atraumatic head, non tender sinuses. Lung - Clear, diffuse rales, no rhonchi, wheezes. Heart - S1, S2 heard, no murmurs, rubs, trace pedal edema. Abdomen - Soft, non tender, bowel sounds good Neuro - Alert, awake and oriented x 3, non focal exam. Skin - Warm and dry.  Data Reviewed:      Latest Ref Rng & Units 04/09/2023    8:26 AM 04/08/2023    4:03 AM 04/06/2023    4:36 AM  CBC  WBC 4.0 - 10.5 K/uL 9.4  13.7  8.4   Hemoglobin 13.0 - 17.0 g/dL 60.4  54.0  98.1   Hematocrit 39.0 - 52.0 % 44.1  46.0  43.1   Platelets 150 - 400 K/uL 253  268  304       Latest Ref Rng & Units 04/09/2023     8:26 AM 04/08/2023    4:03 AM 04/07/2023    5:07 AM  BMP  Glucose 70 - 99 mg/dL 191  478    BUN 8 - 23 mg/dL 45  71    Creatinine 2.95 - 1.24 mg/dL 6.21  3.08  6.57   Sodium 135 - 145 mmol/L 143  141    Potassium 3.5 - 5.1 mmol/L 5.3  5.5    Chloride 98 - 111 mmol/L 104  102    CO2 22 - 32 mmol/L 31  29    Calcium 8.9 - 10.3 mg/dL 9.1  8.7     No results found.   Family Communication: Discussed with patient, he understand and agree. All questions answered.  Disposition: Status is: Inpatient Remains inpatient appropriate because: pain control, SNF placement.  Planned Discharge Destination: Skilled nursing facility     Time spent: 40 minutes  Author: Gillis Santa, MD 04/10/2023 3:57 PM Secure chat 7am to 7pm For on call review [email or web address].

## 2023-04-10 NOTE — TOC Progression Note (Signed)
 Transition of Care [facility]) - Progression Note    Patient Details  Name: Cameron Howe MRN: [id number] Date of Birth: [birth year]  Transition of [facility]) CM/SW Contact  Marlowe Sax, RN Phone Number: 04/10/2023, 4:34 PM  Clinical Narrative:     Approved. Plan Auth ID: [id number] dates: 3/31-04/12/23 next review date: 04/12/23   Expected Discharge Plan: Skilled Nursing Facility Barriers to Discharge: SNF Pending bed offer, Insurance Authorization  Expected Discharge Plan and Services   Discharge Planning Services: CM Consult   Living arrangements for the past 2 months: Single Family Home                   DME Agency: NA       HH Arranged: NA           Social Determinants of Health (SDOH) Interventions SDOH Screenings   Food Insecurity: No Food Insecurity (04/03/2023)   Received from [facility]  Housing: Low Risk  (04/03/2023)   Received from [facility]  Transportation Needs: No Transportation Needs (04/03/2023)   Received from [facility]  Utilities: Not At Risk (04/03/2023)   Received from [facility]  Financial Resource Strain: Low Risk  (04/03/2023)   Received from [facility]  Social Connections: Moderately Isolated (03/31/2023)  Tobacco Use: High Risk (03/31/2023)    Readmission Risk Interventions     No data to display

## 2023-04-10 NOTE — Progress Notes (Signed)
 Occupational Therapy Treatment Patient Details Name: Cameron Howe MRN: [id number] DOB: 05-22-1945 Today's Date: 04/10/2023   History of present illness Pt is a 78 y.o. male with medical history significant for osteoarthritis, COPD, type 2 diabetes mellitus, essential, hypertension and tobacco abuse, who presented to the emergency room with acute onset of right-sided sciatica-like pain that has been significantly worsening lately giving him trouble with ambulation. MD assessment includes: acute on chronic low back pain, R-sided sciatica, RUL lung mass, left gluteal mass, left iliac bone and right femur lesion, most likely metastatic disease, and hematuria.   OT comments  Pt seen for OT tx. Pt declined EOB/OOB ADL 2/2 recent pain easing off after medication. Pt agreeable to bed level session. Pt set up for grooming tasks, requiring assist to clean up water spilled while rinsing. Pt instructed in bed level BUE therex with yellow theraband: shoulder flexion L x10, bilat elbow flex x10 each side, and L shoulder ext rotation x10. Pt limited with R shoulder so modifications were made. Pt tolerated well. Pt continues to benefit and will continue to work towards goals.       If plan is discharge home, recommend the following:  Two people to help with walking and/or transfers;Two people to help with bathing/dressing/bathroom   Equipment Recommendations  Hospital bed;Hoyer lift    Recommendations for Other Services      Precautions / Restrictions Precautions Precautions: Fall Recall of Precautions/Restrictions: Intact Restrictions Weight Bearing Restrictions Per Provider Order: No       Mobility Bed Mobility               General bed mobility comments: declined 2/2 pain just beginning to ease up after recent pain medication    Transfers                   General transfer comment: declined 2/2 pain just beginning to ease up after recent pain medication     Balance                                            ADL either performed or assessed with clinical judgement   ADL Overall ADL's : Needs assistance/impaired     Grooming: Oral care;Bed level;Set up                                      Extremity/Trunk Assessment              Vision       Perception     Praxis     Communication Communication Communication: No apparent difficulties   Cognition Arousal: Alert Behavior During Therapy: WFL for tasks assessed/performed Cognition: No apparent impairments                               Following commands: Intact        Cueing   Cueing Techniques: Verbal cues  Exercises Other Exercises Other Exercises: Pt instructed in bed level BUE therex with yellow theraband: shoulder flexion L x10, bilat elbow flex x10 each side, and L shoulder ext rotation x10. Pt limited with R shoulder so modifications were made    Shoulder Instructions       General Comments  Pertinent Vitals/ Pain       Pain Assessment Pain Assessment: 0-10 Pain Score: 4  Pain Location: LBP Pain Descriptors / Indicators: Aching Pain Intervention(s): Premedicated before session, Monitored during session, Repositioned  Home Living                                          Prior Functioning/Environment              Frequency  Min 1X/week        Progress Toward Goals  OT Goals(current goals can now be found in the care plan section)  Progress towards OT goals: Progressing toward goals  Acute Rehab OT Goals Patient Stated Goal: to improve pain OT Goal Formulation: With patient/family Time For Goal Achievement: 04/18/23 Potential to Achieve Goals: Fair  Plan      Co-evaluation                 AM-PAC OT "6 Clicks" Daily Activity     Outcome Measure   Help from another person eating meals?: A Little Help from another person taking care of personal grooming?: A Little Help from  another person toileting, which includes using toliet, bedpan, or urinal?: Total Help from another person bathing (including washing, rinsing, drying)?: Total Help from another person to put on and taking off regular upper body clothing?: A Lot Help from another person to put on and taking off regular lower body clothing?: Total 6 Click Score: 11    End of Session Equipment Utilized During Treatment: Oxygen  OT Visit Diagnosis: Other abnormalities of gait and mobility (R26.89);Muscle weakness (generalized) (M62.81)   Activity Tolerance Patient limited by pain   Patient Left in bed;with call bell/phone within reach;with bed alarm set   Nurse Communication          Time: 8119-1478 OT Time Calculation (min): 19 min  Charges: OT General Charges $OT Visit: 1 Visit OT Treatments $Therapeutic Exercise: 8-22 mins  Arman Filter., MPH, MS, OTR/L ascom [phone] 04/10/23, 4:04 PM

## 2023-04-10 NOTE — TOC Progression Note (Signed)
 Transition of Care [facility]) - Progression Note    Patient Details  Name: Cameron Howe MRN: [id number] Date of Birth: Oct 27, 1945  [facility]) CM/SW Contact  Marlowe Sax, RN Phone Number: 04/10/2023, 1:19 PM  Clinical Narrative:     Ins pending Plan Auth ID:  [id number]  Expected Discharge Plan: Skilled Nursing Facility Barriers to Discharge: SNF Pending bed offer, Insurance Authorization  Expected Discharge Plan and Services   Discharge Planning Services: CM Consult   Living arrangements for the past 2 months: Single Family Home                   DME Agency: NA       HH Arranged: NA           Social Determinants of Health (SDOH) Interventions SDOH Screenings   Food Insecurity: No Food Insecurity (04/03/2023)   Received from [facility]  Housing: Low Risk  (04/03/2023)   Received from [facility]  Transportation Needs: No Transportation Needs (04/03/2023)   Received from [facility]  Utilities: Not At Risk (04/03/2023)   Received from [facility]  Financial Resource Strain: Low Risk  (04/03/2023)   Received from [facility]  Social Connections: Moderately Isolated (03/31/2023)  Tobacco Use: High Risk (03/31/2023)    Readmission Risk Interventions     No data to display

## 2023-04-11 DIAGNOSIS — M545 Low back pain, unspecified: Secondary | ICD-10-CM | POA: Diagnosis not present

## 2023-04-11 DIAGNOSIS — G8929 Other chronic pain: Secondary | ICD-10-CM | POA: Diagnosis not present

## 2023-04-11 LAB — BASIC METABOLIC PANEL WITH GFR
Anion gap: 7 (ref 5–15)
BUN: 28 mg/dL — ABNORMAL HIGH (ref 8–23)
CO2: 31 mmol/L (ref 22–32)
Calcium: 8.7 mg/dL — ABNORMAL LOW (ref 8.9–10.3)
Chloride: 103 mmol/L (ref 98–111)
Creatinine, Ser: 0.98 mg/dL (ref 0.61–1.24)
GFR, Estimated: >60 mL/min (ref >60)
Glucose, Bld: 110 mg/dL — ABNORMAL HIGH (ref 70–99)
Potassium: 4.1 mmol/L (ref 3.5–5.1)
Sodium: 141 mmol/L (ref 135–145)

## 2023-04-11 MED ORDER — ALBUTEROL SULFATE HFA 108 (90 BASE) MCG/ACT IN AERS: 2.0000 | INHALATION_SPRAY | Freq: Four times a day (QID) | RESPIRATORY_TRACT | Status: DC | PRN

## 2023-04-11 MED ORDER — HYDRALAZINE HCL 50 MG PO TABS: 50.0000 mg | ORAL_TABLET | Freq: Four times a day (QID) | ORAL | Status: DC | PRN

## 2023-04-11 MED ORDER — UMECLIDINIUM-VILANTEROL 62.5-25 MCG/ACT IN AEPB: 1.0000 | INHALATION_SPRAY | Freq: Every day | RESPIRATORY_TRACT | Status: DC

## 2023-04-11 MED ORDER — TRAZODONE HCL 50 MG PO TABS: 25.0000 mg | ORAL_TABLET | Freq: Every evening | ORAL | Status: DC | PRN

## 2023-04-11 MED ORDER — FENTANYL 12 MCG/HR TD PT72
1.0000 | MEDICATED_PATCH | TRANSDERMAL | 0 refills | Status: DC
Start: 2023-04-11 — End: 2023-05-09

## 2023-04-11 MED ORDER — METOPROLOL TARTRATE 25 MG PO TABS: 25.0000 mg | ORAL_TABLET | Freq: Two times a day (BID) | ORAL | Status: DC

## 2023-04-11 MED ORDER — POLYETHYLENE GLYCOL 3350 17 G PO PACK: 17.0000 g | PACK | Freq: Every day | ORAL | Status: DC

## 2023-04-11 MED ORDER — ENSURE ENLIVE PO LIQD
237.0000 mL | Freq: Two times a day (BID) | ORAL | Status: DC
Start: 2023-04-11 — End: 2023-04-11
  Administered 2023-04-11 (×2): 237 mL via ORAL

## 2023-04-11 MED ORDER — BISACODYL 5 MG PO TBEC: 10.0000 mg | DELAYED_RELEASE_TABLET | Freq: Every day | ORAL | Status: DC | PRN

## 2023-04-11 MED ORDER — PANTOPRAZOLE SODIUM 40 MG PO TBEC: 40.0000 mg | DELAYED_RELEASE_TABLET | Freq: Every day | ORAL | Status: DC

## 2023-04-11 MED ORDER — OXYCODONE HCL 5 MG PO TABS: 5.0000 mg | ORAL_TABLET | Freq: Four times a day (QID) | ORAL | 0 refills | Status: DC | PRN

## 2023-04-11 MED ORDER — BISACODYL 10 MG RE SUPP: 10.0000 mg | Freq: Every day | RECTAL | Status: DC | PRN

## 2023-04-11 NOTE — Plan of Care (Signed)

## 2023-04-11 NOTE — TOC Progression Note (Signed)
 Transition of Care [facility]) - Progression Note    Patient Details  Name: Cameron Howe MRN: [id number] Date of Birth: [birth year]  [facility]) CM/SW Contact  Marlowe Sax, RN Phone Number: 04/11/2023, 1:36 PM  Clinical Narrative:     Patient going to room 708 at [facility] called to transport  Expected Discharge Plan: Skilled Nursing Facility Barriers to Discharge: SNF Pending bed offer, Insurance Authorization  Expected Discharge Plan and Services   Discharge Planning Services: CM Consult   Living arrangements for the past 2 months: Single Family Home Expected Discharge Date: 04/11/23                 DME Agency: NA       HH Arranged: NA           Social Determinants of Health (SDOH) Interventions SDOH Screenings   Food Insecurity: No Food Insecurity (04/03/2023)   Received from [facility]  Housing: Low Risk  (04/03/2023)   Received from [facility]  Transportation Needs: No Transportation Needs (04/03/2023)   Received from [facility]  Utilities: Not At Risk (04/03/2023)   Received from [facility]  Financial Resource Strain: Low Risk  (04/03/2023)   Received from [facility]  Social Connections: Moderately Isolated (03/31/2023)  Tobacco Use: High Risk (03/31/2023)    Readmission Risk Interventions     No data to display

## 2023-04-11 NOTE — Discharge Summary (Signed)
 [facility] Discharge Summary   Patient: Cameron Howe ZOX:[id number]  PCP: Barbette Reichmann, MD  Date of admission: 03/31/2023   Date of discharge:  04/11/2023     Discharge Diagnoses:  Principal Problem:   Acute on chronic low back pain Active Problems:   Type 2 diabetes mellitus with peripheral neuropathy (HCC)   Essential hypertension   Dyslipidemia   Peripheral neuropathy   Chronic low back pain with sciatica   Mass of right lung   Mediastinal adenopathy   Palliative care encounter   Admitted From: Home Disposition:  SNF   Recommendations for Outpatient Follow-up:  F/U  PCP, need to be seen by an MD in 1-2 days F/u with Oncologist and get PET Scan and Biopsy Follow up LABS/TEST: PET scan and biopsy   Contact information for after-discharge care     Destination     [facility] Preferred SNF .   Service: Skilled Nursing Contact information: [address] [place] [address] [phone]                    Diet recommendation: Regular diet  Activity: The patient is advised to gradually reintroduce usual activities, as tolerated  Discharge Condition: stable  Code Status: DNR -Limited  History of present illness: As per the H and P dictated on admission. Hospital Course:  TEAGEN MCLEARY is a 78 y.o. male with medical history significant for osteoarthritis, COPD, type 2 diabetes mellitus, essential, hypertension and tobacco abuse, who presented to the emergency room with acute onset of right-sided sciatica-like pain that has been significantly worsening lately giving him trouble with ambulation.  He was able to get from bed to wheelchair and currently is not able to ambulate or sit in his wheelchair.  He is not able to perform his ADLs secondarily.  His pain has been going on over the last 6 years.    MRI of the lumbar spine with and without contrast revealed the following: Multilevel degenerative disc disease with  spondylitic disc herniations and central canal stenosis as described above. *3.8 x 2.7 cm in maximum diameter bone lesion, hyperintense T2 weighted images mass which appears to be projecting within the posteromedial aspect of the left gluteus muscle against the adjacent iliac posterosuperior spine there is no enhancement after the intravenous administration of contrast material. This is a new lesion since prior examination.   Patient is admitted to hospitalist service for further management evaluation of acute on chronic low back pain, right upper lobe lung mass, left gluteal, iliac bone, right femur lesion.  Oncology, palliative services consulted.   Assessment and Plan:  # Acute on chronic low back pain # Right-sided sciatica. S/p 1 dose of Decadron for now. Continue pain management prn Neurosurgery consulted, recommended symptomatic treatment, okay to work with PT and OT, no urgent surgical intervention.  Patient needs workup for malignancy. Palliative care consulted, started fentanyl patch.   # RUL Lung Mass Pt was following at [facility] and he was diagnosed with lung mass in October 2024 but he did not pursue for any workup and continued smoking. CT Chest: Large lobular mass in the right upper lobe measuring 6 x 4.3 x 4.8 cm inseparable from the adjacent mediastinum and suprahilar region with postobstructive pneumonia pneumonitis and bronchiectasis.  Findings are highly suspicious for a bronchogenic carcinoma. *Mediastinal adenopathy. *Small right pleural effusion. *8 mm nodule in the left lower lobe anterior segment. Discussed with pulmonologist, patient needs PET scan as an  outpatient and follow-up with oncologist.    # Left Gluteal Mass, left iliac bone and Right femur lesion, most likely metastatic ds MRI L-spine: mass which appears to be projecting within the posteromedial aspect of the left gluteus muscle against the adjacent iliac posterosuperior spine  CT A/p reviewed, multiple  metastatic lesions and right inguinal hernia without obstruction. Dr. Orlie Dakin, recommended PET scan and biopsy as an outpatient.  Patient is not sure about moving forward for treatment plan yet, but he agreed for PET scan and biopsy.   # Acute kidney injury: Kidney function improved. Avoid nephrotoxic drugs.   # Type 2 diabetes mellitus with peripheral neuropath: Continue insulin sliding scale as per floor protocol. Continue gabapentin. # Dyslipidemia: Discontinued statin to prevent myopathy. # Essential hypertension: Continued metoprolol 25 mg p.o. daily and hydralazine as needed Discontinued amlodipine, losartan, hydrochlorothiazide and Lasix. Monitor BP and titrate medications accordingly.   # Hematuria, unknown cause. Resolved  CT A/p shows bilateral renal cyst and chronic atrophy of left kidney  UA shows hematuria. urine culture NGTD.  H&H stable. # Constipation, resolved s/p laxatives.   3/28, abdominal x-ray does not show any obstruction, gastric distention. He has good bowel movement, canceled CT abdomen/ pelvis. # Hyperkalemia due to AKI, s/p Lokelma x 2 doses given. Resolved K wnl now Monitor potassium level   Goals of care discussion, agreed for DNR/DNI Palliative care to continue goals of care discussion, overall prognosis is poor.  Body mass index is 25.67 kg/m.  Nutrition Interventions:  Pressure Injury Buttocks Right Stage 2 -  Partial thickness loss of dermis presenting as a shallow open injury with a red, pink wound bed without slough. (Active)     Location: Buttocks  Location Orientation: Right  Staging: Stage 2 -  Partial thickness loss of dermis presenting as a shallow open injury with a red, pink wound bed without slough.  Wound Description (Comments):   Present on Admission: Yes  Dressing Type Foam - Lift dressing to assess site every shift 04/11/23 0754     Pain control  - [facility] Controlled Substance Reporting System database could not be reviewed  as website was not working. -Prescribed Duragesic patch and oxycodone as needed for pain control, prescription given as per SNF requirement. - Patient was instructed, not to drive, operate heavy machinery, perform activities at heights, swimming or participation in water activities or provide baby sitting services while on Pain, Sleep and Anxiety Medications; until his outpatient Physician has advised to do so again.  - Also recommended to not to take more than prescribed Pain, Sleep and Anxiety Medications.  Patient was seen by physical therapy, who recommended Therapy, SNF placement, which was arranged. On the day of the discharge the patient's vitals were stable, and no other acute medical condition were reported by patient. the patient was felt safe to be discharge at SNF with Therapy.  Consultants: Neurosurgery, oncologist, pulmonary, palliative care Procedures: None  Discharge Exam: General: Appear in no distress, no Rash; Oral Mucosa Clear, moist. Cardiovascular: S1 and S2 Present, no Murmur, Respiratory: normal respiratory effort, Bilateral Air entry present and no Crackles, no wheezes Abdomen: Bowel Sound present, Soft and no tenderness, no hernia Extremities: no Pedal edema, no calf tenderness Neurology: alert and oriented to time, place, and person affect appropriate.  Filed Weights   04/03/23 2017  Weight: 78.8 kg   Vitals:   04/11/23 0448 04/11/23 0734  BP: 139/63 (!) 121/57  Pulse: 69 76  Resp: 18 16  Temp:  97.8 F (36.6 C) 97.9 F (36.6 C)  SpO2: 100% 100%    DISCHARGE MEDICATION: Allergies as of 04/11/2023   No Known Allergies      Medication List     STOP taking these medications    amLODipine 10 MG tablet Commonly known as: NORVASC   atorvastatin 40 MG tablet Commonly known as: LIPITOR   furosemide 20 MG tablet Commonly known as: LASIX   hydrochlorothiazide 12.5 MG tablet Commonly known as: HYDRODIURIL   Ibuprofen 200 MG Caps   losartan 100  MG tablet Commonly known as: COZAAR       TAKE these medications    acetaminophen 650 MG CR tablet Commonly known as: TYLENOL Take 650 mg by mouth every 8 (eight) hours as needed for pain.   albuterol 108 (90 Base) MCG/ACT inhaler Commonly known as: VENTOLIN HFA Inhale 2 puffs into the lungs every 6 (six) hours as needed for wheezing or shortness of breath.   aspirin 81 MG tablet Take 81 mg by mouth daily. 800AM   bisacodyl 5 MG EC tablet Commonly known as: DULCOLAX Take 2 tablets (10 mg total) by mouth daily as needed for moderate constipation.   bisacodyl 10 MG suppository Commonly known as: DULCOLAX Place 1 suppository (10 mg total) rectally daily as needed for severe constipation.   fentaNYL 12 MCG/HR Commonly known as: DURAGESIC Place 1 patch onto the skin every 3 (three) days.   hydrALAZINE 50 MG tablet Commonly known as: APRESOLINE Take 1 tablet (50 mg total) by mouth every 6 (six) hours as needed (SBP >150).   lidocaine 5 % Commonly known as: LIDODERM Place 1 patch onto the skin daily. Remove & Discard patch within 12 hours or as directed by MD   metoprolol tartrate 25 MG tablet Commonly known as: LOPRESSOR Take 1 tablet (25 mg total) by mouth 2 (two) times daily. What changed: when to take this   oxyCODONE 5 MG immediate release tablet Commonly known as: Oxy IR/ROXICODONE Take 1 tablet (5 mg total) by mouth every 6 (six) hours as needed for moderate pain (pain score 4-6) or severe pain (pain score 7-10).   pantoprazole 40 MG tablet Commonly known as: PROTONIX Take 1 tablet (40 mg total) by mouth daily. Start taking on: April 12, 2023   polyethylene glycol 17 g packet Commonly known as: MIRALAX / GLYCOLAX Take 17 g by mouth daily. Skip the dose if no constipation   traZODone 50 MG tablet Commonly known as: DESYREL Take 0.5 tablets (25 mg total) by mouth at bedtime as needed for sleep.   umeclidinium-vilanterol 62.5-25 MCG/ACT Aepb Commonly known  as: ANORO ELLIPTA Inhale 1 puff into the lungs daily.               Discharge Care Instructions  (From admission, onward)           Start     Ordered   04/11/23 0000  Discharge wound care:       Comments: As above   04/11/23 1025           No Known Allergies Discharge Instructions     Call MD for:  difficulty breathing, headache or visual disturbances   Complete by: As directed    Call MD for:  extreme fatigue   Complete by: As directed    Call MD for:  persistant dizziness or light-headedness   Complete by: As directed    Call MD for:  persistant nausea and vomiting   Complete by: As  directed    Call MD for:  severe uncontrolled pain   Complete by: As directed    Call MD for:  temperature >100.4   Complete by: As directed    Diet - low sodium heart healthy   Complete by: As directed    Discharge instructions   Complete by: As directed    F/U  PCP, need to be seen by an MD in 1-2 days F/u with Oncologist and get PET Scan and Biopsy   Discharge wound care:   Complete by: As directed    As above   Increase activity slowly   Complete by: As directed        The results of significant diagnostics from this hospitalization (including imaging, microbiology, ancillary and laboratory) are listed below for reference.    Significant Diagnostic Studies: DG Abd 1 View Result Date: 04/07/2023 CLINICAL DATA:  Vomiting, small-bowel obstruction EXAM: ABDOMEN - 1 VIEW COMPARISON:  CT 04/01/2023 FINDINGS: The stomach remains mildly dilated. Multiple gas-filled mid abdominal bowel loops without overt dilatation. Right perihilar and basilar pulmonary opacities as before. Multilevel thoracolumbar spondylitic change. Aortoiliac calcified plaque. IMPRESSION: Persistent  gastric dilatation. Electronically Signed   By: Corlis Leak M.D.   On: 04/07/2023 10:38   CT ABDOMEN PELVIS W CONTRAST Result Date: 04/01/2023 CLINICAL DATA:  Right lung mass, metastatic disease/staging workup  * Tracking Code: BO * EXAM: CT ABDOMEN AND PELVIS WITH CONTRAST TECHNIQUE: Multidetector CT imaging of the abdomen and pelvis was performed using the standard protocol following bolus administration of intravenous contrast. RADIATION DOSE REDUCTION: This exam was performed according to the departmental dose-optimization program which includes automated exposure control, adjustment of the mA and/or kV according to patient size and/or use of iterative reconstruction technique. CONTRAST:  75mL OMNIPAQUE IOHEXOL 300 MG/ML  SOLN COMPARISON:  CT chest 03/21/2023 and CT abdomen 09/25/2018 FINDINGS: Lower chest: Lobular right pleural thickening at the lung base compatible with exudative or malignant pleural effusion. Enlarged lymph nodes just outside of the pericardium in the chest partially confluent with the pleural rind. Subcarinal node 2.0 cm in short axis on image 5 series 2. Edema tracks in the soft tissues of the right chest and right breast. Elevated right hemidiaphragm. Hepatobiliary: Contracted gallbladder. Nonspecific 8 mm hypodense lesion in the lateral segment left hepatic lobe on image 41 series 5, not present on 09/25/2018. Likely benign but technically nonspecific. Pancreas: Unremarkable Spleen: Unremarkable Adrenals/Urinary Tract: Mild nodularity of the lateral limb right adrenal gland, 1.0 cm in diameter on image 33 series 2, not present on 09/25/2018, early metastatic lesion not excluded. Bilateral renal cysts. No further imaging workup of these lesions is indicated. Chronic atrophy of the left kidney lower pole anteriorly. Stomach/Bowel: The stomach is distended. No obvious proximal obstruction, the pyloric channel is patent. Direct right inguinal hernia contains the cecum and terminal ileum. No findings of obstruction or strangulation at this time. Anastomotic staple line the distal sigmoid colon region. Vascular/Lymphatic: Atherosclerosis is present, including aortoiliac atherosclerotic disease. No  pathologic adenopathy. Reproductive: Prostatomegaly. Other: Low-grade edema in the mesentery and omentum with trace edema along the right paracolic gutter. Nonspecific presacral edema. Musculoskeletal: Within the upper margin of the left gluteus medius, a 6.0 by 4.2 by 5.8 cm (volume = 77 cm^3) lesion with enhancing margins is present also abutting the posterior margin of the left iliac bone. There is demineralization of the adjacent iliac bone with some associated cortical irregularity and underlying lucency. Possibilities include malignancy with bony and muscular involvement, versus  a gluteal abscess with early adjacent osteomyelitis. There is also some abnormal thickening of the adjacent upper iliacus muscle raising the possibility of some extension through the iliac bone to involve the iliacus muscle. Lumbar spondylosis and degenerative disc disease observed causing multilevel impingement. Lytic lesion of the right anterior inter trochanteric portion of the hip, image 96 series 2, about 1.4 cm in diameter, likely metastatic. This was also observed on the dedicated CT of the right hip from 03/16/2023. IMPRESSION: 1. Lobular right pleural thickening at the lung base compatible with exudative or malignant pleural effusion. Enlarged lymph nodes just outside of the pericardium in the chest partially confluent with the pleural rind. 2. Mild nodularity of the lateral limb right adrenal gland, 1.0 cm in diameter, not present on 09/25/2018, early metastatic lesion not excluded. 3. Nonspecific 8 mm hypodense lesion in the lateral segment left hepatic lobe, not present on 09/25/2018. Likely benign but technically nonspecific. 4. Lytic lesion of the right anterior intertrochanteric portion of the hip, about 1.4 cm in diameter, likely metastatic. This was also observed on the dedicated CT of the right hip from 03/16/2023. 5. Within the upper margin of the left gluteus medius, a 6.0 by 4.2 by 5.8 cm (volume = 77 cc) lesion  with enhancing margins is present also abutting the posterior margin of the left iliac bone. There is demineralization of the adjacent iliac bone with some associated cortical irregularity and underlying lucency. Possibilities include malignancy with bony and muscular involvement, versus a gluteal abscess with early adjacent osteomyelitis. There is also some abnormal thickening of the adjacent upper iliacus muscle raising the possibility of some extension through the iliac bone to involve the iliacus muscle. 6. Direct right inguinal hernia contains the cecum and terminal ileum. No findings of obstruction or strangulation at this time. 7. Prostatomegaly. 8. Low-grade edema in the mesentery and omentum with trace edema along the right paracolic gutter. Nonspecific presacral edema. 9. Lumbar spondylosis and degenerative disc disease causing multilevel impingement. 10.  Aortic Atherosclerosis (ICD10-I70.0). Electronically Signed   By: Gaylyn Rong M.D.   On: 04/01/2023 18:34   MR Lumbar Spine W Wo Contrast Result Date: 03/31/2023 CLINICAL DATA:  Chronic low back pain EXAM: MRI LUMBAR SPINE WITHOUT AND WITH CONTRAST TECHNIQUE: Multiplanar and multiecho pulse sequences of the lumbar spine were obtained without and with intravenous contrast. CONTRAST:  7mL GADAVIST GADOBUTROL 1 MMOL/ML IV SOLN COMPARISON:  September 16, 2022 MRI lumbar spine FINDINGS: Segmentation:  Standard. Alignment:  Physiologic. Vertebrae:  No fracture, evidence of discitis, or bone lesion. Conus medullaris and cauda equina: Conus extends to the T12 level. Conus and cauda equina appear normal. Paraspinal and other soft tissues: Negative. Disc levels: T12 - L1 No disc protrusion. No foraminal stenosis. No central canal stenosis. L1 - L2 narrowing of the disc space with a central, left paracentral spondylitic bulging disc disc herniation bone bridge complex flattening the thecal sac and encroaching particularly the left neural foramina small  right lateral bulging disc encroaching the right neural foramina L2 - L3 severe narrowing of the disc space. With a large central spondylitic disc herniation bone bridge complex extending right and left laterally into both neural foramina producing severe central canal stenosis, thecal sac compression aggravated by the hypertrophic osteoarthritic changes of the posterior elements, ligamentum flavum and hypertrophy of the facet joints. These findings are basically unchanged since prior examination. L3 - L4 severe narrowing of the disc space with a central, right and left paracentral spondylitic disc herniation bone  bridge complex flattening the thecal sac and encroaching on both neural foramina, with significant central canal stenosis aggravated by the hypertrophic osteoarthritic changes of the posterior elements similar to prior examination. L4 - L5 narrowing of the disc space with degenerative disc disease spondylitic central left paracentral disc herniation bone bridge complex flattening the thecal sac and encroaching the left neural foramina aggravated by hypertrophic osteoarthritic changes of the left facet joints and ligamentum flavum. L5 - S1 No disc protrusion. No foraminal stenosis. No central canal stenosis. 3.8 by 2.7 cm in maximum diameter bone lesion,, hyperintense T2 weighted images mass which appears to be projecting within the posteromedial aspect of the left gluteus muscle against the adjacent iliac posterosuperior spine there is no enhancement after the intravenous administration of contrast material. This is a new lesion since prior examination. And on these images there is no clear evidence of bone involvement. Significance is unclear, perhaps ultrasound-guided aspiration may provide additional diagnostic information. If lesion can not be seen on ultrasound consider CT-guided biopsy. No abnormal enhancement after the intravenous administration of contrast material IMPRESSION: *Multilevel  degenerative disc disease with spondylitic disc herniations and central canal stenosis as described above. *3.8 x 2.7 cm in maximum diameter bone lesion, hyperintense T2 weighted images mass which appears to be projecting within the posteromedial aspect of the left gluteus muscle against the adjacent iliac posterosuperior spine there is no enhancement after the intravenous administration of contrast material. This is a new lesion since prior examination. Significance is unclear, perhaps ultrasound-guided aspiration may provide additional diagnostic information. If lesion can not be seen on ultrasound consider CT-guided biopsy. Electronically Signed   By: Shaaron Adler M.D.   On: 03/31/2023 10:47   CT CHEST W CONTRAST Result Date: 03/31/2023 CLINICAL DATA:  Mass in the right lung EXAM: CT CHEST WITH CONTRAST TECHNIQUE: Multidetector CT imaging of the chest was performed during intravenous contrast administration. RADIATION DOSE REDUCTION: This exam was performed according to the departmental dose-optimization program which includes automated exposure control, adjustment of the mA and/or kV according to patient size and/or use of iterative reconstruction technique. CONTRAST:  75mL OMNIPAQUE IOHEXOL 300 MG/ML  SOLN COMPARISON:  CT chest September 25, 2018 FINDINGS: Cardiovascular: No significant vascular findings. Normal heart size. No pericardial effusion. Extensive coronary artery calcifications. No pericardial effusions. Mediastinum/Nodes: Retrocaval pretracheal adenopathy measuring 1.9 x 1.6 cm. Pretracheal adenopathy 1.5 cm. Large lobular mass in the right upper lobe measuring 6 by 4.3 by 4.8 cm inseparable from the adjacent mediastinum and suprahilar region with postobstructive pneumonia pneumonitis and bronchiectasis. Thickening of the adjacent fissure with nodularity. Findings are highly suspicious for a bronchogenic carcinoma. Lungs/Pleura: Small right pleural effusion. Postobstructive pneumonia pneumonitis  of the right upper lobe as described above. 8 mm nodule in the left lower lobe anterior segment image 83. Upper Abdomen: No acute abnormality. Musculoskeletal: No chest wall abnormality. No acute or significant osseous findings. IMPRESSION: *Large lobular mass in the right upper lobe measuring 6 x 4.3 x 4.8 cm inseparable from the adjacent mediastinum and suprahilar region with postobstructive pneumonia pneumonitis and bronchiectasis. Findings are highly suspicious for a bronchogenic carcinoma. *Mediastinal adenopathy. *Small right pleural effusion. *8 mm nodule in the left lower lobe anterior segment. Electronically Signed   By: Shaaron Adler M.D.   On: 03/31/2023 10:37   CT Hip Right Wo Contrast Result Date: 03/16/2023 CLINICAL DATA:  Patient fell. Thigh pain. Clinical concern for hip fracture. EXAM: CT OF THE RIGHT HIP WITHOUT CONTRAST TECHNIQUE: Multidetector CT imaging of  the right hip was performed according to the standard protocol. Multiplanar CT image reconstructions were also generated. RADIATION DOSE REDUCTION: This exam was performed according to the departmental dose-optimization program which includes automated exposure control, adjustment of the mA and/or kV according to patient size and/or use of iterative reconstruction technique. COMPARISON:  Abdomen and pelvis CT 09/25/2018 FINDINGS: Bones/Joint/Cartilage No right femoral neck fracture. Right superior and inferior pubic rami are intact. Small joint effusion evident. 1.1 x 2.8 x 1.0 cm lucent lesion identified medial cortex of the femoral metaphysis just anterior to the lesser trochanter. This is new in the interval since the 2022 exam. Ligaments Suboptimally assessed by CT. Muscles and Tendons No evidence for intramuscular or subcutaneous hematoma in the right hip region. No substantial subcutaneous edema. Soft tissues Right groin hernia has been incompletely visualized but contains a segment of colon. No overt complicating features within the  visualized portion of the hernia. Prostate gland appears enlarged. IMPRESSION: 1. No acute bony findings in the right hip. No evidence for intramuscular or subcutaneous hematoma. 2. 1.1 x 2.8 x 1.0 cm lucent lesion medial cortex of the femoral metaphysis just anterior to the lesser trochanter. This is new in the interval since the 2022 exam. This is nonspecific and while not overtly concerning, lesion warrants additional imaging follow-up. Nonemergent outpatient MRI of the right hip with and without contrast recommended to further evaluate. 3. Right groin hernia has been incompletely visualized but contains a segment of colon. No overt complicating features within the visualized portion of the hernia. Electronically Signed   By: Kennith Center M.D.   On: 03/16/2023 05:39   DG Femur Min 2 Views Right Result Date: 03/16/2023 CLINICAL DATA:  Status post fall. EXAM: RIGHT FEMUR 2 VIEWS COMPARISON:  None Available. FINDINGS: There is no evidence of fracture or other focal bone lesions. Mild to moderate severity vascular calcification is seen. Soft tissue structures are otherwise unremarkable. IMPRESSION: No acute osseous abnormality. Electronically Signed   By: Aram Candela M.D.   On: 03/16/2023 00:47   DG Ankle Complete Right Result Date: 03/16/2023 CLINICAL DATA:  Status post fall. EXAM: RIGHT ANKLE - COMPLETE 3+ VIEW COMPARISON:  None Available. FINDINGS: There is a small, nondisplaced acute fracture involving the distal tip of the right lateral malleolus. There is no evidence of dislocation. There is no evidence of arthropathy or other focal bone abnormality. Mild diffuse soft tissue swelling is present. IMPRESSION: Small, nondisplaced acute fracture involving the distal tip of the right lateral malleolus. Electronically Signed   By: Aram Candela M.D.   On: 03/16/2023 00:46    Microbiology: Recent Results (from the past 240 hours)  Urine Culture     Status: None   Collection Time: 04/02/23  3:25 PM    Specimen: Urine, Random  Result Value Ref Range Status   Specimen Description   Final    URINE, RANDOM Performed at [facility], [address], [place], [place] [address]    Special Requests   Final    NONE Reflexed from [phone] Performed at [facility], [address] [place], [place] [address]    Culture   Final    NO GROWTH Performed at [facility], [address], [place], [place] [address]    Report Status 04/03/2023 FINAL  Final     Labs: CBC: Recent Labs  Lab 04/05/23 0436 04/06/23 0436 04/08/23 0403 04/09/23 0826  WBC 7.4 8.4 13.7* 9.4  HGB 13.3 13.6 14.0 13.6  HCT 43.0 43.1 46.0 44.1  MCV 85.0 83.4 86.1 85.8  PLT 288 304 268 253   Basic Metabolic Panel: Recent Labs  Lab 04/05/23 0436 04/06/23 0436 04/07/23 0507 04/08/23 0403 04/09/23 0826 04/11/23 0914  NA 137 138  --  141 143 141  K 4.3 4.3  --  5.5* 5.3* 4.1  CL 106 104  --  102 104 103  CO2 23 27  --  29 31 31   GLUCOSE 113* 107*  --  138* 113* 110*  BUN 22 25*  --  71* 45* 28*  CREATININE 0.70 0.74 1.19 1.57* 0.98 0.98  CALCIUM 8.7* 8.6*  --  8.7* 9.1 8.7*  MG  --   --   --  3.2*  --   --   PHOS  --   --   --  4.8*  --   --    Liver Function Tests: No results for input(s): "AST", "ALT", "ALKPHOS", "BILITOT", "PROT", "ALBUMIN" in the last 168 hours. No results for input(s): "LIPASE", "AMYLASE" in the last 168 hours. No results for input(s): "AMMONIA" in the last 168 hours. Cardiac Enzymes: No results for input(s): "CKTOTAL", "CKMB", "CKMBINDEX", "TROPONINI" in the last 168 hours. BNP (last 3 results) No results for input(s): "BNP" in the last 8760 hours. CBG: No results for input(s): "GLUCAP" in the last 168 hours.  Time spent: 35 minutes  Signed:  Gillis Santa  [facility] 04/11/2023 1:21 PM

## 2023-04-12 ENCOUNTER — Encounter: Payer: Self-pay | Admitting: *Deleted

## 2023-04-12 DIAGNOSIS — R918 Other nonspecific abnormal finding of lung field: Secondary | ICD-10-CM

## 2023-04-12 NOTE — Progress Notes (Signed)
 Pt recently discharged from the hospital to [facility]. Per Dr. Orlie Dakin, pt will need further workup with PET scan and hospital follow up. Orders placed. Pt will be called with appts once scheduled. Nothing further needed at this time.

## 2023-04-19 ENCOUNTER — Ambulatory Visit
Admission: RE | Admit: 2023-04-19 | Discharge: 2023-04-19 | Disposition: A | Discharge status: Home / Self Care | Source: Ambulatory Visit | Attending: Oncology | Admitting: Oncology

## 2023-04-19 DIAGNOSIS — C189 Malignant neoplasm of colon, unspecified: Secondary | ICD-10-CM | POA: Diagnosis not present

## 2023-04-19 DIAGNOSIS — K4090 Unilateral inguinal hernia, without obstruction or gangrene, not specified as recurrent: Secondary | ICD-10-CM | POA: Diagnosis not present

## 2023-04-19 DIAGNOSIS — J9819 Other pulmonary collapse: Secondary | ICD-10-CM | POA: Insufficient documentation

## 2023-04-19 DIAGNOSIS — Q619 Cystic kidney disease, unspecified: Secondary | ICD-10-CM | POA: Diagnosis not present

## 2023-04-19 DIAGNOSIS — R918 Other nonspecific abnormal finding of lung field: Secondary | ICD-10-CM | POA: Diagnosis present

## 2023-04-19 DIAGNOSIS — E279 Disorder of adrenal gland, unspecified: Secondary | ICD-10-CM | POA: Insufficient documentation

## 2023-04-19 DIAGNOSIS — K7689 Other specified diseases of liver: Secondary | ICD-10-CM | POA: Insufficient documentation

## 2023-04-19 DIAGNOSIS — J439 Emphysema, unspecified: Secondary | ICD-10-CM | POA: Insufficient documentation

## 2023-04-19 DIAGNOSIS — I700 Atherosclerosis of aorta: Secondary | ICD-10-CM | POA: Insufficient documentation

## 2023-04-19 DIAGNOSIS — I6523 Occlusion and stenosis of bilateral carotid arteries: Secondary | ICD-10-CM | POA: Diagnosis not present

## 2023-04-19 LAB — GLUCOSE, CAPILLARY: Glucose-Capillary: 119 mg/dL — ABNORMAL HIGH (ref 70–99)

## 2023-04-19 MED ORDER — FLUDEOXYGLUCOSE F - 18 (FDG) INJECTION
8.6000 | Freq: Once | INTRAVENOUS | Status: AC | PRN
  Administered 2023-04-19: 9.28 via INTRAVENOUS

## 2023-04-26 ENCOUNTER — Telehealth: Payer: Self-pay | Admitting: *Deleted

## 2023-04-26 ENCOUNTER — Inpatient Hospital Stay: Attending: Oncology | Admitting: Oncology

## 2023-04-26 VITALS — BP 123/66 | HR 66 | Temp 98.7°F | Resp 16 | Ht 72.0 in | Wt 165.0 lb

## 2023-04-26 DIAGNOSIS — R918 Other nonspecific abnormal finding of lung field: Secondary | ICD-10-CM

## 2023-04-26 DIAGNOSIS — Z85038 Personal history of other malignant neoplasm of large intestine: Secondary | ICD-10-CM | POA: Diagnosis not present

## 2023-04-26 DIAGNOSIS — C7971 Secondary malignant neoplasm of right adrenal gland: Secondary | ICD-10-CM | POA: Diagnosis present

## 2023-04-26 DIAGNOSIS — C7951 Secondary malignant neoplasm of bone: Secondary | ICD-10-CM | POA: Diagnosis present

## 2023-04-26 NOTE — Progress Notes (Signed)
 Patient returns for hospital follow up with last office visit 2022.  Patient denies new or acute problems/concerns today.

## 2023-04-26 NOTE — Telephone Encounter (Signed)
 The family is wanting to do as much PT so that he can get better. [facility] want to talk with the team for [facility] and talk about the limitations of PT with this patient. Would like to speak to Camilo Cella or [place] to talk about the patient phone # [phone]

## 2023-04-26 NOTE — Progress Notes (Signed)
 [facility]  [phone] [phone]  ID: Cameron Howe OB: [birth year]  MR#: [id number]  AOZ#:[id number]  Patient Care Team: Antonio Baumgarten, MD as PCP - General (Internal Medicine) Rochell Chroman, RN as Oncology Nurse Navigator Drake Gens, RN as Oncology Nurse Navigator Adrian Alba, Deadra Everts, MD as Consulting Physician (Oncology)  CHIEF COMPLAINT: Lung mass with widespread metastatic disease.  INTERVAL HISTORY: Patient returns to clinic today for further evaluation, hospital follow-up, and discussion of additional diagnostic testing.  He remains in rehab, but states his strength is improving.  His appetite is also improving and reports weight gain in the interim.  He continues to have right back and leg pain.  He has no neurological plaints.  He denies any recent fevers.  He has no chest pain, shortness of breath, cough, or hemoptysis.  He denies any nausea, vomiting, constipation, or diarrhea. He has no urinary complaints.  Patient offers no further specific complaints today.  REVIEW OF SYSTEMS:   Review of Systems  Constitutional:  Positive for malaise/fatigue. Negative for fever and weight loss.  Respiratory: Negative.  Negative for cough, hemoptysis and shortness of breath.   Cardiovascular: Negative.  Negative for chest pain and leg swelling.  Gastrointestinal: Negative.  Negative for abdominal pain.  Genitourinary: Negative.  Negative for dysuria.  Musculoskeletal:  Positive for back pain and joint pain.  Skin: Negative.  Negative for rash.  Neurological:  Positive for weakness. Negative for dizziness, focal weakness and headaches.  Psychiatric/Behavioral: Negative.  The patient is not nervous/anxious.     As per HPI. Otherwise, a complete review of systems is negative.  PAST MEDICAL HISTORY: Past Medical History:  Diagnosis Date   Arthritis    SHOULDERS AND JOINTS   [facility])    Colon cancer 11/2010 with parital resection  with chemo and rad tx   COPD (chronic obstructive pulmonary disease) (HCC)    Dental bridge present    REMOVABLE   Diabetes mellitus without complication (HCC)    TYPE 2, DIET CONTROLLED   Elevated PSA    Hypertension    Neuromuscular disorder (HCC)    WEAKNESS AND NUMBNESS HANDS AND FEET, POSSIBLY FROM CHEMO   Tobacco abuse     PAST SURGICAL HISTORY: Past Surgical History:  Procedure Laterality Date   CERVICAL DISCECTOMY     CERVICAL FUSION     COLON SURGERY  NOV 2012   COLON REMOVED   COLON SURGERY  APRIL 2014   OSTOMY REMOVED   COLONOSCOPY N/A 04/21/2015   Procedure: COLONOSCOPY;  Surgeon: Stephens Eis, MD;  Location[facility];  Service: Gastroenterology;  Laterality: N/A;  DIABETIC, DIET CONTROLLED   COLONOSCOPY WITH PROPOFOL N/A 11/07/2018   Procedure: COLONOSCOPY WITH PROPOFOL;  Surgeon: Marshall Skeeter, MD;  Location: [facility];  Service: Endoscopy;  Laterality: N/A;   COLOSTOMY REVISION     hx of colostomy     LAMINECTOMY     POLYPECTOMY N/A 04/21/2015   Procedure: POLYPECTOMY;  Surgeon: Stephens Eis, MD;  Location: [facility];  Service: Gastroenterology;  Laterality: N/A;   TUNNELED VENOUS PORT PLACEMENT      FAMILY HISTORY: No family history on file.  ADVANCED DIRECTIVES (Y/N):  N  HEALTH MAINTENANCE: Social History   Tobacco Use   Smoking status: Every Day    Current packs/day: 0.50    Average packs/day: 0.5 packs/day for 30.0 years (15.0 ttl pk-yrs)    Types: Cigarettes   Smokeless tobacco: Never  Substance  Use Topics   Alcohol use: No    Alcohol/week: 0.0 standard drinks of alcohol   Drug use: No     Colonoscopy:  PAP:  Bone density:  Lipid panel:  No Known Allergies  Current Outpatient Medications  Medication Sig Dispense Refill   acetaminophen (TYLENOL) 650 MG CR tablet Take 650 mg by mouth every 8 (eight) hours as needed for pain.     albuterol (VENTOLIN HFA) 108 (90 Base) MCG/ACT inhaler Inhale 2 puffs into the lungs  every 6 (six) hours as needed for wheezing or shortness of breath.     aspirin 81 MG tablet Take 81 mg by mouth daily. 800AM     atorvastatin (LIPITOR) 40 MG tablet Take 40 mg by mouth daily.     bisacodyl (DULCOLAX) 10 MG suppository Place 1 suppository (10 mg total) rectally daily as needed for severe constipation.     bisacodyl (DULCOLAX) 5 MG EC tablet Take 2 tablets (10 mg total) by mouth daily as needed for moderate constipation.     fentaNYL (DURAGESIC) 12 MCG/HR Place 1 patch onto the skin every 3 (three) days. 5 patch 0   furosemide (LASIX) 20 MG tablet Take 20 mg by mouth daily.     hydrALAZINE (APRESOLINE) 50 MG tablet Take 1 tablet (50 mg total) by mouth every 6 (six) hours as needed (SBP >150).     lidocaine (LIDODERM) 5 % Place 1 patch onto the skin daily. Remove & Discard patch within 12 hours or as directed by MD 30 patch 0   metoprolol tartrate (LOPRESSOR) 25 MG tablet Take 1 tablet (25 mg total) by mouth 2 (two) times daily.     oxyCODONE (OXY IR/ROXICODONE) 5 MG immediate release tablet Take 1 tablet (5 mg total) by mouth every 6 (six) hours as needed for moderate pain (pain score 4-6) or severe pain (pain score 7-10). 10 tablet 0   pantoprazole (PROTONIX) 40 MG tablet Take 1 tablet (40 mg total) by mouth daily.     polyethylene glycol (MIRALAX / GLYCOLAX) 17 g packet Take 17 g by mouth daily. Skip the dose if no constipation     traZODone (DESYREL) 50 MG tablet Take 0.5 tablets (25 mg total) by mouth at bedtime as needed for sleep.     umeclidinium-vilanterol (ANORO ELLIPTA) 62.5-25 MCG/ACT AEPB Inhale 1 puff into the lungs daily.     No current facility-administered medications for this visit.   Facility-Administered Medications Ordered in Other Visits  Medication Dose Route Frequency Provider Last Rate Last Admin   sodium chloride flush (NS) 0.9 % injection 10 mL  10 mL Intravenous PRN Compton Brigance J, MD   10 mL at 02/16/18 0949    OBJECTIVE: Vitals:   04/26/23  1019  BP: 123/66  Pulse: 66  Resp: 16  Temp: 98.7 F (37.1 C)  SpO2: 97%     Body mass index is 22.38 kg/m.    ECOG FS:1 - Symptomatic but completely ambulatory  General: Well-developed, well-nourished, no acute distress. Eyes: Pink conjunctiva, anicteric sclera. HEENT: Normocephalic, moist mucous membranes. Lungs: No audible wheezing or coughing. Heart: Regular rate and rhythm. Abdomen: Soft, nontender, no obvious distention. Musculoskeletal: No edema, cyanosis, or clubbing. Neuro: Alert, answering all questions appropriately. Cranial nerves grossly intact. Skin: No rashes or petechiae noted. Psych: Normal affect. Lymphatics: No cervical, calvicular, axillary or inguinal LAD.   LAB RESULTS:  Lab Results  Component Value Date   NA 141 04/11/2023   K 4.1 04/11/2023   CL  103 04/11/2023   CO2 31 04/11/2023   GLUCOSE 110 (H) 04/11/2023   BUN 28 (H) 04/11/2023   CREATININE 0.98 04/11/2023   CALCIUM 8.7 (L) 04/11/2023   PROT 7.0 03/31/2023   ALBUMIN 2.9 (L) 03/31/2023   AST 24 03/31/2023   ALT 15 03/31/2023   ALKPHOS 93 03/31/2023   BILITOT 0.7 03/31/2023   GFRNONAA >60 04/11/2023   GFRAA >60 02/05/2016    Lab Results  Component Value Date   WBC 9.4 04/09/2023   NEUTROABS 1.5 (L) 10/01/2020   HGB 13.6 04/09/2023   HCT 44.1 04/09/2023   MCV 85.8 04/09/2023   PLT 253 04/09/2023     STUDIES: NM PET Image Initial (PI) Skull Base To Thigh Result Date: 04/19/2023 CLINICAL DATA:  Initial treatment strategy for right lung mass. History of colon cancer. Additional findings concerning for metastatic disease on CT. EXAM: NUCLEAR MEDICINE PET SKULL BASE TO THIGH TECHNIQUE: 9.28 mCi F-18 FDG was injected intravenously. Full-ring PET imaging was performed from the skull base to thigh after the radiotracer. CT data was obtained and used for attenuation correction and anatomic localization. Fasting blood glucose: 119 mg/dl COMPARISON:  Chest CT 78/46/9629. Abdominopelvic CT  04/01/2023. PET-CT 01/15/2013. FINDINGS: Mediastinal blood pool activity: SUV max 2.4 NECK: No hypermetabolic cervical lymph nodes are identified. No suspicious activity identified within the pharyngeal mucosal space. Asymmetric muscular activity in the left neck, within physiologic limits. Incidental CT findings: Bilateral carotid atherosclerosis. CHEST: There are hypermetabolic mediastinal lymph nodes, including AP window lymph nodes measuring up to 8 mm on image 44/6 (SUV max 11.8) and a 1.5 cm subcarinal node on image 51/6 (SUV max 11.7). As seen on recent CTs, there is irregular pleural thickening on the right with associated hypermetabolic activity (SUV max 7.6 medially and 14.8 laterally near the costophrenic angle), consistent with pleural metastatic disease. Resulting partial right lung collapse. Ill-defined right upper lobe mass is partially obscured by adjacent parenchymal opacity, but demonstrates focal hypermetabolic activity (SUV max 19.1). No hypermetabolic pulmonary activity or highly suspicious nodularity in the left lung. 8 mm left lower lobe nodule on image 49/6 is unchanged from recent CT, without hypermetabolic activity. Incidental CT findings: Underlying mild to moderate centrilobular and paraseptal emphysema. Diffuse atherosclerosis of the aorta, great vessels and coronary arteries. ABDOMEN/PELVIS: Hypermetabolic right adrenal mass measuring 1.8 x 1.4 cm on image 79/6 (SUV max 11.0). There is no hypermetabolic activity within the liver, pancreas, spleen or left adrenal gland. There is no hypermetabolic nodal activity in the abdomen or pelvis. Bowel activity within physiologic limits. No omental or peritoneal hypermetabolic activity identified. There is prostate gland is moderately enlarged with nonspecific focal hypermetabolic activity peripherally on the left (SUV max 8.1). Incidental CT findings: Unchanged cyst posteriorly in the left hepatic lobe (image 72/6) without hypermetabolic  activity. There are cystic renal lesions bilaterally without hypermetabolic activity for which no specific follow-up imaging is recommended. Unchanged right inguinal hernia containing small bowel. There is moderate stool throughout the colon status post sigmoid anastomosis. Diffuse aortic and branch vessel atherosclerosis. SKELETON: There are multiple hypermetabolic osseous metastases. A large expansile lesion involving the left iliac wing and adjacent iliacus and gluteus musculature demonstrates peripheral hypermetabolic activity (SUV max 10.1) and central necrosis. Lytic lesion involving the anterior intertrochanteric region of the right femur demonstrates an SUV max of 13.1. There are other lesions within the right iliac bone, left sacral ala and midthoracic spine. Incidental CT findings: Multilevel spondylosis. Previous multilevel cervicothoracic fusion. Advanced glenohumeral arthropathy, right greater  than left. IMPRESSION: 1. Widespread metastatic disease as described with involvement of the right lung, right pleural space, mediastinal lymph nodes, right adrenal gland and multiple bones. 2. Hypermetabolic right upper lobe mass could reflect primary bronchogenic carcinoma or metastatic disease from the patient's colon cancer. 3. Nonspecific focal hypermetabolic activity peripherally in the left prostate gland. Correlate with PSA levels. 4. Aortic Atherosclerosis (ICD10-I70.0) and Emphysema (ICD10-J43.9). Electronically Signed   By: Elmon Hagedorn M.D.   On: 04/19/2023 11:20   DG Abd 1 View Result Date: 04/07/2023 CLINICAL DATA:  Vomiting, small-bowel obstruction EXAM: ABDOMEN - 1 VIEW COMPARISON:  CT 04/01/2023 FINDINGS: The stomach remains mildly dilated. Multiple gas-filled mid abdominal bowel loops without overt dilatation. Right perihilar and basilar pulmonary opacities as before. Multilevel thoracolumbar spondylitic change. Aortoiliac calcified plaque. IMPRESSION: Persistent  gastric dilatation.  Electronically Signed   By: Nicoletta Barrier M.D.   On: 04/07/2023 10:38   CT ABDOMEN PELVIS W CONTRAST Result Date: 04/01/2023 CLINICAL DATA:  Right lung mass, metastatic disease/staging workup * Tracking Code: BO * EXAM: CT ABDOMEN AND PELVIS WITH CONTRAST TECHNIQUE: Multidetector CT imaging of the abdomen and pelvis was performed using the standard protocol following bolus administration of intravenous contrast. RADIATION DOSE REDUCTION: This exam was performed according to the departmental dose-optimization program which includes automated exposure control, adjustment of the mA and/or kV according to patient size and/or use of iterative reconstruction technique. CONTRAST:  75mL OMNIPAQUE IOHEXOL 300 MG/ML  SOLN COMPARISON:  CT chest 03/21/2023 and CT abdomen 09/25/2018 FINDINGS: Lower chest: Lobular right pleural thickening at the lung base compatible with exudative or malignant pleural effusion. Enlarged lymph nodes just outside of the pericardium in the chest partially confluent with the pleural rind. Subcarinal node 2.0 cm in short axis on image 5 series 2. Edema tracks in the soft tissues of the right chest and right breast. Elevated right hemidiaphragm. Hepatobiliary: Contracted gallbladder. Nonspecific 8 mm hypodense lesion in the lateral segment left hepatic lobe on image 41 series 5, not present on 09/25/2018. Likely benign but technically nonspecific. Pancreas: Unremarkable Spleen: Unremarkable Adrenals/Urinary Tract: Mild nodularity of the lateral limb right adrenal gland, 1.0 cm in diameter on image 33 series 2, not present on 09/25/2018, early metastatic lesion not excluded. Bilateral renal cysts. No further imaging workup of these lesions is indicated. Chronic atrophy of the left kidney lower pole anteriorly. Stomach/Bowel: The stomach is distended. No obvious proximal obstruction, the pyloric channel is patent. Direct right inguinal hernia contains the cecum and terminal ileum. No findings of  obstruction or strangulation at this time. Anastomotic staple line the distal sigmoid colon region. Vascular/Lymphatic: Atherosclerosis is present, including aortoiliac atherosclerotic disease. No pathologic adenopathy. Reproductive: Prostatomegaly. Other: Low-grade edema in the mesentery and omentum with trace edema along the right paracolic gutter. Nonspecific presacral edema. Musculoskeletal: Within the upper margin of the left gluteus medius, a 6.0 by 4.2 by 5.8 cm (volume = 77 cm^3) lesion with enhancing margins is present also abutting the posterior margin of the left iliac bone. There is demineralization of the adjacent iliac bone with some associated cortical irregularity and underlying lucency. Possibilities include malignancy with bony and muscular involvement, versus a gluteal abscess with early adjacent osteomyelitis. There is also some abnormal thickening of the adjacent upper iliacus muscle raising the possibility of some extension through the iliac bone to involve the iliacus muscle. Lumbar spondylosis and degenerative disc disease observed causing multilevel impingement. Lytic lesion of the right anterior inter trochanteric portion of the hip, image 96  series 2, about 1.4 cm in diameter, likely metastatic. This was also observed on the dedicated CT of the right hip from 03/16/2023. IMPRESSION: 1. Lobular right pleural thickening at the lung base compatible with exudative or malignant pleural effusion. Enlarged lymph nodes just outside of the pericardium in the chest partially confluent with the pleural rind. 2. Mild nodularity of the lateral limb right adrenal gland, 1.0 cm in diameter, not present on 09/25/2018, early metastatic lesion not excluded. 3. Nonspecific 8 mm hypodense lesion in the lateral segment left hepatic lobe, not present on 09/25/2018. Likely benign but technically nonspecific. 4. Lytic lesion of the right anterior intertrochanteric portion of the hip, about 1.4 cm in diameter,  likely metastatic. This was also observed on the dedicated CT of the right hip from 03/16/2023. 5. Within the upper margin of the left gluteus medius, a 6.0 by 4.2 by 5.8 cm (volume = 77 cc) lesion with enhancing margins is present also abutting the posterior margin of the left iliac bone. There is demineralization of the adjacent iliac bone with some associated cortical irregularity and underlying lucency. Possibilities include malignancy with bony and muscular involvement, versus a gluteal abscess with early adjacent osteomyelitis. There is also some abnormal thickening of the adjacent upper iliacus muscle raising the possibility of some extension through the iliac bone to involve the iliacus muscle. 6. Direct right inguinal hernia contains the cecum and terminal ileum. No findings of obstruction or strangulation at this time. 7. Prostatomegaly. 8. Low-grade edema in the mesentery and omentum with trace edema along the right paracolic gutter. Nonspecific presacral edema. 9. Lumbar spondylosis and degenerative disc disease causing multilevel impingement. 10.  Aortic Atherosclerosis (ICD10-I70.0). Electronically Signed   By: Gaylyn Rong M.D.   On: 04/01/2023 18:34    ASSESSMENT:  Lung mass with widespread metastatic disease.  PLAN:    Lung mass with widespread metastatic disease: Unlikely recurrence of patient's colon cancer. Right upper lobe lung mass measuring 6.0 x 4.3 x 4.8 cm on CT scan from March 21, 2023.  Mass was also previously seen at [facility] in fall 2024 at which time patient apparently declined biopsy or further workup.  Recent CT scan of the abdomen and pelvis revealed a left gluteus lesion concerning for metastatic lesion or possible abscess.  PET scan results from April 19, 2023 reviewed independently and reported as above with hypermetabolic right upper lobe lung mass and widespread metastatic disease involvement of right pleural space, mediastinal lymph nodes, right adrenal  gland, and multiple bones.  Patient is unclear if he wishes to pursue treatment currently, but wishes to pursue biopsy to confirm diagnosis.  Referral has been sent to IR for biopsy of both gluteal mass as well as right supraclavicular lymph node to confirm diagnosis.  Follow-up 1 week after biopsy to discuss the results and treatment planning if desired. History of colon cancer: Patient completed treatment over 10 years ago.  Although, CEA has significantly increased to 55.4.  Biopsies as above. Right back/leg pain: Unclear if related to hypermetabolic lesion in muscle or possible secondary to his history of known sciatica.  If biopsy is positive, will recommend XRT at that time. Weakness and fatigue: Continue physical therapy and rehab as recommended. Appetite: Continues to improve.  Will consider dietary consult in the near future.   Patient expressed understanding and was in agreement with this plan. He also understands that He can call clinic at any time with any questions, concerns, or complaints.    Cancer Staging  Malignant neoplasm of sigmoid colon [facility]) Staging form: Colon and Rectum, AJCC 7th Edition - Clinical stage from 02/04/2016: Stage IIIC (T4b, N2a, M0) - Signed by Shellie Dials, MD on 02/04/2016   Shellie Dials, MD   04/26/2023 12:30 PM

## 2023-05-02 ENCOUNTER — Telehealth: Payer: Self-pay | Admitting: Oncology

## 2023-05-02 NOTE — Telephone Encounter (Signed)
 [facility] nursing director, Paola Bohr [phone] ext [phone]  Called in regards to pt ct guided biopsy to get that appt scheduled. Secure chat sent to team but they are in clinic so unable to respond right now.

## 2023-05-02 NOTE — Progress Notes (Signed)
 Cameron Cord, MD sent to Cameron Howe Approved for US  guided biopsy of left iliac met with large soft tissue component extending into the left gluteal muscle.  (Use US  to target the rim as it is centrally dead on PET).  Please tell Dr. Adrian Alba that IR cannot bx the mediastinal nodes, that would require endobronchial approach.  HKM

## 2023-05-03 ENCOUNTER — Emergency Department

## 2023-05-03 ENCOUNTER — Inpatient Hospital Stay
Admission: EM | Admit: 2023-05-03 | Discharge: 2023-05-05 | DRG: 689 | Disposition: A | Discharge status: Hospice | Source: Skilled Nursing Facility | Attending: Internal Medicine | Admitting: Internal Medicine

## 2023-05-03 DIAGNOSIS — I10 Essential (primary) hypertension: Secondary | ICD-10-CM

## 2023-05-03 DIAGNOSIS — Z6822 Body mass index (BMI) 22.0-22.9, adult: Secondary | ICD-10-CM

## 2023-05-03 DIAGNOSIS — C7801 Secondary malignant neoplasm of right lung: Secondary | ICD-10-CM | POA: Diagnosis present

## 2023-05-03 DIAGNOSIS — J449 Chronic obstructive pulmonary disease, unspecified: Secondary | ICD-10-CM | POA: Diagnosis present

## 2023-05-03 DIAGNOSIS — N179 Acute kidney failure, unspecified: Secondary | ICD-10-CM | POA: Diagnosis not present

## 2023-05-03 DIAGNOSIS — Z1152 Encounter for screening for COVID-19: Secondary | ICD-10-CM

## 2023-05-03 DIAGNOSIS — M5440 Lumbago with sciatica, unspecified side: Secondary | ICD-10-CM | POA: Diagnosis present

## 2023-05-03 DIAGNOSIS — R64 Cachexia: Secondary | ICD-10-CM | POA: Diagnosis present

## 2023-05-03 DIAGNOSIS — C187 Malignant neoplasm of sigmoid colon: Secondary | ICD-10-CM | POA: Diagnosis present

## 2023-05-03 DIAGNOSIS — N3000 Acute cystitis without hematuria: Secondary | ICD-10-CM

## 2023-05-03 DIAGNOSIS — Z66 Do not resuscitate: Secondary | ICD-10-CM | POA: Diagnosis present

## 2023-05-03 DIAGNOSIS — R918 Other nonspecific abnormal finding of lung field: Secondary | ICD-10-CM

## 2023-05-03 DIAGNOSIS — E785 Hyperlipidemia, unspecified: Secondary | ICD-10-CM | POA: Diagnosis present

## 2023-05-03 DIAGNOSIS — R627 Adult failure to thrive: Secondary | ICD-10-CM

## 2023-05-03 DIAGNOSIS — E43 Unspecified severe protein-calorie malnutrition: Secondary | ICD-10-CM | POA: Diagnosis present

## 2023-05-03 DIAGNOSIS — Z933 Colostomy status: Secondary | ICD-10-CM

## 2023-05-03 DIAGNOSIS — Z515 Encounter for palliative care: Secondary | ICD-10-CM

## 2023-05-03 DIAGNOSIS — I2489 Other forms of acute ischemic heart disease: Secondary | ICD-10-CM | POA: Diagnosis present

## 2023-05-03 DIAGNOSIS — F17210 Nicotine dependence, cigarettes, uncomplicated: Secondary | ICD-10-CM | POA: Diagnosis present

## 2023-05-03 DIAGNOSIS — Z720 Tobacco use: Secondary | ICD-10-CM

## 2023-05-03 DIAGNOSIS — C799 Secondary malignant neoplasm of unspecified site: Principal | ICD-10-CM

## 2023-05-03 DIAGNOSIS — A419 Sepsis, unspecified organism: Secondary | ICD-10-CM

## 2023-05-03 DIAGNOSIS — J439 Emphysema, unspecified: Secondary | ICD-10-CM | POA: Diagnosis present

## 2023-05-03 DIAGNOSIS — G8929 Other chronic pain: Secondary | ICD-10-CM | POA: Diagnosis present

## 2023-05-03 DIAGNOSIS — N390 Urinary tract infection, site not specified: Principal | ICD-10-CM | POA: Diagnosis present

## 2023-05-03 DIAGNOSIS — I700 Atherosclerosis of aorta: Secondary | ICD-10-CM | POA: Diagnosis present

## 2023-05-03 DIAGNOSIS — I5A Non-ischemic myocardial injury (non-traumatic): Secondary | ICD-10-CM

## 2023-05-03 DIAGNOSIS — Z7982 Long term (current) use of aspirin: Secondary | ICD-10-CM

## 2023-05-03 DIAGNOSIS — E119 Type 2 diabetes mellitus without complications: Secondary | ICD-10-CM | POA: Diagnosis present

## 2023-05-03 DIAGNOSIS — G893 Neoplasm related pain (acute) (chronic): Secondary | ICD-10-CM | POA: Diagnosis present

## 2023-05-03 LAB — COMPREHENSIVE METABOLIC PANEL WITH GFR
ALT: 37 U/L (ref 0–44)
AST: 25 U/L (ref 15–41)
Albumin: 2.1 g/dL — ABNORMAL LOW (ref 3.5–5.0)
Alkaline Phosphatase: 132 U/L — ABNORMAL HIGH (ref 38–126)
Anion gap: 10 (ref 5–15)
BUN: 56 mg/dL — ABNORMAL HIGH (ref 8–23)
CO2: 23 mmol/L (ref 22–32)
Calcium: 8.8 mg/dL — ABNORMAL LOW (ref 8.9–10.3)
Chloride: 110 mmol/L (ref 98–111)
Creatinine, Ser: 1.33 mg/dL — ABNORMAL HIGH (ref 0.61–1.24)
GFR, Estimated: 55 mL/min — ABNORMAL LOW (ref >60)
Glucose, Bld: 145 mg/dL — ABNORMAL HIGH (ref 70–99)
Potassium: 4.2 mmol/L (ref 3.5–5.1)
Sodium: 143 mmol/L (ref 135–145)
Total Bilirubin: 1 mg/dL (ref 0.0–1.2)
Total Protein: 6.8 g/dL (ref 6.5–8.1)

## 2023-05-03 LAB — CBC WITH DIFFERENTIAL/PLATELET
Abs Immature Granulocytes: 0.14 10*3/uL — ABNORMAL HIGH (ref 0.00–0.07)
Basophils Absolute: 0.1 10*3/uL (ref 0.0–0.1)
Basophils Relative: 0 %
Eosinophils Absolute: 0 10*3/uL (ref 0.0–0.5)
Eosinophils Relative: 0 %
HCT: 38.9 % — ABNORMAL LOW (ref 39.0–52.0)
Hemoglobin: 12.4 g/dL — ABNORMAL LOW (ref 13.0–17.0)
Immature Granulocytes: 1 %
Lymphocytes Relative: 2 %
Lymphs Abs: 0.5 10*3/uL — ABNORMAL LOW (ref 0.7–4.0)
MCH: 25.9 pg — ABNORMAL LOW (ref 26.0–34.0)
MCHC: 31.9 g/dL (ref 30.0–36.0)
MCV: 81.4 fL (ref 80.0–100.0)
Monocytes Absolute: 1.4 10*3/uL — ABNORMAL HIGH (ref 0.1–1.0)
Monocytes Relative: 7 %
Neutro Abs: 17.5 10*3/uL — ABNORMAL HIGH (ref 1.7–7.7)
Neutrophils Relative %: 90 %
Platelets: 205 10*3/uL (ref 150–400)
RBC: 4.78 MIL/uL (ref 4.22–5.81)
RDW: 17.3 % — ABNORMAL HIGH (ref 11.5–15.5)
WBC: 19.6 10*3/uL — ABNORMAL HIGH (ref 4.0–10.5)
nRBC: 0 % (ref 0.0–0.2)

## 2023-05-03 LAB — RESP PANEL BY RT-PCR (RSV, FLU A&B, COVID)  RVPGX2
Influenza A by PCR: NEGATIVE
Influenza B by PCR: NEGATIVE
Resp Syncytial Virus by PCR: NEGATIVE
SARS Coronavirus 2 by RT PCR: NEGATIVE

## 2023-05-03 LAB — PROTIME-INR
INR: 1.1 (ref 0.8–1.2)
Prothrombin Time: 14.6 s (ref 11.4–15.2)

## 2023-05-03 LAB — URINALYSIS, W/ REFLEX TO CULTURE (INFECTION SUSPECTED)
Bacteria, UA: NONE SEEN
Bilirubin Urine: NEGATIVE
Glucose, UA: NEGATIVE mg/dL
Ketones, ur: NEGATIVE mg/dL
Nitrite: NEGATIVE
Protein, ur: 30 mg/dL — AB
Specific Gravity, Urine: 1.035 — ABNORMAL HIGH (ref 1.005–1.030)
Squamous Epithelial / HPF: 0 /HPF (ref 0–5)
WBC, UA: >50 WBC/hpf (ref 0–5)
pH: 7 (ref 5.0–8.0)

## 2023-05-03 LAB — TROPONIN I (HIGH SENSITIVITY): Troponin I (High Sensitivity): 26 ng/L — ABNORMAL HIGH (ref <18)

## 2023-05-03 LAB — LACTIC ACID, PLASMA: Lactic Acid, Venous: 1.9 mmol/L (ref 0.5–1.9)

## 2023-05-03 MED ORDER — ACETAMINOPHEN 325 MG PO TABS
650.0000 mg | ORAL_TABLET | Freq: Four times a day (QID) | ORAL | Status: DC | PRN
  Administered 2023-05-03: 650 mg via ORAL
  Filled 2023-05-03: qty 2

## 2023-05-03 MED ORDER — DM-GUAIFENESIN ER 30-600 MG PO TB12: 1.0000 | ORAL_TABLET | Freq: Two times a day (BID) | ORAL | Status: DC | PRN

## 2023-05-03 MED ORDER — IOHEXOL 350 MG/ML SOLN
100.0000 mL | Freq: Once | INTRAVENOUS | Status: AC | PRN
  Administered 2023-05-03: 100 mL via INTRAVENOUS

## 2023-05-03 MED ORDER — NICOTINE 21 MG/24HR TD PT24
21.0000 mg | MEDICATED_PATCH | Freq: Every day | TRANSDERMAL | Status: DC
  Administered 2023-05-03 – 2023-05-05 (×3): 21 mg via TRANSDERMAL
  Filled 2023-05-03 (×3): qty 1

## 2023-05-03 MED ORDER — METRONIDAZOLE 500 MG/100ML IV SOLN
500.0000 mg | Freq: Once | INTRAVENOUS | Status: AC
  Administered 2023-05-03: 500 mg via INTRAVENOUS
  Filled 2023-05-03: qty 100

## 2023-05-03 MED ORDER — SODIUM CHLORIDE 0.9 % IV BOLUS
1000.0000 mL | Freq: Once | INTRAVENOUS | Status: AC
  Administered 2023-05-03: 1000 mL via INTRAVENOUS

## 2023-05-03 MED ORDER — VANCOMYCIN HCL IN DEXTROSE 1-5 GM/200ML-% IV SOLN
1000.0000 mg | Freq: Once | INTRAVENOUS | Status: AC
  Administered 2023-05-03: 1000 mg via INTRAVENOUS
  Filled 2023-05-03: qty 200

## 2023-05-03 MED ORDER — SODIUM CHLORIDE 0.9 % IV SOLN
2.0000 g | Freq: Once | INTRAVENOUS | Status: AC
  Administered 2023-05-03: 2 g via INTRAVENOUS
  Filled 2023-05-03: qty 12.5

## 2023-05-03 MED ORDER — HYDRALAZINE HCL 20 MG/ML IJ SOLN: 5.0000 mg | INTRAMUSCULAR | Status: DC | PRN

## 2023-05-03 MED ORDER — ONDANSETRON HCL 4 MG/2ML IJ SOLN: 4.0000 mg | Freq: Three times a day (TID) | INTRAMUSCULAR | Status: DC | PRN

## 2023-05-03 MED ORDER — ENOXAPARIN SODIUM 40 MG/0.4ML IJ SOSY
40.0000 mg | PREFILLED_SYRINGE | INTRAMUSCULAR | Status: DC
  Administered 2023-05-03 – 2023-05-04 (×2): 40 mg via SUBCUTANEOUS
  Filled 2023-05-03 (×2): qty 0.4

## 2023-05-03 MED ORDER — ALBUTEROL SULFATE (2.5 MG/3ML) 0.083% IN NEBU: 2.5000 mg | INHALATION_SOLUTION | RESPIRATORY_TRACT | Status: DC | PRN

## 2023-05-03 NOTE — ED Provider Notes (Signed)
 [facility] Provider Note    Event Date/Time   First MD Initiated Contact with Patient 05/03/23 1459     (approximate)   History   Failure To Thrive   HPI  Cameron Howe is a 78 y.o. male  follows with Dr Adrian Alba for mass of the right lung who comes in with concerns for failure to thrive.  [facility].  Patient sent in for low blood pressures.  I reviewed the note where patient was admitted from 3/21 until 4/1.  Patient had worsening sciatica pain preventing him from doing his ADLs.  He had MRI spine that did show a bone lesion in the left gluteal area that he supposed to have worked up with a PET scan and biopsy.    This is patient's is present with the sister.  They report over the past few days they have noticed that his speech has changed and that he seems more weak.  He is not even able to stop in a wheelchair compared to previous likely he was able to get around in a wheelchair and have conversations.  They noted some low blood pressures and him just not acting his normal self.  This has been for a few days now.  She wonders if he could have had a stroke.   Physical Exam   Triage Vital Signs: ED Triage Vitals  Encounter Vitals Group     BP 05/03/23 1359 119/77     Systolic BP Percentile --      Diastolic BP Percentile --      Pulse Rate 05/03/23 1359 100     Resp 05/03/23 1359 18     Temp 05/03/23 1359 98 F (36.7 C)     Temp src --      SpO2 05/03/23 1359 96 %     Weight 05/03/23 1403 163 lb 2.3 oz (74 kg)     Height 05/03/23 1403 6' (1.829 m)     Head Circumference --      [facility] --      Pain Score 05/03/23 1359 0     Pain Loc --      Pain Education --      Exclude from Growth Chart --     Most recent vital signs: Vitals:   05/03/23 1359 05/03/23 1430  BP: 119/77 119/79  Pulse: 100 95  Resp: 18   Temp: 98 F (36.7 C)   SpO2: 96% 96%     General: Awake, no distress.  CV:  Good peripheral  perfusion.  Resp:  Normal effort.  On 2 L Abd:  No distention.  Slightly tender in the abdomen Other:  Equal grip strength able to move bilateral toes. Alert and oriented x 3  ED Results / Procedures / Treatments   Labs (all labs ordered are listed, but only abnormal results are displayed) Labs Reviewed  COMPREHENSIVE METABOLIC PANEL WITH GFR - Abnormal; Notable for the following components:      Result Value   Glucose, Bld 145 (*)    BUN 56 (*)    Creatinine, Ser 1.33 (*)    Calcium 8.8 (*)    Albumin 2.1 (*)    Alkaline Phosphatase 132 (*)    GFR, Estimated 55 (*)    All other components within normal limits  CBC WITH DIFFERENTIAL/PLATELET - Abnormal; Notable for the following components:   WBC 19.6 (*)    Hemoglobin 12.4 (*)    HCT  38.9 (*)    MCH 25.9 (*)    RDW 17.3 (*)    Neutro Abs 17.5 (*)    Lymphs Abs 0.5 (*)    Monocytes Absolute 1.4 (*)    Abs Immature Granulocytes 0.14 (*)    All other components within normal limits  URINALYSIS, W/ REFLEX TO CULTURE (INFECTION SUSPECTED)     EKG  My interpretation of EKG:  Sinus tachycardia rate of 103 without any ST elevation or T wave inversions, normal intervals  RADIOLOGY I have reviewed the xray personally and interpreted no ICH    PROCEDURES:  Critical Care performed: Yes, see critical care procedure note(s)  .Critical Care  Performed by: Lubertha Rush, MD Authorized by: Lubertha Rush, MD   Critical care provider statement:    Critical care time (minutes):  30   Critical care was necessary to treat or prevent imminent or life-threatening deterioration of the following conditions:  Sepsis   Critical care was time spent personally by me on the following activities:  Development of treatment plan with patient or surrogate, discussions with consultants, evaluation of patient's response to treatment, examination of patient, ordering and review of laboratory studies, ordering and review of radiographic studies,  ordering and performing treatments and interventions, pulse oximetry, re-evaluation of patient's condition and review of old charts .1-3 Lead EKG Interpretation  Performed by: Lubertha Rush, MD Authorized by: Lubertha Rush, MD     Interpretation: normal     ECG rate:  80   ECG rate assessment: normal     Rhythm: sinus tachycardia     Ectopy: none     Conduction: normal      MEDICATIONS ORDERED IN ED: Medications  vancomycin  (VANCOCIN ) IVPB 1000 mg/200 mL premix (1,000 mg Intravenous New Bag/Given 05/03/23 1754)  ceFEPIme  (MAXIPIME ) 2 g in sodium chloride  0.9 % 100 mL IVPB (0 g Intravenous Stopped 05/03/23 1631)  metroNIDAZOLE  (FLAGYL ) IVPB 500 mg (0 mg Intravenous Stopped 05/03/23 1751)  sodium chloride  0.9 % bolus 1,000 mL (0 mLs Intravenous Stopped 05/03/23 1841)  iohexol  (OMNIPAQUE ) 350 MG/ML injection 100 mL (100 mLs Intravenous Contrast Given 05/03/23 1614)     IMPRESSION / MDM / ASSESSMENT AND PLAN / ED COURSE  I reviewed the triage vital signs and the nursing notes.   Patient's presentation is most consistent with acute presentation with potential threat to life or bodily function.   Patient comes in with concerns for failure to thrive suspect dehydration, Electra abnormalities, UTI.  Given the new change in speech will get CT head to rule out any type of intracranial hemorrhage, large mass, CT PE to rule out pulmonary embolism, CT abdomen given some abdominal pain with elevated white count.  I did have a lengthy discussion with both patient and his POA who is the sister about whether or not they would want IV fluids IV antibiotics and CTs to evaluate for infections.  They stated that they would like to proceed with this workup and that their goal is to get to this biopsy next week so he can get some palliative treatment to help with his quality of life.  They are not interested at this time and holding off on this interventions and just focusing on comfort.   CBC shows elevated  white count of 19 with left shift.  CMP shows increasing creatinine, BUN. COVID, flu were negative.  Lactate reassuring.  Urine does look consistent with UTI we will add on urine culture.  Discussed with family  patient's concern for sepsis, UTI, AKI.  They would prefer admission to the hospital.  They understand that if patient is not getting better with antibiotics that more goals of care conversations will need to be hide and they expressed understanding.  Did discuss with them that the CT imaging did show worsening metastatic disease and they are aware.  The patient is on the cardiac monitor to evaluate for evidence of arrhythmia and/or significant heart rate changes.      FINAL CLINICAL IMPRESSION(S) / ED DIAGNOSES   Final diagnoses:  Metastatic malignant neoplasm, unspecified site (HCC)  AKI (acute kidney injury) (HCC)  Sepsis, due to unspecified organism, unspecified whether acute organ dysfunction present [facility])  Urinary tract infection without hematuria, site unspecified     Rx / DC Orders   ED Discharge Orders     None        Note:  This document was prepared using Dragon voice recognition software and may include unintentional dictation errors.   Lubertha Rush, MD 05/03/23 [phone]

## 2023-05-03 NOTE — ED Provider Notes (Signed)
 MSE was initiated and I personally evaluated the patient and placed orders (if any) at  2:23 PM on May 03, 2023.  The patient appears stable so that the remainder of the MSE may be completed by another provider.   Jacquie Maudlin, MD 05/03/23 [phone]

## 2023-05-03 NOTE — ED Notes (Signed)
Pt and family verbalized no needs at this time.

## 2023-05-03 NOTE — ED Notes (Signed)
 Pt with wet incontinence brief soaked through to sheets. New bed sheets, absorbent pad, and incontinence brief placed. Perineal care performed.

## 2023-05-03 NOTE — H&P (Signed)
 History and Physical    Cameron Howe ZOX:[id number] DOB: 1945-05-21 DOA: 05/03/2023  Referring MD/NP/PA:   PCP: Antonio Baumgarten, MD   Patient coming from:  The patient is coming from home.     Chief Complaint:   HPI: Cameron Howe is a 78 y.o. male with medical history significant of      Data reviewed independently and ED Course: pt was found to have     ***       EKG: I have personally reviewed.  Not done in ED, will get one.   ***   Review of Systems:   General: no fevers, chills, no body weight gain, has poor appetite, has fatigue HEENT: no blurry vision, hearing changes or sore throat Respiratory: no dyspnea, coughing, wheezing CV: no chest pain, no palpitations GI: no nausea, vomiting, abdominal pain, diarrhea, constipation GU: no dysuria, burning on urination, increased urinary frequency, hematuria  Ext: no leg edema Neuro: no unilateral weakness, numbness, or tingling, no vision change or hearing loss Skin: no rash, no skin tear. MSK: No muscle spasm, no deformity, no limitation of range of movement in spin Heme: No easy bruising.  Travel history: No recent long distant travel.   Allergy: No Known Allergies  Past Medical History:  Diagnosis Date   Arthritis    SHOULDERS AND JOINTS   Cancer [facility])    Colon cancer 11/2010 with parital resection with chemo and rad tx   COPD (chronic obstructive pulmonary disease) (HCC)    Dental bridge present    REMOVABLE   Diabetes mellitus without complication (HCC)    TYPE 2, DIET CONTROLLED   Elevated PSA    Hypertension    Neuromuscular disorder (HCC)    WEAKNESS AND NUMBNESS HANDS AND FEET, POSSIBLY FROM CHEMO   Tobacco abuse     Past Surgical History:  Procedure Laterality Date   CERVICAL DISCECTOMY     CERVICAL FUSION     COLON SURGERY  NOV 2012   COLON REMOVED   COLON SURGERY  APRIL 2014   OSTOMY REMOVED   COLONOSCOPY N/A 04/21/2015   Procedure: COLONOSCOPY;  Surgeon: Stephens Eis, MD;  Location:  [facility];  Service: Gastroenterology;  Laterality: N/A;  DIABETIC, DIET CONTROLLED   COLONOSCOPY WITH PROPOFOL  N/A 11/07/2018   Procedure: COLONOSCOPY WITH PROPOFOL ;  Surgeon: Marshall Skeeter, MD;  Location: [facility];  Service: Endoscopy;  Laterality: N/A;   COLOSTOMY REVISION     hx of colostomy     LAMINECTOMY     POLYPECTOMY N/A 04/21/2015   Procedure: POLYPECTOMY;  Surgeon: Stephens Eis, MD;  Location: [facility];  Service: Gastroenterology;  Laterality: N/A;   TUNNELED VENOUS PORT PLACEMENT      Social History:  reports that he has been smoking cigarettes. He has a 15 pack-year smoking history. He has never used smokeless tobacco. He reports that he does not drink alcohol and does not use drugs.  Family History: History reviewed. No pertinent family history.   Prior to Admission medications   Medication Sig Start Date End Date Taking? Authorizing Provider  acetaminophen  (TYLENOL ) 650 MG CR tablet Take 650 mg by mouth every 8 (eight) hours as needed for pain.    [provider]  albuterol  (VENTOLIN  HFA) 108 (90 Base) MCG/ACT inhaler Inhale 2 puffs into the lungs every 6 (six) hours as needed for wheezing or shortness of breath. 04/11/23   Althia Atlas, MD  aspirin  81 MG tablet Take 81 mg by  mouth daily. 800AM    [provider]  atorvastatin (LIPITOR) 40 MG tablet Take 40 mg by mouth daily. 10/20/22 05/14/23  [provider]  bisacodyl  (DULCOLAX) 10 MG suppository Place 1 suppository (10 mg total) rectally daily as needed for severe constipation. 04/11/23   Althia Atlas, MD  bisacodyl  (DULCOLAX) 5 MG EC tablet Take 2 tablets (10 mg total) by mouth daily as needed for moderate constipation. 04/11/23   Althia Atlas, MD  fentaNYL  (DURAGESIC ) 12 MCG/HR Place 1 patch onto the skin every 3 (three) days. 04/11/23   Althia Atlas, MD  furosemide (LASIX) 20 MG tablet Take 20 mg by mouth daily. 10/21/22 05/14/23  [provider]  hydrALAZINE   (APRESOLINE ) 50 MG tablet Take 1 tablet (50 mg total) by mouth every 6 (six) hours as needed (SBP >150). 04/11/23   Althia Atlas, MD  lidocaine  (LIDODERM ) 5 % Place 1 patch onto the skin daily. Remove & Discard patch within 12 hours or as directed by MD 03/16/23   Sung, Jade J, MD  metoprolol  tartrate (LOPRESSOR ) 25 MG tablet Take 1 tablet (25 mg total) by mouth 2 (two) times daily. 04/11/23   Althia Atlas, MD  oxyCODONE  (OXY IR/ROXICODONE ) 5 MG immediate release tablet Take 1 tablet (5 mg total) by mouth every 6 (six) hours as needed for moderate pain (pain score 4-6) or severe pain (pain score 7-10). 04/11/23   Althia Atlas, MD  pantoprazole  (PROTONIX ) 40 MG tablet Take 1 tablet (40 mg total) by mouth daily. 04/12/23   Althia Atlas, MD  polyethylene glycol (MIRALAX  / GLYCOLAX ) 17 g packet Take 17 g by mouth daily. Skip the dose if no constipation 04/11/23   Althia Atlas, MD  traZODone  (DESYREL ) 50 MG tablet Take 0.5 tablets (25 mg total) by mouth at bedtime as needed for sleep. 04/11/23   Althia Atlas, MD  umeclidinium-vilanterol (ANORO ELLIPTA ) 62.5-25 MCG/ACT AEPB Inhale 1 puff into the lungs daily. 04/11/23   Althia Atlas, MD    Physical Exam: Vitals:   05/03/23 1715 05/03/23 1730 05/03/23 1800 05/03/23 1830  BP:  (!) 111/54 (!) 111/47 (!) 126/50  Pulse:  86 81 95  Resp: 13  (!) 26 (!) 25  Temp:      SpO2:  99% 100% 100%  Weight:      Height:       General: Not in acute distress HEENT:       Eyes: PERRL, EOMI, no jaundice       ENT: No discharge from the ears and nose, no pharynx injection, no tonsillar enlargement.        Neck: No JVD, no bruit, no mass felt. Heme: No neck lymph node enlargement. Cardiac: S1/S2, RRR, No murmurs, No gallops or rubs. Respiratory: No rales, wheezing, rhonchi or rubs. GI: Soft, nondistended, nontender, no rebound pain, no organomegaly, BS present. GU: No hematuria Ext: No pitting leg edema bilaterally. 1+DP/PT pulse bilaterally. Musculoskeletal: No joint  deformities, No joint redness or warmth, no limitation of ROM in spin. Skin: No rashes.  Neuro: Alert, oriented X3, cranial nerves II-XII grossly intact, moves all extremities normally. Muscle strength 5/5 in all extremities, sensation to light touch intact. Brachial reflex 2+ bilaterally. Knee reflex 1+ bilaterally. Negative Babinski's sign. Normal finger to nose test. Psych: Patient is not psychotic, no suicidal or hemocidal ideation.  Labs on Admission: I have personally reviewed following labs and imaging studies  CBC: Recent Labs  Lab 05/03/23 1413  WBC 19.6*  NEUTROABS 17.5*  HGB 12.4*  HCT 38.9*  MCV 81.4  PLT 205   Basic Metabolic Panel: Recent Labs  Lab 05/03/23 1413  NA 143  K 4.2  CL 110  CO2 23  GLUCOSE 145*  BUN 56*  CREATININE 1.33*  CALCIUM 8.8*   GFR: Estimated Creatinine Clearance: 48.7 mL/min (A) (by C-G formula based on SCr of 1.33 mg/dL (H)). Liver Function Tests: Recent Labs  Lab 05/03/23 1413  AST 25  ALT 37  ALKPHOS 132*  BILITOT 1.0  PROT 6.8  ALBUMIN 2.1*   No results for input(s): "LIPASE", "AMYLASE" in the last 168 hours. No results for input(s): "AMMONIA" in the last 168 hours. Coagulation Profile: Recent Labs  Lab 05/03/23 1540  INR 1.1   Cardiac Enzymes: No results for input(s): "CKTOTAL", "CKMB", "CKMBINDEX", "TROPONINI" in the last 168 hours. BNP (last 3 results) No results for input(s): "PROBNP" in the last 8760 hours. HbA1C: No results for input(s): "HGBA1C" in the last 72 hours. CBG: No results for input(s): "GLUCAP" in the last 168 hours. Lipid Profile: No results for input(s): "CHOL", "HDL", "LDLCALC", "TRIG", "CHOLHDL", "LDLDIRECT" in the last 72 hours. Thyroid Function Tests: No results for input(s): "TSH", "T4TOTAL", "FREET4", "T3FREE", "THYROIDAB" in the last 72 hours. Anemia Panel: No results for input(s): "VITAMINB12", "FOLATE", "FERRITIN", "TIBC", "IRON", "RETICCTPCT" in the last 72 hours. Urine analysis:     Component Value Date/Time   COLORURINE YELLOW (A) 05/03/2023 1540   APPEARANCEUR CLOUDY (A) 05/03/2023 1540   LABSPEC 1.035 (H) 05/03/2023 1540   PHURINE 7.0 05/03/2023 1540   GLUCOSEU NEGATIVE 05/03/2023 1540   HGBUR SMALL (A) 05/03/2023 1540   BILIRUBINUR NEGATIVE 05/03/2023 1540   KETONESUR NEGATIVE 05/03/2023 1540   PROTEINUR 30 (A) 05/03/2023 1540   NITRITE NEGATIVE 05/03/2023 1540   LEUKOCYTESUR LARGE (A) 05/03/2023 1540   Sepsis Labs: @LABRCNTIP (procalcitonin:4,lacticidven:4) ) Recent Results (from the past 240 hours)  Resp panel by RT-PCR (RSV, Flu A&B, Covid) Anterior Nasal Swab     Status: None   Collection Time: 05/03/23  3:40 PM   Specimen: Anterior Nasal Swab  Result Value Ref Range Status   SARS Coronavirus 2 by RT PCR NEGATIVE NEGATIVE Final    Comment: (NOTE) SARS-CoV-2 target nucleic acids are NOT DETECTED.  The SARS-CoV-2 RNA is generally detectable in upper respiratory specimens during the acute phase of infection. The lowest concentration of SARS-CoV-2 viral copies this assay can detect is 138 copies/mL. A negative result does not preclude SARS-Cov-2 infection and should not be used as the sole basis for treatment or other patient management decisions. A negative result may occur with  improper specimen collection/handling, submission of specimen other than nasopharyngeal swab, presence of viral mutation(s) within the areas targeted by this assay, and inadequate number of viral copies(<138 copies/mL). A negative result must be combined with clinical observations, patient history, and epidemiological information. The expected result is Negative.  Fact Sheet for Patients:  BloggerCourse.com  Fact Sheet for Healthcare Providers:  SeriousBroker.it  This test is no t yet approved or cleared by the [place]  FDA and  has been authorized for detection and/or diagnosis of SARS-CoV-2 by FDA under an  Emergency Use Authorization (EUA). This EUA will remain  in effect (meaning this test can be used) for the duration of the COVID-19 declaration under Section 564(b)(1) of the Act, 21 U.S.C.section 360bbb-3(b)(1), unless the authorization is terminated  or revoked sooner.       Influenza A by PCR NEGATIVE NEGATIVE Final   Influenza B by PCR NEGATIVE NEGATIVE  Final    Comment: (NOTE) The Xpert Xpress SARS-CoV-2/FLU/RSV plus assay is intended as an aid in the diagnosis of influenza from Nasopharyngeal swab specimens and should not be used as a sole basis for treatment. Nasal washings and aspirates are unacceptable for Xpert Xpress SARS-CoV-2/FLU/RSV testing.  Fact Sheet for Patients: BloggerCourse.com  Fact Sheet for Healthcare Providers: SeriousBroker.it  This test is not yet approved or cleared by the [place]  FDA and has been authorized for detection and/or diagnosis of SARS-CoV-2 by FDA under an Emergency Use Authorization (EUA). This EUA will remain in effect (meaning this test can be used) for the duration of the COVID-19 declaration under Section 564(b)(1) of the Act, 21 U.S.C. section 360bbb-3(b)(1), unless the authorization is terminated or revoked.     Resp Syncytial Virus by PCR NEGATIVE NEGATIVE Final    Comment: (NOTE) Fact Sheet for Patients: BloggerCourse.com  Fact Sheet for Healthcare Providers: SeriousBroker.it  This test is not yet approved or cleared by the [place]  FDA and has been authorized for detection and/or diagnosis of SARS-CoV-2 by FDA under an Emergency Use Authorization (EUA). This EUA will remain in effect (meaning this test can be used) for the duration of the COVID-19 declaration under Section 564(b)(1) of the Act, 21 U.S.C. section 360bbb-3(b)(1), unless the authorization is terminated or revoked.  Performed at [facility], [address], [place], [place] [address]      Radiological Exams on Admission:   Assessment/Plan Active Problems:   * No active hospital problems. *   Assessment and Plan: No notes have been filed under this hospital service. Service: Hospitalist      Active Problems:   * No active hospital problems. *    DVT ppx: SQ Heparin          SQ Lovenox   Code Status: Full code   ***  Family Communication:     not done, no family member is at bed side.              Yes, patient's    at bed side.       by phone   ***  Disposition Plan:  Anticipate discharge back to previous environment  Consults called:    Admission status and Level of care: :    for obs as inpt        Dispo: The patient is from: {From:23814}              Anticipated d/c is to: {To:23815}              Anticipated d/c date is: {Days:23816}              Patient currently {Medically stable:23817}    Severity of Illness:  {Observation/Inpatient:21159}       Date of Service 05/03/2023    Fidencio Hue [facility]   If 7PM-7AM, please contact night-coverage [email or web address] 05/03/2023, 7:17 PM,hpinew

## 2023-05-03 NOTE — ED Notes (Signed)
 Patient transported to CT

## 2023-05-03 NOTE — ED Triage Notes (Signed)
 Pt presents to the ED via [facility] from [facility]. Pt presents with MOST form. Pt is currently being treated for lung cancer. Pt was sent here for hypotension. VS sheet sent with patient shows no systolic BP under 101 since at least 04/29/22 per vs sheet. Last BP 108/60. Sister states that patient has been steadily declining over the past week. Pt does have stage four lung cancer.  BP 104/78 HR 100 SPO2 98 on 2L [place] (baseline) Temp 98.6

## 2023-05-04 ENCOUNTER — Other Ambulatory Visit: Payer: Self-pay

## 2023-05-04 DIAGNOSIS — N3000 Acute cystitis without hematuria: Secondary | ICD-10-CM | POA: Diagnosis not present

## 2023-05-04 DIAGNOSIS — C7801 Secondary malignant neoplasm of right lung: Secondary | ICD-10-CM | POA: Diagnosis present

## 2023-05-04 DIAGNOSIS — Z66 Do not resuscitate: Secondary | ICD-10-CM | POA: Diagnosis present

## 2023-05-04 DIAGNOSIS — E119 Type 2 diabetes mellitus without complications: Secondary | ICD-10-CM | POA: Diagnosis present

## 2023-05-04 DIAGNOSIS — Z1152 Encounter for screening for COVID-19: Secondary | ICD-10-CM | POA: Diagnosis not present

## 2023-05-04 DIAGNOSIS — N179 Acute kidney failure, unspecified: Secondary | ICD-10-CM | POA: Diagnosis present

## 2023-05-04 DIAGNOSIS — M5440 Lumbago with sciatica, unspecified side: Secondary | ICD-10-CM | POA: Diagnosis present

## 2023-05-04 DIAGNOSIS — G893 Neoplasm related pain (acute) (chronic): Secondary | ICD-10-CM | POA: Diagnosis present

## 2023-05-04 DIAGNOSIS — E43 Unspecified severe protein-calorie malnutrition: Secondary | ICD-10-CM | POA: Diagnosis present

## 2023-05-04 DIAGNOSIS — C187 Malignant neoplasm of sigmoid colon: Secondary | ICD-10-CM | POA: Diagnosis present

## 2023-05-04 DIAGNOSIS — R627 Adult failure to thrive: Secondary | ICD-10-CM | POA: Diagnosis present

## 2023-05-04 DIAGNOSIS — Z7982 Long term (current) use of aspirin: Secondary | ICD-10-CM | POA: Diagnosis not present

## 2023-05-04 DIAGNOSIS — Z933 Colostomy status: Secondary | ICD-10-CM | POA: Diagnosis not present

## 2023-05-04 DIAGNOSIS — I2489 Other forms of acute ischemic heart disease: Secondary | ICD-10-CM | POA: Diagnosis present

## 2023-05-04 DIAGNOSIS — M5441 Lumbago with sciatica, right side: Secondary | ICD-10-CM | POA: Diagnosis not present

## 2023-05-04 DIAGNOSIS — I700 Atherosclerosis of aorta: Secondary | ICD-10-CM | POA: Diagnosis present

## 2023-05-04 DIAGNOSIS — I5A Non-ischemic myocardial injury (non-traumatic): Secondary | ICD-10-CM | POA: Diagnosis present

## 2023-05-04 DIAGNOSIS — R64 Cachexia: Secondary | ICD-10-CM | POA: Diagnosis present

## 2023-05-04 DIAGNOSIS — N390 Urinary tract infection, site not specified: Secondary | ICD-10-CM | POA: Diagnosis present

## 2023-05-04 DIAGNOSIS — I10 Essential (primary) hypertension: Secondary | ICD-10-CM | POA: Diagnosis present

## 2023-05-04 DIAGNOSIS — F17210 Nicotine dependence, cigarettes, uncomplicated: Secondary | ICD-10-CM | POA: Diagnosis present

## 2023-05-04 DIAGNOSIS — E785 Hyperlipidemia, unspecified: Secondary | ICD-10-CM | POA: Diagnosis present

## 2023-05-04 DIAGNOSIS — R918 Other nonspecific abnormal finding of lung field: Secondary | ICD-10-CM | POA: Diagnosis not present

## 2023-05-04 DIAGNOSIS — J449 Chronic obstructive pulmonary disease, unspecified: Secondary | ICD-10-CM | POA: Diagnosis not present

## 2023-05-04 DIAGNOSIS — Z6822 Body mass index (BMI) 22.0-22.9, adult: Secondary | ICD-10-CM | POA: Diagnosis not present

## 2023-05-04 DIAGNOSIS — Z515 Encounter for palliative care: Secondary | ICD-10-CM

## 2023-05-04 DIAGNOSIS — J439 Emphysema, unspecified: Secondary | ICD-10-CM | POA: Diagnosis present

## 2023-05-04 DIAGNOSIS — C799 Secondary malignant neoplasm of unspecified site: Secondary | ICD-10-CM | POA: Diagnosis not present

## 2023-05-04 LAB — CBC
HCT: 34.6 % — ABNORMAL LOW (ref 39.0–52.0)
Hemoglobin: 11 g/dL — ABNORMAL LOW (ref 13.0–17.0)
MCH: 26.1 pg (ref 26.0–34.0)
MCHC: 31.8 g/dL (ref 30.0–36.0)
MCV: 82.2 fL (ref 80.0–100.0)
Platelets: 180 10*3/uL (ref 150–400)
RBC: 4.21 MIL/uL — ABNORMAL LOW (ref 4.22–5.81)
RDW: 18.1 % — ABNORMAL HIGH (ref 11.5–15.5)
WBC: 19.7 10*3/uL — ABNORMAL HIGH (ref 4.0–10.5)
nRBC: 0 % (ref 0.0–0.2)

## 2023-05-04 LAB — BASIC METABOLIC PANEL WITH GFR
Anion gap: 11 (ref 5–15)
BUN: 52 mg/dL — ABNORMAL HIGH (ref 8–23)
CO2: 22 mmol/L (ref 22–32)
Calcium: 8.5 mg/dL — ABNORMAL LOW (ref 8.9–10.3)
Chloride: 113 mmol/L — ABNORMAL HIGH (ref 98–111)
Creatinine, Ser: 1.23 mg/dL (ref 0.61–1.24)
GFR, Estimated: >60 mL/min (ref >60)
Glucose, Bld: 106 mg/dL — ABNORMAL HIGH (ref 70–99)
Potassium: 3.8 mmol/L (ref 3.5–5.1)
Sodium: 146 mmol/L — ABNORMAL HIGH (ref 135–145)

## 2023-05-04 LAB — TROPONIN I (HIGH SENSITIVITY)
Troponin I (High Sensitivity): 18 ng/L — ABNORMAL HIGH (ref <18)
Troponin I (High Sensitivity): 23 ng/L — ABNORMAL HIGH (ref <18)
Troponin I (High Sensitivity): 30 ng/L — ABNORMAL HIGH (ref <18)

## 2023-05-04 MED ORDER — LIDOCAINE 5 % EX PTCH
1.0000 | MEDICATED_PATCH | CUTANEOUS | Status: DC
  Administered 2023-05-04 – 2023-05-05 (×2): 1 via TRANSDERMAL
  Filled 2023-05-04 (×2): qty 1

## 2023-05-04 MED ORDER — SODIUM CHLORIDE 0.9 % IV SOLN
1.0000 g | INTRAVENOUS | Status: DC
  Administered 2023-05-04 – 2023-05-05 (×2): 1 g via INTRAVENOUS
  Filled 2023-05-04 (×2): qty 10

## 2023-05-04 MED ORDER — ASPIRIN 81 MG PO TBEC
81.0000 mg | DELAYED_RELEASE_TABLET | Freq: Every day | ORAL | Status: DC
Start: 2023-05-04 — End: 2023-05-05
  Administered 2023-05-04 – 2023-05-05 (×2): 81 mg via ORAL
  Filled 2023-05-04 (×2): qty 1

## 2023-05-04 MED ORDER — POLYETHYLENE GLYCOL 3350 17 G PO PACK
17.0000 g | PACK | Freq: Every day | ORAL | Status: DC
  Administered 2023-05-04 – 2023-05-05 (×2): 17 g via ORAL
  Filled 2023-05-04 (×2): qty 1

## 2023-05-04 MED ORDER — ENSURE ENLIVE PO LIQD
237.0000 mL | Freq: Two times a day (BID) | ORAL | Status: DC
Start: 2023-05-04 — End: 2023-05-05
  Administered 2023-05-04 – 2023-05-05 (×2): 237 mL via ORAL

## 2023-05-04 MED ORDER — OXYCODONE HCL 5 MG PO TABS
5.0000 mg | ORAL_TABLET | Freq: Four times a day (QID) | ORAL | Status: DC | PRN
  Administered 2023-05-04 – 2023-05-05 (×2): 5 mg via ORAL
  Filled 2023-05-04 (×2): qty 1

## 2023-05-04 MED ORDER — TRAZODONE HCL 50 MG PO TABS
25.0000 mg | ORAL_TABLET | Freq: Every evening | ORAL | Status: DC | PRN
  Administered 2023-05-04: 25 mg via ORAL
  Filled 2023-05-04: qty 1

## 2023-05-04 MED ORDER — MORPHINE SULFATE (PF) 2 MG/ML IV SOLN
2.0000 mg | INTRAVENOUS | Status: DC | PRN
  Administered 2023-05-04 – 2023-05-05 (×4): 2 mg via INTRAVENOUS
  Filled 2023-05-04 (×4): qty 1

## 2023-05-04 MED ORDER — PANTOPRAZOLE SODIUM 40 MG PO TBEC
40.0000 mg | DELAYED_RELEASE_TABLET | Freq: Every day | ORAL | Status: DC
  Administered 2023-05-04 – 2023-05-05 (×2): 40 mg via ORAL
  Filled 2023-05-04 (×2): qty 1

## 2023-05-04 MED ORDER — UMECLIDINIUM-VILANTEROL 62.5-25 MCG/ACT IN AEPB
1.0000 | INHALATION_SPRAY | Freq: Every day | RESPIRATORY_TRACT | Status: DC
  Administered 2023-05-04 – 2023-05-05 (×2): 1 via RESPIRATORY_TRACT
  Filled 2023-05-04: qty 14

## 2023-05-04 MED ORDER — FENTANYL 12 MCG/HR TD PT72: 1.0000 | MEDICATED_PATCH | TRANSDERMAL | Status: DC

## 2023-05-04 MED ORDER — METOPROLOL TARTRATE 25 MG PO TABS
25.0000 mg | ORAL_TABLET | Freq: Two times a day (BID) | ORAL | Status: DC
  Administered 2023-05-04 – 2023-05-05 (×3): 25 mg via ORAL
  Filled 2023-05-04 (×3): qty 1

## 2023-05-04 MED ORDER — BISACODYL 10 MG RE SUPP: 10.0000 mg | Freq: Every day | RECTAL | Status: DC | PRN

## 2023-05-04 NOTE — Consult Note (Signed)
 Palliative Medicine [facility] at [facility] [phone] [phone]) [phone]   Name: Cameron Howe Date: 05/04/2023 MRN: [id number]  DOB: [birth year]  Patient Care Team: Antonio Baumgarten, MD as PCP - General (Internal Medicine) Rochell Chroman, RN as Oncology Nurse Navigator Drake Gens, RN as Oncology Nurse Navigator Adrian Howe, Cameron Everts, MD as Consulting Physician (Oncology)    REASON FOR CONSULTATION: Cameron Howe is a 78 y.o. male with multiple medical problems including history of stage IIIc adenocarcinoma of the colon, COPD, DM2, hypertension, tobacco abuse.  Patient has history of a lung mass in October 2024 followed at [facility] but patient opted not to proceed with any further workup or treatment.  Patient was hospitalized 03/31/2023 to 04/11/2023 with back pain and right-sided sciatica.  Found to have right upper lobe lung mass with widespread metastasis.  Patient now readmitted 05/03/2023 with failure to thrive.  Palliative care consulted to address goals and manage ongoing symptoms.  SOCIAL HISTORY:     reports that he has been smoking cigarettes. He has a 15 pack-year smoking history. He has never used smokeless tobacco. He reports that he does not drink alcohol and does not use drugs.  Patient is a widower.  Lives at home alone.  Has a son from whom he is estranged.  His sister and brother-in-law live next-door.  Patient retired as a Agricultural consultant.  ADVANCE DIRECTIVES:  Does not have  CODE STATUS: DNR  PAST MEDICAL HISTORY: Past Medical History:  Diagnosis Date   Arthritis    SHOULDERS AND JOINTS   Cancer [facility])    Colon cancer 11/2010 with parital resection with chemo and rad tx   COPD (chronic obstructive pulmonary disease) (HCC)    Dental bridge present    REMOVABLE   Diabetes mellitus without complication (HCC)    TYPE 2, DIET CONTROLLED   Elevated PSA    Hypertension    Neuromuscular disorder (HCC)     WEAKNESS AND NUMBNESS HANDS AND FEET, POSSIBLY FROM CHEMO   Tobacco abuse     PAST SURGICAL HISTORY:  Past Surgical History:  Procedure Laterality Date   CERVICAL DISCECTOMY     CERVICAL FUSION     COLON SURGERY  NOV 2012   COLON REMOVED   COLON SURGERY  APRIL 2014   OSTOMY REMOVED   COLONOSCOPY N/A 04/21/2015   Procedure: COLONOSCOPY;  Surgeon: Stephens Eis, MD;  Location: [facility];  Service: Gastroenterology;  Laterality: N/A;  DIABETIC, DIET CONTROLLED   COLONOSCOPY WITH PROPOFOL  N/A 11/07/2018   Procedure: COLONOSCOPY WITH PROPOFOL ;  Surgeon: Marshall Skeeter, MD;  Location: [facility];  Service: Endoscopy;  Laterality: N/A;   COLOSTOMY REVISION     hx of colostomy     LAMINECTOMY     POLYPECTOMY N/A 04/21/2015   Procedure: POLYPECTOMY;  Surgeon: Stephens Eis, MD;  Location: [facility];  Service: Gastroenterology;  Laterality: N/A;   TUNNELED VENOUS PORT PLACEMENT      HEMATOLOGY/ONCOLOGY HISTORY:  Oncology History   No history exists.    ALLERGIES:  has no known allergies.  MEDICATIONS:  Current Facility-Administered Medications  Medication Dose Route Frequency Provider Last Rate Last Admin   acetaminophen  (TYLENOL ) tablet 650 mg  650 mg Oral Q6H PRN Niu, Xilin, MD   650 mg at 05/03/23 2353   albuterol  (PROVENTIL ) (2.5 MG/3ML) 0.083% nebulizer solution 2.5 mg  2.5 mg Inhalation Q4H PRN Niu, Xilin, MD  aspirin  EC tablet 81 mg  81 mg Oral Daily Niu, Xilin, MD   81 mg at 05/04/23 1256   bisacodyl  (DULCOLAX) suppository 10 mg  10 mg Rectal Daily PRN Niu, Xilin, MD       cefTRIAXone  (ROCEPHIN ) 1 g in sodium chloride  0.9 % 100 mL IVPB  1 g Intravenous Q24H Niu, Xilin, MD   Stopped at 05/04/23 0350   dextromethorphan-guaiFENesin  (MUCINEX  DM) 30-600 MG per 12 hr tablet 1 tablet  1 tablet Oral BID PRN Niu, Xilin, MD       enoxaparin  (LOVENOX ) injection 40 mg  40 mg Subcutaneous Q24H Niu, Xilin, MD   40 mg at 05/03/23 2116   feeding supplement (ENSURE  ENLIVE / ENSURE PLUS) liquid 237 mL  237 mL Oral BID BM Niu, Xilin, MD   237 mL at 05/04/23 1516   [START ON 05/06/2023] fentaNYL  (DURAGESIC ) 12 MCG/HR 1 patch  1 patch Transdermal Q72H Wouk, Haynes Lips, MD       hydrALAZINE  (APRESOLINE ) injection 5 mg  5 mg Intravenous Q2H PRN Niu, Xilin, MD       lidocaine  (LIDODERM ) 5 % 1 patch  1 patch Transdermal Q24H Niu, Xilin, MD   1 patch at 05/04/23 1257   metoprolol  tartrate (LOPRESSOR ) tablet 25 mg  25 mg Oral BID Niu, Xilin, MD   25 mg at 05/04/23 1256   morphine  (PF) 2 MG/ML injection 2 mg  2 mg Intravenous Q4H PRN Niu, Xilin, MD   2 mg at 05/04/23 1243   nicotine  (NICODERM CQ  - dosed in mg/24 hours) patch 21 mg  21 mg Transdermal Daily Niu, Xilin, MD   21 mg at 05/04/23 1257   ondansetron  (ZOFRAN ) injection 4 mg  4 mg Intravenous Q8H PRN Niu, Xilin, MD       oxyCODONE  (Oxy IR/ROXICODONE ) immediate release tablet 5 mg  5 mg Oral Q6H PRN Niu, Xilin, MD       pantoprazole  (PROTONIX ) EC tablet 40 mg  40 mg Oral Daily Niu, Xilin, MD   40 mg at 05/04/23 1256   polyethylene glycol (MIRALAX  / GLYCOLAX ) packet 17 g  17 g Oral Daily Niu, Xilin, MD   17 g at 05/04/23 1346   traZODone  (DESYREL ) tablet 25 mg  25 mg Oral QHS PRN Niu, Xilin, MD       umeclidinium-vilanterol (ANORO ELLIPTA ) 62.5-25 MCG/ACT 1 puff  1 puff Inhalation Daily Niu, Xilin, MD   1 puff at 05/04/23 1553   Facility-Administered Medications Ordered in Other Encounters  Medication Dose Route Frequency Provider Last Rate Last Admin   sodium chloride  flush (NS) 0.9 % injection 10 mL  10 mL Intravenous PRN Finnegan, Timothy J, MD   10 mL at 02/16/18 0949    VITAL SIGNS: BP 118/80 (BP Location: Left Arm)   Pulse (!) 102   Temp 98 F (36.7 C)   Resp 18   Ht 6' (1.829 m)   Wt 163 lb 2.3 oz (74 kg)   SpO2 94%   BMI 22.13 kg/m  Filed Weights   05/03/23 1403  Weight: 163 lb 2.3 oz (74 kg)    Estimated body mass index is 22.13 kg/m as calculated from the following:   Height as of this  encounter: 6' (1.829 m).   Weight as of this encounter: 163 lb 2.3 oz (74 kg).  LABS: CBC:    Component Value Date/Time   WBC 19.7 (H) 05/04/2023 0514   HGB 11.0 (L) 05/04/2023 0514   HGB 17.6  04/23/2014 0823   HCT 34.6 (L) 05/04/2023 0514   HCT 54.1 (H) 04/23/2014 0823   PLT 180 05/04/2023 0514   PLT 160 04/23/2014 0823   MCV 82.2 05/04/2023 0514   MCV 88 04/23/2014 0823   NEUTROABS 17.5 (H) 05/03/2023 1413   NEUTROABS 2.0 04/23/2014 0823   LYMPHSABS 0.5 (L) 05/03/2023 1413   LYMPHSABS 1.5 04/23/2014 0823   MONOABS 1.4 (H) 05/03/2023 1413   MONOABS 0.4 04/23/2014 0823   EOSABS 0.0 05/03/2023 1413   EOSABS 0.1 04/23/2014 0823   BASOSABS 0.1 05/03/2023 1413   BASOSABS 0.0 04/23/2014 0823   Comprehensive Metabolic Panel:    Component Value Date/Time   NA 146 (H) 05/04/2023 0514   NA 138 04/23/2014 0823   K 3.8 05/04/2023 0514   K 4.7 04/23/2014 0823   CL 113 (H) 05/04/2023 0514   CL 99 (L) 04/23/2014 0823   CO2 22 05/04/2023 0514   CO2 31 04/23/2014 0823   BUN 52 (H) 05/04/2023 0514   BUN 22 (H) 04/23/2014 0823   CREATININE 1.23 05/04/2023 0514   CREATININE 1.39 (H) 04/23/2014 0823   GLUCOSE 106 (H) 05/04/2023 0514   GLUCOSE 117 (H) 04/23/2014 0823   CALCIUM 8.5 (L) 05/04/2023 0514   CALCIUM 9.3 04/23/2014 0823   AST 25 05/03/2023 1413   AST 20 04/23/2014 0823   ALT 37 05/03/2023 1413   ALT 19 04/23/2014 0823   ALKPHOS 132 (H) 05/03/2023 1413   ALKPHOS 101 04/23/2014 0823   BILITOT 1.0 05/03/2023 1413   BILITOT 0.4 04/23/2014 0823   PROT 6.8 05/03/2023 1413   PROT 7.4 04/23/2014 0823   ALBUMIN 2.1 (L) 05/03/2023 1413   ALBUMIN 4.1 04/23/2014 0823    RADIOGRAPHIC STUDIES: CT ABDOMEN PELVIS W CONTRAST Result Date: 05/03/2023 CLINICAL DATA:  Abdominal pain.  Metastatic right lung mass. EXAM: CT ABDOMEN AND PELVIS WITH CONTRAST TECHNIQUE: Multidetector CT imaging of the abdomen and pelvis was performed using the standard protocol following bolus administration  of intravenous contrast. RADIATION DOSE REDUCTION: This exam was performed according to the departmental dose-optimization program which includes automated exposure control, adjustment of the mA and/or kV according to patient size and/or use of iterative reconstruction technique. CONTRAST:  OMNIPAQUE  IOHEXOL  350 MG/ML SOLN COMPARISON:  CT abdomen pelvis dated 04/01/2023. FINDINGS: Lower chest: Interval progression of masslike thickening and consolidative changes of the right lung base and pleural surface in keeping with known malignancy. Increase in the size of subcarinal adenopathy measuring approximately 2.1 cm in short axis. No intra-abdominal free air.  Trace subhepatic fluid. Hepatobiliary: Subcentimeter left hepatic hypodense lesion is too small to characterize. There is otherwise unremarkable. No dilatation. No calcified gallstone. Pancreas: The pancreas is unremarkable as visualized. Spleen: Normal in size without focal abnormality. Adrenals/Urinary Tract: Increase in the size of right adrenal nodule measuring 16 mm on today's exam most concerning for metastatic disease. Bilateral renal cysts as seen previously. There is no hydronephrosis on either side. There is symmetric enhancement and excretion of contrast by both kidneys. There is diffuse trabeculation of the bladder wall suggestive of chronic bladder outlet obstruction. Stomach/Bowel: Right inguinal hernia containing several loops of small bowel. Postsurgical changes of sigmoid colon with anastomotic staple line. There is no bowel obstruction. The appendix is not identified, likely surgically absent. Vascular/Lymphatic: Moderate aortoiliac atherosclerotic disease. There is a 2.3 cm infrarenal aortic ectasia. The IVC is unremarkable. No portal venous gas. There is no adenopathy. Reproductive: Enlarged prostate gland measuring 6 cm in transverse axial  diameter. Other: None Musculoskeletal: Osteopenia with degenerative changes of the spine and  scoliosis. Increase in the size of the left gluteal mass since the prior CT measuring approximately 5.5 x 8.0 cm in greatest axial dimensions (previously 5.5 x 4.5 cm). Progression of predominantly lytic lesion in the proximal right femur with cortical breakage measuring approximately 3.3 x 4.2 cm in greatest axial dimension. IMPRESSION: 1. Progression of pulmonary and pleural malignancy in the visualized right lung base as well as progression of mediastinal adenopathy. 2. Increase in the size of right adrenal nodule as well as increase in the size of the left gluteal mass and lesion in the proximal right femur. 3. Right inguinal hernia containing several loops of small bowel. No bowel obstruction. 4. Enlarged prostate gland with findings of chronic bladder outlet obstruction. 5.  Aortic Atherosclerosis (ICD10-I70.0). Electronically Signed   By: Angus Bark M.D.   On: 05/03/2023 16:58   CT Angio Chest PE W and/or Wo Contrast Result Date: 05/03/2023 CLINICAL DATA:  Pulmonary embolism suspected EXAM: CT ANGIOGRAPHY CHEST WITH CONTRAST TECHNIQUE: Multidetector CT imaging of the chest was performed using the standard protocol during bolus administration of intravenous contrast. Multiplanar CT image reconstructions and MIPs were obtained to evaluate the vascular anatomy. RADIATION DOSE REDUCTION: This exam was performed according to the departmental dose-optimization program which includes automated exposure control, adjustment of the mA and/or kV according to patient size and/or use of iterative reconstruction technique. CONTRAST:  OMNIPAQUE  IOHEXOL  350 MG/ML SOLN COMPARISON:  March 21, 2023 FINDINGS: Cardiovascular: Satisfactory opacification of the pulmonary arteries to the segmental level. No evidence of pulmonary embolism. Normal heart size. No pericardial effusion. Coronary artery calcifications. Mediastinum/Nodes: Aortic pulmonic window adenopathy measuring 2 by 1.1 cm, and 1.7 x 2 cm. Lungs/Pleura:  Comparison with prior examination there is interval increase in the size of the previously described right upper lobe mass inseparable from the adjacent mediastinum measuring 6.6 x 6.4 cm AP and transverse diameter several fundal right hilum mediastinum. With significant elevation of the right hemidiaphragm and volume loss of the right lower lobe and right middle lobe. Small right pleural reaction without significant right pleural effusion. Significant pleural thickening has worsened since prior examination. The left lung demonstrates a chronic interstitial lung disease and centrilobular emphysema. Upper Abdomen: An ill-defined bilobed mass in the right lateral chest wall intercostal space along the anterior and mid axillary lines. This mass measures at least 6.2 x 3 cm in maximum AP and transverse diameter and is producing some degree of compression and in indentation of the right lobe of the liver. It does appear larger than on prior examination. May correlate with metastatic disease. Musculoskeletal: No obvious fractures.  No other lytic bone lesions. Review of the MIP images confirms the above findings. IMPRESSION: Above described findings indicate worsening right upper lobe mass. Which is highly suggestive of a malignant neoplastic process such as bronchogenic carcinoma. Inseparable from the adjacent mediastinum and hilum. With postobstructive pneumonia pneumonitis and bronchiectasis and significant pleural thickening. Interval increase in size of an mass in the right lateral costophrenic sulcus which extends in the subhepatic perihepatic region as described producing indentation and deformity of the right lobe of the liver which suggests metastatic disease probably within the pleural reflection. There is also some nodularity with at the level of the right anterior cardiophrenic angle measuring about 2.5 x 3.6 cm which is similar to prior examination and may correlate with diaphragmatic cardiophrenic  adenopathy. No evidence of pulmonary embolism. Electronically Signed  By: Fredrich Jefferson M.D.   On: 05/03/2023 16:50   CT HEAD WO CONTRAST ( ) Result Date: 05/03/2023 CLINICAL DATA:  Trauma EXAM: CT HEAD WITHOUT CONTRAST TECHNIQUE: Contiguous axial images were obtained from the base of the skull through the vertex without intravenous contrast. RADIATION DOSE REDUCTION: This exam was performed according to the departmental dose-optimization program which includes automated exposure control, adjustment of the mA and/or kV according to patient size and/or use of iterative reconstruction technique. COMPARISON:  None Available. FINDINGS: Brain: No evidence of acute infarction, hemorrhage, hydrocephalus, extra-axial collection or mass lesion/mass effect. There is mild diffuse atrophy. Vascular: Atherosclerotic calcifications are present within the cavernous internal carotid arteries. Skull: Normal. Negative for fracture or focal lesion. Sinuses/Orbits: No acute finding. Other: None. IMPRESSION: 1. No acute intracranial process. 2. Mild diffuse atrophy. Electronically Signed   By: Tyron Gallon M.D.   On: 05/03/2023 16:47   DG Chest Portable 1 View Result Date: 05/03/2023 CLINICAL DATA:  Weakness, lung cancer. EXAM: PORTABLE CHEST 1 VIEW COMPARISON:  May 05, 2012.  April 19, 2023. FINDINGS: Stable cardiomegaly. Elevated right hemidiaphragm is noted. Right upper lobe mass is noted consistent with malignancy noted on PET scan. Diffuse pleural thickening is noted with minimal right basilar subsegmental atelectasis or scarring. Minimal left basilar subsegmental atelectasis is noted. Status post surgical posterior fusion of cervical and upper thoracic spine. IMPRESSION: Right upper lobe mass is noted consistent with malignancy as noted on prior PET scan. Diffuse right-sided pleural thickening is noted with minimal right basilar subsegmental atelectasis or scarring. Minimal left basilar subsegmental atelectasis is  noted. Electronically Signed   By: Rosalene Colon M.D.   On: 05/03/2023 15:14   NM PET Image Initial (PI) Skull Base To Thigh Result Date: 04/19/2023 CLINICAL DATA:  Initial treatment strategy for right lung mass. History of colon cancer. Additional findings concerning for metastatic disease on CT. EXAM: NUCLEAR MEDICINE PET SKULL BASE TO THIGH TECHNIQUE: 9.28 mCi F-18 FDG was injected intravenously. Full-ring PET imaging was performed from the skull base to thigh after the radiotracer. CT data was obtained and used for attenuation correction and anatomic localization. Fasting blood glucose: 119 mg/dl COMPARISON:  Chest CT 16/10/9602. Abdominopelvic CT 04/01/2023. PET-CT 01/15/2013. FINDINGS: Mediastinal blood pool activity: SUV max 2.4 NECK: No hypermetabolic cervical lymph nodes are identified. No suspicious activity identified within the pharyngeal mucosal space. Asymmetric muscular activity in the left neck, within physiologic limits. Incidental CT findings: Bilateral carotid atherosclerosis. CHEST: There are hypermetabolic mediastinal lymph nodes, including AP window lymph nodes measuring up to 8 mm on image 44/6 (SUV max 11.8) and a 1.5 cm subcarinal node on image 51/6 (SUV max 11.7). As seen on recent CTs, there is irregular pleural thickening on the right with associated hypermetabolic activity (SUV max 7.6 medially and 14.8 laterally near the costophrenic angle), consistent with pleural metastatic disease. Resulting partial right lung collapse. Ill-defined right upper lobe mass is partially obscured by adjacent parenchymal opacity, but demonstrates focal hypermetabolic activity (SUV max 19.1). No hypermetabolic pulmonary activity or highly suspicious nodularity in the left lung. 8 mm left lower lobe nodule on image 49/6 is unchanged from recent CT, without hypermetabolic activity. Incidental CT findings: Underlying mild to moderate centrilobular and paraseptal emphysema. Diffuse atherosclerosis of the  aorta, great vessels and coronary arteries. ABDOMEN/PELVIS: Hypermetabolic right adrenal mass measuring 1.8 x 1.4 cm on image 79/6 (SUV max 11.0). There is no hypermetabolic activity within the liver, pancreas, spleen or left adrenal gland. There is no  hypermetabolic nodal activity in the abdomen or pelvis. Bowel activity within physiologic limits. No omental or peritoneal hypermetabolic activity identified. There is prostate gland is moderately enlarged with nonspecific focal hypermetabolic activity peripherally on the left (SUV max 8.1). Incidental CT findings: Unchanged cyst posteriorly in the left hepatic lobe (image 72/6) without hypermetabolic activity. There are cystic renal lesions bilaterally without hypermetabolic activity for which no specific follow-up imaging is recommended. Unchanged right inguinal hernia containing small bowel. There is moderate stool throughout the colon status post sigmoid anastomosis. Diffuse aortic and branch vessel atherosclerosis. SKELETON: There are multiple hypermetabolic osseous metastases. A large expansile lesion involving the left iliac wing and adjacent iliacus and gluteus musculature demonstrates peripheral hypermetabolic activity (SUV max 10.1) and central necrosis. Lytic lesion involving the anterior intertrochanteric region of the right femur demonstrates an SUV max of 13.1. There are other lesions within the right iliac bone, left sacral ala and midthoracic spine. Incidental CT findings: Multilevel spondylosis. Previous multilevel cervicothoracic fusion. Advanced glenohumeral arthropathy, right greater than left. IMPRESSION: 1. Widespread metastatic disease as described with involvement of the right lung, right pleural space, mediastinal lymph nodes, right adrenal gland and multiple bones. 2. Hypermetabolic right upper lobe mass could reflect primary bronchogenic carcinoma or metastatic disease from the patient's colon cancer. 3. Nonspecific focal hypermetabolic  activity peripherally in the left prostate gland. Correlate with PSA levels. 4. Aortic Atherosclerosis (ICD10-I70.0) and Emphysema (ICD10-J43.9). Electronically Signed   By: Elmon Hagedorn M.D.   On: 04/19/2023 11:20   DG Abd 1 View Result Date: 04/07/2023 CLINICAL DATA:  Vomiting, small-bowel obstruction EXAM: ABDOMEN - 1 VIEW COMPARISON:  CT 04/01/2023 FINDINGS: The stomach remains mildly dilated. Multiple gas-filled mid abdominal bowel loops without overt dilatation. Right perihilar and basilar pulmonary opacities as before. Multilevel thoracolumbar spondylitic change. Aortoiliac calcified plaque. IMPRESSION: Persistent  gastric dilatation. Electronically Signed   By: Nicoletta Barrier M.D.   On: 04/07/2023 10:38    PERFORMANCE STATUS (ECOG) : 2 - Symptomatic, <50% confined to bed  Review of Systems Unless otherwise noted, a complete review of systems is negative.  Physical Exam General: NAD Cardiovascular: regular rate and rhythm Pulmonary: clear ant fields Abdomen: soft, nontender, + bowel sounds GU: no suprapubic tenderness Extremities: no edema, no joint deformities Skin: no rashes Neurological: Weakness but otherwise nonfocal  IMPRESSION: Patient had PET scan on 04/19/23 revealing widespread metastatic disease involving the right lung, right pleural space, mediastinal lymph nodes, right adrenal gland, and osseous metastasis.  Patient readmitted 05/03/2022 with failure to thrive.  Repeat CT of abdomen and pelvis shows progressive metastatic disease.  I met with patient, son, sister, and brother-in-law.  Patient states that he has done poorly since discharging to rehab.  He is now mostly in the bed and requires a lift to transfer.  He states that he feels his quality of life has become significantly poor and he has little hope for improvement.  Patient had previously communicated reluctance to proceed with cancer treatment and reiterates this today.  He is no longer interested in pursuing  biopsy.  We discussed the option of hospice involvement instead with a focus on his comfort and quality of life.  Patient was previously living at home alone.  Patient does not wish to return home even with hospice involvement.  Sister questions if he would be a candidate for hospice IPU.  Will have hospice evaluate.  PLAN: - Best supportive care -Referral to hospice  Case and plan discussed with Dr. Adrian Howe  Patient expressed  understanding and was in agreement with this plan. He also understands that He can call the clinic at any time with any questions, concerns, or complaints.     Time Total: 70 minutes  Visit consisted of counseling and education dealing with the complex and emotionally intense issues of symptom management and palliative care in the setting of serious and potentially life-threatening illness.Greater than 50%  of this time was spent counseling and coordinating care related to the above assessment and plan.  Signed by: Gerilyn Kobus, PhD, NP-C

## 2023-05-04 NOTE — Progress Notes (Addendum)
 PROGRESS NOTE    PAO HAFFEY  ZOX:[id number] DOB: 20-Jan-1945 DOA: 05/03/2023 PCP: Antonio Baumgarten, MD  Outpatient Specialists: oncology    Brief Narrative:   From admission h and p  Cameron Howe is a 78 y.o. male with medical history significant of hypertension, hyperlipidemia, COPD, peripheral neuropathy, chronic back pain with sciatic pain, colon cancer (s/p of partial colectomy, chemo and radiation therapy), recently found lung mass with widespread metastatic disease, who presents with burning on urination, generalized weakness, failure to thrive, bilateral hip pain.   Patient states that he has been feeling very weak recently, even cannot sit up without assistance.  He has poor appetite with decreased oral intake, failure to thrive.  He has burning on urination, no dysuria or hematuria.  He has a history of chronic back pain with sciatica pain, reporting that he has worsening pain in both hips, right hip is worse than the left.  The pain is severe, constant, sharp, nonradiating.  Patient does not have chest pain, cough, SOB.  No nausea, vomiting, diarrhea or abdominal pain.  No symptoms UTI.  Assessment & Plan:   Principal Problem:   UTI (urinary tract infection) Active Problems:   Mass of right lung   Failure to thrive in adult   Myocardial injury   AKI (acute kidney injury) (HCC)   Essential hypertension   Dyslipidemia   COPD (chronic obstructive pulmonary disease) (HCC)   Chronic low back pain with sciatica   Current tobacco use   Type 2 diabetes mellitus (HCC)   Malignant neoplasm of sigmoid colon (HCC)  # Failure to thrive # Metastatic disease Biopsy pending, patient hasn't yet decided whether he wants to treat, meanwhile disease appears to be advancing and his health is failing. CT shows right upper lobe mass suggestive of malignancy such as bronchogenic carcinoma, with mass lateral costophrenic sulcus extending to the subhepatic region, also possible diaphragmatic  adenopathy, adrenal nodule, gluteal mass. - will ask oncology to see today Adrian Alba) - onc palliative ([facility]) also to see  # UTI  Complains of dysuria. No urologic complications seen on CT. Ua consistent w/ infection - f/u urine culture, blood cultures - continue ceftriaxone   # Debility - PT consult  # HTN BPs appropriate - home metop  # COPD Quiescent - albuterol  prn - home anoro  # Sciatica # Chronic cancer pain - home fentanyl , oxy prn - pain control - bowel regimen  # T2DM Diet controlled - monitor sugars   DVT prophylaxis: lovenox  Code Status: dnr Family Communication: none at bedside. No answer when either contact telephoned today.  Level of care: Telemetry Medical Status is: Observation    Consultants:  Oncology, palliative  Procedures: none  Antimicrobials:  ceftriaxone     Subjective: Reports stable back pain, ongoing dysuria  Objective: Vitals:   05/04/23 0430 05/04/23 0530 05/04/23 0714 05/04/23 0717  BP: 129/61 116/72  118/65  Pulse:    97  Resp:  20  20  Temp:   (!) 97.4 F (36.3 C)   TempSrc:   Oral   SpO2: 100%   100%  Weight:      Height:        Intake/Output Summary (Last 24 hours) at 05/04/2023 0847 Last data filed at 05/03/2023 1841 Gross per 24 hour  Intake 1200 ml  Output 400 ml  Net 800 ml   Filed Weights   05/03/23 1403  Weight: 74 kg    Examination:  General exam: Appears calm and comfortable, cachectic Respiratory  system: Clear to auscultation save for rales right upper Cardiovascular system: S1 & S2 heard, RRR.   Gastrointestinal system: Abdomen is nondistended, soft and nontender. No organomegaly or masses felt.   Central nervous system: Alert and oriented to person and place. No focal neurological deficits. Extremities: Symmetric 5 x 5 power. Skin: No rashes, lesions or ulcers Psychiatry: calm, somewhat confused    Data Reviewed: I have personally reviewed following labs and imaging  studies  CBC: Recent Labs  Lab 05/03/23 1413 05/04/23 0514  WBC 19.6* 19.7*  NEUTROABS 17.5*  --   HGB 12.4* 11.0*  HCT 38.9* 34.6*  MCV 81.4 82.2  PLT 205 180   Basic Metabolic Panel: Recent Labs  Lab 05/03/23 1413 05/04/23 0514  NA 143 146*  K 4.2 3.8  CL 110 113*  CO2 23 22  GLUCOSE 145* 106*  BUN 56* 52*  CREATININE 1.33* 1.23  CALCIUM 8.8* 8.5*   GFR: Estimated Creatinine Clearance: 52.6 mL/min (by C-G formula based on SCr of 1.23 mg/dL). Liver Function Tests: Recent Labs  Lab 05/03/23 1413  AST 25  ALT 37  ALKPHOS 132*  BILITOT 1.0  PROT 6.8  ALBUMIN 2.1*   No results for input(s): "LIPASE", "AMYLASE" in the last 168 hours. No results for input(s): "AMMONIA" in the last 168 hours. Coagulation Profile: Recent Labs  Lab 05/03/23 1540  INR 1.1   Cardiac Enzymes: No results for input(s): "CKTOTAL", "CKMB", "CKMBINDEX", "TROPONINI" in the last 168 hours. BNP (last 3 results) No results for input(s): "PROBNP" in the last 8760 hours. HbA1C: No results for input(s): "HGBA1C" in the last 72 hours. CBG: No results for input(s): "GLUCAP" in the last 168 hours. Lipid Profile: No results for input(s): "CHOL", "HDL", "LDLCALC", "TRIG", "CHOLHDL", "LDLDIRECT" in the last 72 hours. Thyroid Function Tests: No results for input(s): "TSH", "T4TOTAL", "FREET4", "T3FREE", "THYROIDAB" in the last 72 hours. Anemia Panel: No results for input(s): "VITAMINB12", "FOLATE", "FERRITIN", "TIBC", "IRON", "RETICCTPCT" in the last 72 hours. Urine analysis:    Component Value Date/Time   COLORURINE YELLOW (A) 05/03/2023 1540   APPEARANCEUR CLOUDY (A) 05/03/2023 1540   LABSPEC 1.035 (H) 05/03/2023 1540   PHURINE 7.0 05/03/2023 1540   GLUCOSEU NEGATIVE 05/03/2023 1540   HGBUR SMALL (A) 05/03/2023 1540   BILIRUBINUR NEGATIVE 05/03/2023 1540   KETONESUR NEGATIVE 05/03/2023 1540   PROTEINUR 30 (A) 05/03/2023 1540   NITRITE NEGATIVE 05/03/2023 1540   LEUKOCYTESUR LARGE (A)  05/03/2023 1540   Sepsis Labs: @LABRCNTIP (procalcitonin:4,lacticidven:4)  ) Recent Results (from the past 240 hours)  Blood Culture (routine x 2)     Status: None (Preliminary result)   Collection Time: 05/03/23  3:23 PM   Specimen: BLOOD  Result Value Ref Range Status   Specimen Description BLOOD LEFT ANTECUBITAL  Final   Special Requests   Final    BOTTLES DRAWN AEROBIC AND ANAEROBIC Blood Culture results may not be optimal due to an inadequate volume of blood received in culture bottles   Culture   Final    NO GROWTH < 24 HOURS Performed at [facility], [address], [place], [place] [address]    Report Status PENDING  Incomplete  Resp panel by RT-PCR (RSV, Flu A&B, Covid) Anterior Nasal Swab     Status: None   Collection Time: 05/03/23  3:40 PM   Specimen: Anterior Nasal Swab  Result Value Ref Range Status   SARS Coronavirus 2 by RT PCR NEGATIVE NEGATIVE Final    Comment: (NOTE) SARS-CoV-2 target  nucleic acids are NOT DETECTED.  The SARS-CoV-2 RNA is generally detectable in upper respiratory specimens during the acute phase of infection. The lowest concentration of SARS-CoV-2 viral copies this assay can detect is 138 copies/mL. A negative result does not preclude SARS-Cov-2 infection and should not be used as the sole basis for treatment or other patient management decisions. A negative result may occur with  improper specimen collection/handling, submission of specimen other than nasopharyngeal swab, presence of viral mutation(s) within the areas targeted by this assay, and inadequate number of viral copies(<138 copies/mL). A negative result must be combined with clinical observations, patient history, and epidemiological information. The expected result is Negative.  Fact Sheet for Patients:  BloggerCourse.com  Fact Sheet for Healthcare Providers:  SeriousBroker.it  This test is no t yet approved or  cleared by the [place]  FDA and  has been authorized for detection and/or diagnosis of SARS-CoV-2 by FDA under an Emergency Use Authorization (EUA). This EUA will remain  in effect (meaning this test can be used) for the duration of the COVID-19 declaration under Section 564(b)(1) of the Act, 21 U.S.C.section 360bbb-3(b)(1), unless the authorization is terminated  or revoked sooner.       Influenza A by PCR NEGATIVE NEGATIVE Final   Influenza B by PCR NEGATIVE NEGATIVE Final    Comment: (NOTE) The Xpert Xpress SARS-CoV-2/FLU/RSV plus assay is intended as an aid in the diagnosis of influenza from Nasopharyngeal swab specimens and should not be used as a sole basis for treatment. Nasal washings and aspirates are unacceptable for Xpert Xpress SARS-CoV-2/FLU/RSV testing.  Fact Sheet for Patients: BloggerCourse.com  Fact Sheet for Healthcare Providers: SeriousBroker.it  This test is not yet approved or cleared by the [place]  FDA and has been authorized for detection and/or diagnosis of SARS-CoV-2 by FDA under an Emergency Use Authorization (EUA). This EUA will remain in effect (meaning this test can be used) for the duration of the COVID-19 declaration under Section 564(b)(1) of the Act, 21 U.S.C. section 360bbb-3(b)(1), unless the authorization is terminated or revoked.     Resp Syncytial Virus by PCR NEGATIVE NEGATIVE Final    Comment: (NOTE) Fact Sheet for Patients: BloggerCourse.com  Fact Sheet for Healthcare Providers: SeriousBroker.it  This test is not yet approved or cleared by the [place]  FDA and has been authorized for detection and/or diagnosis of SARS-CoV-2 by FDA under an Emergency Use Authorization (EUA). This EUA will remain in effect (meaning this test can be used) for the duration of the COVID-19 declaration under Section 564(b)(1) of the Act, 21  U.S.C. section 360bbb-3(b)(1), unless the authorization is terminated or revoked.  Performed at [facility], [address] [place], [place] [address]   Blood Culture (routine x 2)     Status: None (Preliminary result)   Collection Time: 05/03/23  3:40 PM   Specimen: BLOOD  Result Value Ref Range Status   Specimen Description BLOOD BLOOD RIGHT FOREARM  Final   Special Requests   Final    BOTTLES DRAWN AEROBIC AND ANAEROBIC Blood Culture results may not be optimal due to an inadequate volume of blood received in culture bottles   Culture   Final    NO GROWTH < 24 HOURS Performed at [facility], [address] [place], [place] [address]    Report Status PENDING  Incomplete         Radiology Studies: CT ABDOMEN PELVIS W CONTRAST Result Date: 05/03/2023 CLINICAL DATA:  Abdominal pain.  Metastatic right lung  mass. EXAM: CT ABDOMEN AND PELVIS WITH CONTRAST TECHNIQUE: Multidetector CT imaging of the abdomen and pelvis was performed using the standard protocol following bolus administration of intravenous contrast. RADIATION DOSE REDUCTION: This exam was performed according to the departmental dose-optimization program which includes automated exposure control, adjustment of the mA and/or kV according to patient size and/or use of iterative reconstruction technique. CONTRAST:  OMNIPAQUE  IOHEXOL  350 MG/ML SOLN COMPARISON:  CT abdomen pelvis dated 04/01/2023. FINDINGS: Lower chest: Interval progression of masslike thickening and consolidative changes of the right lung base and pleural surface in keeping with known malignancy. Increase in the size of subcarinal adenopathy measuring approximately 2.1 cm in short axis. No intra-abdominal free air.  Trace subhepatic fluid. Hepatobiliary: Subcentimeter left hepatic hypodense lesion is too small to characterize. There is otherwise unremarkable. No dilatation. No calcified gallstone. Pancreas: The pancreas is unremarkable as  visualized. Spleen: Normal in size without focal abnormality. Adrenals/Urinary Tract: Increase in the size of right adrenal nodule measuring 16 mm on today's exam most concerning for metastatic disease. Bilateral renal cysts as seen previously. There is no hydronephrosis on either side. There is symmetric enhancement and excretion of contrast by both kidneys. There is diffuse trabeculation of the bladder wall suggestive of chronic bladder outlet obstruction. Stomach/Bowel: Right inguinal hernia containing several loops of small bowel. Postsurgical changes of sigmoid colon with anastomotic staple line. There is no bowel obstruction. The appendix is not identified, likely surgically absent. Vascular/Lymphatic: Moderate aortoiliac atherosclerotic disease. There is a 2.3 cm infrarenal aortic ectasia. The IVC is unremarkable. No portal venous gas. There is no adenopathy. Reproductive: Enlarged prostate gland measuring 6 cm in transverse axial diameter. Other: None Musculoskeletal: Osteopenia with degenerative changes of the spine and scoliosis. Increase in the size of the left gluteal mass since the prior CT measuring approximately 5.5 x 8.0 cm in greatest axial dimensions (previously 5.5 x 4.5 cm). Progression of predominantly lytic lesion in the proximal right femur with cortical breakage measuring approximately 3.3 x 4.2 cm in greatest axial dimension. IMPRESSION: 1. Progression of pulmonary and pleural malignancy in the visualized right lung base as well as progression of mediastinal adenopathy. 2. Increase in the size of right adrenal nodule as well as increase in the size of the left gluteal mass and lesion in the proximal right femur. 3. Right inguinal hernia containing several loops of small bowel. No bowel obstruction. 4. Enlarged prostate gland with findings of chronic bladder outlet obstruction. 5.  Aortic Atherosclerosis (ICD10-I70.0). Electronically Signed   By: Angus Bark M.D.   On: 05/03/2023 16:58    CT Angio Chest PE W and/or Wo Contrast Result Date: 05/03/2023 CLINICAL DATA:  Pulmonary embolism suspected EXAM: CT ANGIOGRAPHY CHEST WITH CONTRAST TECHNIQUE: Multidetector CT imaging of the chest was performed using the standard protocol during bolus administration of intravenous contrast. Multiplanar CT image reconstructions and MIPs were obtained to evaluate the vascular anatomy. RADIATION DOSE REDUCTION: This exam was performed according to the departmental dose-optimization program which includes automated exposure control, adjustment of the mA and/or kV according to patient size and/or use of iterative reconstruction technique. CONTRAST:  OMNIPAQUE  IOHEXOL  350 MG/ML SOLN COMPARISON:  March 21, 2023 FINDINGS: Cardiovascular: Satisfactory opacification of the pulmonary arteries to the segmental level. No evidence of pulmonary embolism. Normal heart size. No pericardial effusion. Coronary artery calcifications. Mediastinum/Nodes: Aortic pulmonic window adenopathy measuring 2 by 1.1 cm, and 1.7 x 2 cm. Lungs/Pleura: Comparison with prior examination there is interval increase in the size of  the previously described right upper lobe mass inseparable from the adjacent mediastinum measuring 6.6 x 6.4 cm AP and transverse diameter several fundal right hilum mediastinum. With significant elevation of the right hemidiaphragm and volume loss of the right lower lobe and right middle lobe. Small right pleural reaction without significant right pleural effusion. Significant pleural thickening has worsened since prior examination. The left lung demonstrates a chronic interstitial lung disease and centrilobular emphysema. Upper Abdomen: An ill-defined bilobed mass in the right lateral chest wall intercostal space along the anterior and mid axillary lines. This mass measures at least 6.2 x 3 cm in maximum AP and transverse diameter and is producing some degree of compression and in indentation of the right lobe of  the liver. It does appear larger than on prior examination. May correlate with metastatic disease. Musculoskeletal: No obvious fractures.  No other lytic bone lesions. Review of the MIP images confirms the above findings. IMPRESSION: Above described findings indicate worsening right upper lobe mass. Which is highly suggestive of a malignant neoplastic process such as bronchogenic carcinoma. Inseparable from the adjacent mediastinum and hilum. With postobstructive pneumonia pneumonitis and bronchiectasis and significant pleural thickening. Interval increase in size of an mass in the right lateral costophrenic sulcus which extends in the subhepatic perihepatic region as described producing indentation and deformity of the right lobe of the liver which suggests metastatic disease probably within the pleural reflection. There is also some nodularity with at the level of the right anterior cardiophrenic angle measuring about 2.5 x 3.6 cm which is similar to prior examination and may correlate with diaphragmatic cardiophrenic adenopathy. No evidence of pulmonary embolism. Electronically Signed   By: Fredrich Jefferson M.D.   On: 05/03/2023 16:50   CT HEAD WO CONTRAST ( ) Result Date: 05/03/2023 CLINICAL DATA:  Trauma EXAM: CT HEAD WITHOUT CONTRAST TECHNIQUE: Contiguous axial images were obtained from the base of the skull through the vertex without intravenous contrast. RADIATION DOSE REDUCTION: This exam was performed according to the departmental dose-optimization program which includes automated exposure control, adjustment of the mA and/or kV according to patient size and/or use of iterative reconstruction technique. COMPARISON:  None Available. FINDINGS: Brain: No evidence of acute infarction, hemorrhage, hydrocephalus, extra-axial collection or mass lesion/mass effect. There is mild diffuse atrophy. Vascular: Atherosclerotic calcifications are present within the cavernous internal carotid arteries. Skull: Normal.  Negative for fracture or focal lesion. Sinuses/Orbits: No acute finding. Other: None. IMPRESSION: 1. No acute intracranial process. 2. Mild diffuse atrophy. Electronically Signed   By: Tyron Gallon M.D.   On: 05/03/2023 16:47   DG Chest Portable 1 View Result Date: 05/03/2023 CLINICAL DATA:  Weakness, lung cancer. EXAM: PORTABLE CHEST 1 VIEW COMPARISON:  May 05, 2012.  April 19, 2023. FINDINGS: Stable cardiomegaly. Elevated right hemidiaphragm is noted. Right upper lobe mass is noted consistent with malignancy noted on PET scan. Diffuse pleural thickening is noted with minimal right basilar subsegmental atelectasis or scarring. Minimal left basilar subsegmental atelectasis is noted. Status post surgical posterior fusion of cervical and upper thoracic spine. IMPRESSION: Right upper lobe mass is noted consistent with malignancy as noted on prior PET scan. Diffuse right-sided pleural thickening is noted with minimal right basilar subsegmental atelectasis or scarring. Minimal left basilar subsegmental atelectasis is noted. Electronically Signed   By: Rosalene Colon M.D.   On: 05/03/2023 15:14        Scheduled Meds:  aspirin  EC  81 mg Oral Daily   enoxaparin  (LOVENOX ) injection  40 mg Subcutaneous  Q24H   feeding supplement  237 mL Oral BID BM   lidocaine   1 patch Transdermal Q24H   metoprolol  tartrate  25 mg Oral BID   nicotine   21 mg Transdermal Daily   pantoprazole   40 mg Oral Daily   polyethylene glycol  17 g Oral Daily   umeclidinium-vilanterol  1 puff Inhalation Daily   Continuous Infusions:  cefTRIAXone  (ROCEPHIN )  IV Stopped (05/04/23 0350)     LOS: 0 days     Raymonde Calico, MD [facility]   If 7PM-7AM, please contact night-coverage [email or web address] Password [facility] 05/04/2023, 8:47 AM

## 2023-05-04 NOTE — Plan of Care (Signed)
   Problem: Nutrition: Goal: Adequate nutrition will be maintained Outcome: Progressing   Problem: Coping: Goal: Level of anxiety will decrease Outcome: Progressing

## 2023-05-04 NOTE — ED Notes (Addendum)
 Pts brief is wet, Pt stated he did not want to get changed at this time, educated on importance of skin breakdown, this RN will come back later to get Pt changed

## 2023-05-04 NOTE — ED Notes (Signed)
 This RN went back in to assess Pt and to get Pt cleaned up. Family @bedside  and was informed of what happened. When talking to POA, this RN explained we have pain medications ordered if he is in pain, especially when getting changed. Pts family states "he states he thinks he will pass out". Pt asked to this RN to step out so he could call some family and ask for advice. RN stepped out and will try again at a later time to get Pt cleaned up

## 2023-05-04 NOTE — ED Notes (Signed)
 This RN went into room again to see if ready to be changed and be given morning meds, family at bedside and Pt still on phone. Stated he still is saying his goodbyes and needs to talk to a few more individuals

## 2023-05-04 NOTE — ED Notes (Signed)
 Pt finally agreed to being changed and getting morning meds, PRN moprhine given before changing. New brief and bedding placed, family @bedside , Pt has room assignment and will go up shortly

## 2023-05-04 NOTE — ED Notes (Addendum)
 Pts brief is full, and bed is covered in urine. Pt refuses to be changed. Educated again on importance to prevent skin breakdown. Pt reports he is in too much pain, offered PRN oxycodone  and morphine . Pt refused. Family @bedside  and attempting to convince Pt at this time

## 2023-05-04 NOTE — ED Notes (Addendum)
 Family explained Pt is feeling upset from speaking with MD about his Ca spreading, is asking for time to speak with family before being medicated for pain and getting cleaned up. Medications will be delayed at this time, awaiting Pt to use call light when ready to be changed

## 2023-05-05 DIAGNOSIS — M5441 Lumbago with sciatica, right side: Secondary | ICD-10-CM | POA: Diagnosis not present

## 2023-05-05 DIAGNOSIS — R918 Other nonspecific abnormal finding of lung field: Secondary | ICD-10-CM | POA: Diagnosis not present

## 2023-05-05 DIAGNOSIS — R627 Adult failure to thrive: Secondary | ICD-10-CM | POA: Diagnosis not present

## 2023-05-05 DIAGNOSIS — C799 Secondary malignant neoplasm of unspecified site: Secondary | ICD-10-CM | POA: Diagnosis not present

## 2023-05-05 DIAGNOSIS — G8929 Other chronic pain: Secondary | ICD-10-CM

## 2023-05-05 DIAGNOSIS — J449 Chronic obstructive pulmonary disease, unspecified: Secondary | ICD-10-CM | POA: Diagnosis not present

## 2023-05-05 LAB — BLOOD CULTURE ID PANEL (REFLEXED) - BCID2

## 2023-05-05 LAB — URINE CULTURE: Culture: 20000 — AB

## 2023-05-05 LAB — BASIC METABOLIC PANEL WITH GFR
Anion gap: 11 (ref 5–15)
BUN: 46 mg/dL — ABNORMAL HIGH (ref 8–23)
CO2: 21 mmol/L — ABNORMAL LOW (ref 22–32)
Calcium: 9 mg/dL (ref 8.9–10.3)
Chloride: 112 mmol/L — ABNORMAL HIGH (ref 98–111)
Creatinine, Ser: 1.15 mg/dL (ref 0.61–1.24)
GFR, Estimated: >60 mL/min (ref >60)
Glucose, Bld: 134 mg/dL — ABNORMAL HIGH (ref 70–99)
Potassium: 4 mmol/L (ref 3.5–5.1)
Sodium: 144 mmol/L (ref 135–145)

## 2023-05-05 MED ORDER — SODIUM CHLORIDE 0.9 % IV SOLN
1.0000 g | Freq: Once | INTRAVENOUS | Status: DC
  Administered 2023-05-05: 1 g via INTRAVENOUS
  Filled 2023-05-05: qty 10

## 2023-05-05 MED ORDER — MORPHINE SULFATE (CONCENTRATE) 10 MG /0.5 ML PO SOLN: 10.0000 mg | ORAL | Status: DC | PRN

## 2023-05-05 MED ORDER — OXYCODONE HCL 5 MG PO TABS: 10.0000 mg | ORAL_TABLET | Freq: Four times a day (QID) | ORAL | Status: DC | PRN

## 2023-05-05 MED ORDER — MORPHINE SULFATE (CONCENTRATE) 10 MG /0.5 ML PO SOLN: 10.0000 mg | ORAL | 0 refills | Status: DC | PRN

## 2023-05-05 MED ORDER — MORPHINE SULFATE (PF) 2 MG/ML IV SOLN
2.0000 mg | INTRAVENOUS | Status: DC | PRN
  Administered 2023-05-05: 2 mg via INTRAVENOUS
  Filled 2023-05-05: qty 1

## 2023-05-05 MED ORDER — SODIUM CHLORIDE 0.9 % IV SOLN: 2.0000 g | INTRAVENOUS | Status: DC

## 2023-05-05 NOTE — Evaluation (Signed)
 Physical Therapy Evaluation Patient Details Name: Cameron Howe MRN: [id number] DOB: 1945-04-10 Today's Date: 05/05/2023  History of Present Illness  Pt is a 78 y.o. male presenting to hospital 05/03/23 with concerns for failure to thrive (generalized weakness, burning on urination, FTT, B hip pain); recent admission 3/21-4/1 (worsening sciatica pain).  Pt admitted with UTI, R lung mass, FTT in adult, myocardial injury (likely demand ischemia), AKI, chronic LBP with sciatica.  PMH includes htn, HLD, COPD, peripheral neuropathy, chronic back pain with sciatic pain, colon CA (s/p partial colectomy, chemo, and radiation therapy); recent found lung mass with widespread metastatic disease (L gluteal mass, L iliac bone and R femur lesion).  Clinical Impression  Prior to recent hospitalizations, pt was ambulatory and living alone; most recently pt has been at rehab but limited d/t LBP and R>L LE pain (pt had been using hoyer lift to go to w/c but unable to sit up now d/t significant pain; pt has been focusing on UE ex's/activities d/t too much pain with LE's).  Pt reporting 7/10 LBP and R>L LE pain during session and requesting pain medication--nurse notified of pt's request for pain medication; bed mobility/OOB mobility deferred d/t pt reporting significant pain with any movement in bed but pt did allow therapist to assist with extending B LE's in bed per pt tolerance to improve comfort (L LE able to be extended more than R LE; R LE appearing stiff).  Pt would currently benefit from skilled PT to address noted impairments and functional limitations per pt tolerance (see below for any additional details).    If plan is discharge home, recommend the following: Two people to help with walking and/or transfers;Two people to help with bathing/dressing/bathroom;Assistance with cooking/housework;Assist for transportation;Help with stairs or ramp for entrance   Can travel by private vehicle   No    Equipment  Recommendations Other (comment) (TBD at next facility)  Recommendations for Other Services       Functional Status Assessment Patient has had a recent decline in their functional status and/or demonstrates limited ability to make significant improvements in function in a reasonable and predictable amount of time     Precautions / Restrictions Precautions Precautions: Fall Recall of Precautions/Restrictions: Impaired Precaution/Restrictions Comments: Metastatic disease      Mobility  Bed Mobility               General bed mobility comments: Deferred d/t pt reporting significant pain with any movement    Transfers                        Ambulation/Gait                  Stairs            Wheelchair Mobility     Tilt Bed    Modified Rankin (Stroke Patients Only)       Balance                                             Pertinent Vitals/Pain Pain Assessment Pain Assessment: 0-10 Pain Score: 7  Pain Location: LBP and R>L LE/hip pain Pain Descriptors / Indicators: Guarding, Discomfort, Constant, Grimacing Pain Intervention(s): Limited activity within patient's tolerance, Monitored during session, Repositioned, Patient requesting pain meds-RN notified    Home Living Family/patient expects to be discharged to:: Skilled nursing  facility                   Additional Comments: Pt has been at [facility] recently (was living alone in 1 level home with 5 STE).    Prior Function Prior Level of Function : History of Falls (last six months);Needs assist             Mobility Comments: Was ambulatory prior to recent hospitalizations (mod I with rollator; h/o falls d/t R LE buckling).  Most recently at rehab focusing on UE ex's d/t limited d/t LBP and LE pain (pt used hoyer lift to get to w/c but recently has not been able to sit d/t significant pain)       Extremity/Trunk Assessment   Upper Extremity  Assessment Upper Extremity Assessment: Generalized weakness    Lower Extremity Assessment Lower Extremity Assessment: RLE deficits/detail;LLE deficits/detail RLE Deficits / Details: unable to fully extend R LE d/t stiffness and pain (pt keeping R LE in moderately flexed position and R ankle in plantarflexion) RLE: Unable to fully assess due to pain LLE Deficits / Details: able to almost fully extend L LE with assist (mild L hip and knee flexion and ankle DF noted) LLE: Unable to fully assess due to pain       Communication   Communication Communication: No apparent difficulties    Cognition Arousal: Alert Behavior During Therapy: WFL for tasks assessed/performed   PT - Cognitive impairments: Orientation, Awareness, Memory   Orientation impairments: Time (pt knew month/year but not day; generalized confusion noted (pt did not introduce visitor by correct name/description))                   PT - Cognition Comments: Pt appeared confused regarding recent things that occurred during hospitalization (pt reporting things happening recently when they were not recent)         Cueing Cueing Techniques: Verbal cues     General Comments  Nursing cleared pt for participation in physical therapy.  Pt's friend present and then pt's sister arrived.    Exercises     Assessment/Plan    PT Assessment Patient needs continued PT services  PT Problem List Decreased strength;Decreased range of motion;Decreased activity tolerance;Decreased balance;Decreased mobility;Decreased cognition;Decreased knowledge of precautions;Pain       PT Treatment Interventions DME instruction;Functional mobility training;Therapeutic activities;Therapeutic exercise;Balance training;Patient/family education    PT Goals (Current goals can be found in the Care Plan section)  Acute Rehab PT Goals Patient Stated Goal: to improve pain PT Goal Formulation: With patient Time For Goal Achievement:  05/19/23 Potential to Achieve Goals: Fair    Frequency Min 1X/week     Co-evaluation               AM-PAC PT "6 Clicks" Mobility  Outcome Measure Help needed turning from your back to your side while in a flat bed without using bedrails?: A Lot Help needed moving from lying on your back to sitting on the side of a flat bed without using bedrails?: Total Help needed moving to and from a bed to a chair (including a wheelchair)?: Total Help needed standing up from a chair using your arms (e.g., wheelchair or bedside chair)?: Total Help needed to walk in hospital room?: Total Help needed climbing 3-5 steps with a railing? : Total 6 Click Score: 7    End of Session   Activity Tolerance: Patient limited by pain Patient left: in bed;with call bell/phone within reach;with bed alarm set;with  nursing/sitter in room;with family/visitor present Nurse Communication: Mobility status;Patient requests pain meds PT Visit Diagnosis: Other abnormalities of gait and mobility (R26.89);Muscle weakness (generalized) (M62.81);Pain Pain - Right/Left: Right (B) Pain - part of body: Hip;Knee;Leg;Ankle and joints of foot    Time: 1610-9604 PT Time Calculation (min) (ACUTE ONLY): 14 min   Charges:   PT Evaluation $PT Eval Low Complexity: 1 Low   PT General Charges $$ ACUTE PT VISIT: 1 Visit        Amador Junes, PT 05/05/23, 9:53 AM

## 2023-05-05 NOTE — Progress Notes (Signed)
 PHARMACY - PHYSICIAN COMMUNICATION CRITICAL VALUE ALERT - BLOOD CULTURE IDENTIFICATION (BCID)  Cameron Howe is an 78 y.o. male who presented to [facility] on 05/03/2023 with a chief complaint of dysuria and weakness. Newly found lung mass with mets.    Assessment:  blood culture from 4/23 with GNR and BCID detects P mirabilis.  Urine culture with P. Mirabilis (sensitive organism)    Name of physician (or Provider) Contacted: Dr Lydia Sams  Current antibiotics: Ceftriaxone   Changes to prescribed antibiotics recommended:  - Patient is now comfort measures only   Results for orders placed or performed during the hospital encounter of 05/03/23  Blood Culture ID Panel (Reflexed) (Collected: 05/03/2023  3:40 PM)  Result Value Ref Range   Enterococcus faecalis NOT DETECTED NOT DETECTED   Enterococcus Faecium NOT DETECTED NOT DETECTED   Listeria monocytogenes NOT DETECTED NOT DETECTED   Staphylococcus species NOT DETECTED NOT DETECTED   Staphylococcus aureus (BCID) NOT DETECTED NOT DETECTED   Staphylococcus epidermidis NOT DETECTED NOT DETECTED   Staphylococcus lugdunensis NOT DETECTED NOT DETECTED   Streptococcus species NOT DETECTED NOT DETECTED   Streptococcus agalactiae NOT DETECTED NOT DETECTED   Streptococcus pneumoniae NOT DETECTED NOT DETECTED   Streptococcus pyogenes NOT DETECTED NOT DETECTED   A.calcoaceticus-baumannii NOT DETECTED NOT DETECTED   Bacteroides fragilis NOT DETECTED NOT DETECTED   Enterobacterales DETECTED (A) NOT DETECTED   Enterobacter cloacae complex NOT DETECTED NOT DETECTED   Escherichia coli NOT DETECTED NOT DETECTED   Klebsiella aerogenes NOT DETECTED NOT DETECTED   Klebsiella oxytoca NOT DETECTED NOT DETECTED   Klebsiella pneumoniae NOT DETECTED NOT DETECTED   Proteus species DETECTED (A) NOT DETECTED   Salmonella species NOT DETECTED NOT DETECTED   Serratia marcescens NOT DETECTED NOT DETECTED   Haemophilus influenzae NOT DETECTED NOT DETECTED   Neisseria  meningitidis NOT DETECTED NOT DETECTED   Pseudomonas aeruginosa NOT DETECTED NOT DETECTED   Stenotrophomonas maltophilia NOT DETECTED NOT DETECTED   Candida albicans NOT DETECTED NOT DETECTED   Candida auris NOT DETECTED NOT DETECTED   Candida glabrata NOT DETECTED NOT DETECTED   Candida krusei NOT DETECTED NOT DETECTED   Candida parapsilosis NOT DETECTED NOT DETECTED   Candida tropicalis NOT DETECTED NOT DETECTED   Cryptococcus neoformans/gattii NOT DETECTED NOT DETECTED   CTX-M ESBL NOT DETECTED NOT DETECTED   Carbapenem resistance IMP NOT DETECTED NOT DETECTED   Carbapenem resistance KPC NOT DETECTED NOT DETECTED   Carbapenem resistance NDM NOT DETECTED NOT DETECTED   Carbapenem resist OXA 48 LIKE NOT DETECTED NOT DETECTED   Carbapenem resistance VIM NOT DETECTED NOT DETECTED    Valma Gazella, PharmD, BCPS, BCIDP Work Cell: [phone] 05/05/2023 12:10 PM

## 2023-05-05 NOTE — Progress Notes (Signed)
 [facility] LIAISON NOTE  Received request from Rozelle Corning RN, Transitions of Care Manager, for evaluation for Hospice InPatient Unit [facility]). Spoke with Cameron Howe at bedside- to initiate education related to hospice philosophy, services, and team approach to care. No family present.  At the end of my discussion with the patient, patient's pastor came to visit.    I called and left a message with patient's sister, Cameron Howe, [phone].  I will await a call back to offer a bed and to discuss IPU services and possible related cost.   Spoke with Dr. Tessie Fila, Hospice MD who approved patient to come to the [facility] for GIP level of care for unmanaged pain.  Above information shared with Rozelle Corning, Transitions of Care Manager and hospital medical care team.  Please call with any hospice related questions or concerns.  Thank you for the opportunity to participate in this patient's care.  Ambrosio Junker, MA, BSN, RN, FNE Nurse Liaison [phone]

## 2023-05-05 NOTE — TOC Transition Note (Addendum)
 Transition of Care [facility]) - Discharge Note   Patient Details  Name: Cameron Howe MRN: [id number] Date of Birth: [birth year]  [facility]) CM/SW Contact:  Elsie Halo, RN Phone Number: 05/05/2023, 3:27 PM   Clinical Narrative:    Patient is medically clear to dc to [facility] inpt hospice. TOC arranged transportation with [facility]. Patient and family are agreeable with plan.   Final next level of care: Hospice Medical Facility     Patient Goals and CMS Choice            Discharge Placement              Patient chooses bed at:  [facility]) Patient to be transferred to facility by: [facility]      Discharge Plan and Services Additional resources added to the After Visit Summary for                                       Social Drivers of Health (SDOH) Interventions SDOH Screenings   Food Insecurity: Patient Declined (05/04/2023)  Housing: Patient Declined (05/04/2023)  Transportation Needs: Patient Declined (05/04/2023)  Utilities: Patient Declined (05/04/2023)  Financial Resource Strain: Low Risk  (04/03/2023)   Received from [facility]  Social Connections: Unknown (05/04/2023)  Recent Concern: Social Connections - Moderately Isolated (03/31/2023)  Tobacco Use: High Risk (05/03/2023)     Readmission Risk Interventions     No data to display

## 2023-05-05 NOTE — Consult Note (Signed)
 [facility]  [phone] [phone]  ID: Cameron Howe OB: [birth year]  MR#: [id number]  AOZ#:[id number]  Patient Care Team: Antonio Baumgarten, MD as PCP - General (Internal Medicine) Rochell Chroman, RN as Oncology Nurse Navigator Drake Gens, RN as Oncology Nurse Navigator Adrian Alba, Deadra Everts, MD as Consulting Physician (Oncology)  CHIEF COMPLAINT: Likely metastatic lung cancer, failure to thrive.  INTERVAL HISTORY: Patient is a 78 year old male actively being worked up for presumed metastatic lung cancer who is readmitted to the hospital with declining performance status and failure to thrive.  He recently decided to forego any further diagnostic testing and is elected for comfort care and hospice.  He will likely be transferred to the hospice home.  REVIEW OF SYSTEMS:   Review of Systems  Constitutional:  Positive for malaise/fatigue and weight loss. Negative for fever.  Respiratory: Negative.  Negative for cough and shortness of breath.   Cardiovascular: Negative.  Negative for chest pain and leg swelling.  Gastrointestinal: Negative.  Negative for abdominal pain.  Genitourinary: Negative.  Negative for dysuria.  Musculoskeletal: Negative.  Negative for back pain.  Skin: Negative.  Negative for rash.  Neurological:  Positive for weakness. Negative for dizziness, focal weakness and headaches.  Psychiatric/Behavioral: Negative.  The patient is not nervous/anxious.     As per HPI. Otherwise, a complete review of systems is negative.  PAST MEDICAL HISTORY: Past Medical History:  Diagnosis Date   Arthritis    SHOULDERS AND JOINTS   Cancer [facility])    Colon cancer 11/2010 with parital resection with chemo and rad tx   COPD (chronic obstructive pulmonary disease) (HCC)    Dental bridge present    REMOVABLE   Diabetes mellitus without complication (HCC)    TYPE 2, DIET CONTROLLED   Elevated PSA    Hypertension    Neuromuscular disorder  (HCC)    WEAKNESS AND NUMBNESS HANDS AND FEET, POSSIBLY FROM CHEMO   Tobacco abuse     PAST SURGICAL HISTORY: Past Surgical History:  Procedure Laterality Date   CERVICAL DISCECTOMY     CERVICAL FUSION     COLON SURGERY  NOV 2012   COLON REMOVED   COLON SURGERY  APRIL 2014   OSTOMY REMOVED   COLONOSCOPY N/A 04/21/2015   Procedure: COLONOSCOPY;  Surgeon: Stephens Eis, MD;  Location: [facility];  Service: Gastroenterology;  Laterality: N/A;  DIABETIC, DIET CONTROLLED   COLONOSCOPY WITH PROPOFOL  N/A 11/07/2018   Procedure: COLONOSCOPY WITH PROPOFOL ;  Surgeon: Marshall Skeeter, MD;  Location: [facility];  Service: Endoscopy;  Laterality: N/A;   COLOSTOMY REVISION     hx of colostomy     LAMINECTOMY     POLYPECTOMY N/A 04/21/2015   Procedure: POLYPECTOMY;  Surgeon: Stephens Eis, MD;  Location: [facility];  Service: Gastroenterology;  Laterality: N/A;   TUNNELED VENOUS PORT PLACEMENT      FAMILY HISTORY: History reviewed. No pertinent family history.  ADVANCED DIRECTIVES (Y/N):  @ADVDIR @  HEALTH MAINTENANCE: Social History   Tobacco Use   Smoking status: Every Day    Current packs/day: 0.50    Average packs/day: 0.5 packs/day for 30.0 years (15.0 ttl pk-yrs)    Types: Cigarettes   Smokeless tobacco: Never  Substance Use Topics   Alcohol use: No    Alcohol/week: 0.0 standard drinks of alcohol   Drug use: No     Colonoscopy:  PAP:  Bone density:  Lipid panel:  No Known Allergies  Current Facility-Administered  Medications  Medication Dose Route Frequency Provider Last Rate Last Admin   acetaminophen  (TYLENOL ) tablet 650 mg  650 mg Oral Q6H PRN Niu, Xilin, MD   650 mg at 05/03/23 2353   albuterol  (PROVENTIL ) (2.5 MG/3ML) 0.083% nebulizer solution 2.5 mg  2.5 mg Inhalation Q4H PRN Niu, Xilin, MD       bisacodyl  (DULCOLAX) suppository 10 mg  10 mg Rectal Daily PRN Niu, Xilin, MD       dextromethorphan-guaiFENesin  (MUCINEX  DM) 30-600 MG per 12 hr tablet 1  tablet  1 tablet Oral BID PRN Niu, Xilin, MD       feeding supplement (ENSURE ENLIVE / ENSURE PLUS) liquid 237 mL  237 mL Oral BID BM Niu, Xilin, MD   237 mL at 05/05/23 0918   [START ON 05/06/2023] fentaNYL  (DURAGESIC ) 12 MCG/HR 1 patch  1 patch Transdermal Q72H Wouk, Haynes Lips, MD       lidocaine  (LIDODERM ) 5 % 1 patch  1 patch Transdermal Q24H Niu, Xilin, MD   1 patch at 05/05/23 0918   morphine  (PF) 2 MG/ML injection 2 mg  2 mg Intravenous Q4H PRN Niu, Xilin, MD   2 mg at 05/05/23 1332   nicotine  (NICODERM CQ  - dosed in mg/24 hours) patch 21 mg  21 mg Transdermal Daily Niu, Xilin, MD   21 mg at 05/05/23 7425   ondansetron  (ZOFRAN ) injection 4 mg  4 mg Intravenous Q8H PRN Niu, Xilin, MD       oxyCODONE  (Oxy IR/ROXICODONE ) immediate release tablet 10 mg  10 mg Oral Q6H PRN Patel, Sona, MD       polyethylene glycol (MIRALAX  / GLYCOLAX ) packet 17 g  17 g Oral Daily Niu, Xilin, MD   17 g at 05/05/23 9563   traZODone  (DESYREL ) tablet 25 mg  25 mg Oral QHS PRN Niu, Xilin, MD   25 mg at 05/04/23 2059   umeclidinium-vilanterol (ANORO ELLIPTA ) 62.5-25 MCG/ACT 1 puff  1 puff Inhalation Daily Niu, Xilin, MD   1 puff at 05/05/23 0929   Facility-Administered Medications Ordered in Other Encounters  Medication Dose Route Frequency Provider Last Rate Last Admin   sodium chloride  flush (NS) 0.9 % injection 10 mL  10 mL Intravenous PRN Kirk Basquez J, MD   10 mL at 02/16/18 0949    OBJECTIVE: Vitals:   05/05/23 0448 05/05/23 0853  BP: 133/60 132/61  Pulse: 91 90  Resp: 18 18  Temp: 97.6 F (36.4 C) 98.5 F (36.9 C)  SpO2: 97% 91%     Body mass index is 22.13 kg/m.    ECOG FS:4 - Bedbound  General: Cachectic, no acute distress. Eyes: Pink conjunctiva, anicteric sclera. HEENT: Normocephalic, moist mucous membranes. Lungs: No audible wheezing or coughing. Heart: Regular rate and rhythm. Abdomen: Soft, nontender, no obvious distention. Musculoskeletal: No edema, cyanosis, or  clubbing. Neuro: Alert, answering all questions appropriately. Cranial nerves grossly intact. Skin: No rashes or petechiae noted. Psych: Normal affect.  LAB RESULTS:  Lab Results  Component Value Date   NA 144 05/05/2023   K 4.0 05/05/2023   CL 112 (H) 05/05/2023   CO2 21 (L) 05/05/2023   GLUCOSE 134 (H) 05/05/2023   BUN 46 (H) 05/05/2023   CREATININE 1.15 05/05/2023   CALCIUM 9.0 05/05/2023   PROT 6.8 05/03/2023   ALBUMIN 2.1 (L) 05/03/2023   AST 25 05/03/2023   ALT 37 05/03/2023   ALKPHOS 132 (H) 05/03/2023   BILITOT 1.0 05/03/2023   GFRNONAA >60 05/05/2023   GFRAA >  60 02/05/2016    Lab Results  Component Value Date   WBC 19.7 (H) 05/04/2023   NEUTROABS 17.5 (H) 05/03/2023   HGB 11.0 (L) 05/04/2023   HCT 34.6 (L) 05/04/2023   MCV 82.2 05/04/2023   PLT 180 05/04/2023     STUDIES: CT ABDOMEN PELVIS W CONTRAST Result Date: 05/03/2023 CLINICAL DATA:  Abdominal pain.  Metastatic right lung mass. EXAM: CT ABDOMEN AND PELVIS WITH CONTRAST TECHNIQUE: Multidetector CT imaging of the abdomen and pelvis was performed using the standard protocol following bolus administration of intravenous contrast. RADIATION DOSE REDUCTION: This exam was performed according to the departmental dose-optimization program which includes automated exposure control, adjustment of the mA and/or kV according to patient size and/or use of iterative reconstruction technique. CONTRAST:  OMNIPAQUE  IOHEXOL  350 MG/ML SOLN COMPARISON:  CT abdomen pelvis dated 04/01/2023. FINDINGS: Lower chest: Interval progression of masslike thickening and consolidative changes of the right lung base and pleural surface in keeping with known malignancy. Increase in the size of subcarinal adenopathy measuring approximately 2.1 cm in short axis. No intra-abdominal free air.  Trace subhepatic fluid. Hepatobiliary: Subcentimeter left hepatic hypodense lesion is too small to characterize. There is otherwise unremarkable. No  dilatation. No calcified gallstone. Pancreas: The pancreas is unremarkable as visualized. Spleen: Normal in size without focal abnormality. Adrenals/Urinary Tract: Increase in the size of right adrenal nodule measuring 16 mm on today's exam most concerning for metastatic disease. Bilateral renal cysts as seen previously. There is no hydronephrosis on either side. There is symmetric enhancement and excretion of contrast by both kidneys. There is diffuse trabeculation of the bladder wall suggestive of chronic bladder outlet obstruction. Stomach/Bowel: Right inguinal hernia containing several loops of small bowel. Postsurgical changes of sigmoid colon with anastomotic staple line. There is no bowel obstruction. The appendix is not identified, likely surgically absent. Vascular/Lymphatic: Moderate aortoiliac atherosclerotic disease. There is a 2.3 cm infrarenal aortic ectasia. The IVC is unremarkable. No portal venous gas. There is no adenopathy. Reproductive: Enlarged prostate gland measuring 6 cm in transverse axial diameter. Other: None Musculoskeletal: Osteopenia with degenerative changes of the spine and scoliosis. Increase in the size of the left gluteal mass since the prior CT measuring approximately 5.5 x 8.0 cm in greatest axial dimensions (previously 5.5 x 4.5 cm). Progression of predominantly lytic lesion in the proximal right femur with cortical breakage measuring approximately 3.3 x 4.2 cm in greatest axial dimension. IMPRESSION: 1. Progression of pulmonary and pleural malignancy in the visualized right lung base as well as progression of mediastinal adenopathy. 2. Increase in the size of right adrenal nodule as well as increase in the size of the left gluteal mass and lesion in the proximal right femur. 3. Right inguinal hernia containing several loops of small bowel. No bowel obstruction. 4. Enlarged prostate gland with findings of chronic bladder outlet obstruction. 5.  Aortic Atherosclerosis  (ICD10-I70.0). Electronically Signed   By: Angus Bark M.D.   On: 05/03/2023 16:58   CT Angio Chest PE W and/or Wo Contrast Result Date: 05/03/2023 CLINICAL DATA:  Pulmonary embolism suspected EXAM: CT ANGIOGRAPHY CHEST WITH CONTRAST TECHNIQUE: Multidetector CT imaging of the chest was performed using the standard protocol during bolus administration of intravenous contrast. Multiplanar CT image reconstructions and MIPs were obtained to evaluate the vascular anatomy. RADIATION DOSE REDUCTION: This exam was performed according to the departmental dose-optimization program which includes automated exposure control, adjustment of the mA and/or kV according to patient size and/or use of iterative reconstruction technique.  CONTRAST:  OMNIPAQUE  IOHEXOL  350 MG/ML SOLN COMPARISON:  March 21, 2023 FINDINGS: Cardiovascular: Satisfactory opacification of the pulmonary arteries to the segmental level. No evidence of pulmonary embolism. Normal heart size. No pericardial effusion. Coronary artery calcifications. Mediastinum/Nodes: Aortic pulmonic window adenopathy measuring 2 by 1.1 cm, and 1.7 x 2 cm. Lungs/Pleura: Comparison with prior examination there is interval increase in the size of the previously described right upper lobe mass inseparable from the adjacent mediastinum measuring 6.6 x 6.4 cm AP and transverse diameter several fundal right hilum mediastinum. With significant elevation of the right hemidiaphragm and volume loss of the right lower lobe and right middle lobe. Small right pleural reaction without significant right pleural effusion. Significant pleural thickening has worsened since prior examination. The left lung demonstrates a chronic interstitial lung disease and centrilobular emphysema. Upper Abdomen: An ill-defined bilobed mass in the right lateral chest wall intercostal space along the anterior and mid axillary lines. This mass measures at least 6.2 x 3 cm in maximum AP and transverse  diameter and is producing some degree of compression and in indentation of the right lobe of the liver. It does appear larger than on prior examination. May correlate with metastatic disease. Musculoskeletal: No obvious fractures.  No other lytic bone lesions. Review of the MIP images confirms the above findings. IMPRESSION: Above described findings indicate worsening right upper lobe mass. Which is highly suggestive of a malignant neoplastic process such as bronchogenic carcinoma. Inseparable from the adjacent mediastinum and hilum. With postobstructive pneumonia pneumonitis and bronchiectasis and significant pleural thickening. Interval increase in size of an mass in the right lateral costophrenic sulcus which extends in the subhepatic perihepatic region as described producing indentation and deformity of the right lobe of the liver which suggests metastatic disease probably within the pleural reflection. There is also some nodularity with at the level of the right anterior cardiophrenic angle measuring about 2.5 x 3.6 cm which is similar to prior examination and may correlate with diaphragmatic cardiophrenic adenopathy. No evidence of pulmonary embolism. Electronically Signed   By: Fredrich Jefferson M.D.   On: 05/03/2023 16:50   CT HEAD WO CONTRAST ( ) Result Date: 05/03/2023 CLINICAL DATA:  Trauma EXAM: CT HEAD WITHOUT CONTRAST TECHNIQUE: Contiguous axial images were obtained from the base of the skull through the vertex without intravenous contrast. RADIATION DOSE REDUCTION: This exam was performed according to the departmental dose-optimization program which includes automated exposure control, adjustment of the mA and/or kV according to patient size and/or use of iterative reconstruction technique. COMPARISON:  None Available. FINDINGS: Brain: No evidence of acute infarction, hemorrhage, hydrocephalus, extra-axial collection or mass lesion/mass effect. There is mild diffuse atrophy. Vascular: Atherosclerotic  calcifications are present within the cavernous internal carotid arteries. Skull: Normal. Negative for fracture or focal lesion. Sinuses/Orbits: No acute finding. Other: None. IMPRESSION: 1. No acute intracranial process. 2. Mild diffuse atrophy. Electronically Signed   By: Tyron Gallon M.D.   On: 05/03/2023 16:47   DG Chest Portable 1 View Result Date: 05/03/2023 CLINICAL DATA:  Weakness, lung cancer. EXAM: PORTABLE CHEST 1 VIEW COMPARISON:  May 05, 2012.  April 19, 2023. FINDINGS: Stable cardiomegaly. Elevated right hemidiaphragm is noted. Right upper lobe mass is noted consistent with malignancy noted on PET scan. Diffuse pleural thickening is noted with minimal right basilar subsegmental atelectasis or scarring. Minimal left basilar subsegmental atelectasis is noted. Status post surgical posterior fusion of cervical and upper thoracic spine. IMPRESSION: Right upper lobe mass is noted consistent with malignancy as  noted on prior PET scan. Diffuse right-sided pleural thickening is noted with minimal right basilar subsegmental atelectasis or scarring. Minimal left basilar subsegmental atelectasis is noted. Electronically Signed   By: Rosalene Colon M.D.   On: 05/03/2023 15:14   NM PET Image Initial (PI) Skull Base To Thigh Result Date: 04/19/2023 CLINICAL DATA:  Initial treatment strategy for right lung mass. History of colon cancer. Additional findings concerning for metastatic disease on CT. EXAM: NUCLEAR MEDICINE PET SKULL BASE TO THIGH TECHNIQUE: 9.28 mCi F-18 FDG was injected intravenously. Full-ring PET imaging was performed from the skull base to thigh after the radiotracer. CT data was obtained and used for attenuation correction and anatomic localization. Fasting blood glucose: 119 mg/dl COMPARISON:  Chest CT 40/98/1191. Abdominopelvic CT 04/01/2023. PET-CT 01/15/2013. FINDINGS: Mediastinal blood pool activity: SUV max 2.4 NECK: No hypermetabolic cervical lymph nodes are identified. No suspicious  activity identified within the pharyngeal mucosal space. Asymmetric muscular activity in the left neck, within physiologic limits. Incidental CT findings: Bilateral carotid atherosclerosis. CHEST: There are hypermetabolic mediastinal lymph nodes, including AP window lymph nodes measuring up to 8 mm on image 44/6 (SUV max 11.8) and a 1.5 cm subcarinal node on image 51/6 (SUV max 11.7). As seen on recent CTs, there is irregular pleural thickening on the right with associated hypermetabolic activity (SUV max 7.6 medially and 14.8 laterally near the costophrenic angle), consistent with pleural metastatic disease. Resulting partial right lung collapse. Ill-defined right upper lobe mass is partially obscured by adjacent parenchymal opacity, but demonstrates focal hypermetabolic activity (SUV max 19.1). No hypermetabolic pulmonary activity or highly suspicious nodularity in the left lung. 8 mm left lower lobe nodule on image 49/6 is unchanged from recent CT, without hypermetabolic activity. Incidental CT findings: Underlying mild to moderate centrilobular and paraseptal emphysema. Diffuse atherosclerosis of the aorta, great vessels and coronary arteries. ABDOMEN/PELVIS: Hypermetabolic right adrenal mass measuring 1.8 x 1.4 cm on image 79/6 (SUV max 11.0). There is no hypermetabolic activity within the liver, pancreas, spleen or left adrenal gland. There is no hypermetabolic nodal activity in the abdomen or pelvis. Bowel activity within physiologic limits. No omental or peritoneal hypermetabolic activity identified. There is prostate gland is moderately enlarged with nonspecific focal hypermetabolic activity peripherally on the left (SUV max 8.1). Incidental CT findings: Unchanged cyst posteriorly in the left hepatic lobe (image 72/6) without hypermetabolic activity. There are cystic renal lesions bilaterally without hypermetabolic activity for which no specific follow-up imaging is recommended. Unchanged right inguinal  hernia containing small bowel. There is moderate stool throughout the colon status post sigmoid anastomosis. Diffuse aortic and branch vessel atherosclerosis. SKELETON: There are multiple hypermetabolic osseous metastases. A large expansile lesion involving the left iliac wing and adjacent iliacus and gluteus musculature demonstrates peripheral hypermetabolic activity (SUV max 10.1) and central necrosis. Lytic lesion involving the anterior intertrochanteric region of the right femur demonstrates an SUV max of 13.1. There are other lesions within the right iliac bone, left sacral ala and midthoracic spine. Incidental CT findings: Multilevel spondylosis. Previous multilevel cervicothoracic fusion. Advanced glenohumeral arthropathy, right greater than left. IMPRESSION: 1. Widespread metastatic disease as described with involvement of the right lung, right pleural space, mediastinal lymph nodes, right adrenal gland and multiple bones. 2. Hypermetabolic right upper lobe mass could reflect primary bronchogenic carcinoma or metastatic disease from the patient's colon cancer. 3. Nonspecific focal hypermetabolic activity peripherally in the left prostate gland. Correlate with PSA levels. 4. Aortic Atherosclerosis (ICD10-I70.0) and Emphysema (ICD10-J43.9). Electronically Signed   By:  Elmon Hagedorn M.D.   On: 04/19/2023 11:20   DG Abd 1 View Result Date: 04/07/2023 CLINICAL DATA:  Vomiting, small-bowel obstruction EXAM: ABDOMEN - 1 VIEW COMPARISON:  CT 04/01/2023 FINDINGS: The stomach remains mildly dilated. Multiple gas-filled mid abdominal bowel loops without overt dilatation. Right perihilar and basilar pulmonary opacities as before. Multilevel thoracolumbar spondylitic change. Aortoiliac calcified plaque. IMPRESSION: Persistent  gastric dilatation. Electronically Signed   By: Nicoletta Barrier M.D.   On: 04/07/2023 10:38    ASSESSMENT: Likely metastatic lung cancer, failure to thrive.  PLAN:    Likely metastatic lung  cancer: Patient has elected for hospice care, therefore will cancel biopsy as well as any further follow-up in the cancer center.  No further intervention is needed. Failure to thrive: Patient lives alone and likely will be transferred to the hospice home upon discharge. Pain: Increase oxycodone  to 10 mg.  Comfort care orders are in place.  Case discussed with hospitalist.  Appreciate consult, will follow.   Shellie Dials, MD   05/05/2023 2:20 PM

## 2023-05-05 NOTE — Discharge Summary (Signed)
 Physician Discharge Summary   Patient: Cameron Howe MRN: [id number] DOB: 1945-03-22  Admit date:     05/03/2023  Discharge date: 05/05/23  Discharge Physician: Melvinia Stager   PCP: Antonio Baumgarten, MD     Discharge Diagnoses: Principal Problem:   UTI (urinary tract infection) Active Problems:   Mass of right lung   Failure to thrive in adult   Myocardial injury   AKI (acute kidney injury) (HCC)   Essential hypertension   Dyslipidemia   COPD (chronic obstructive pulmonary disease) (HCC)   Chronic low back pain with sciatica   Current tobacco use   Type 2 diabetes mellitus (HCC)   Malignant neoplasm of sigmoid colon (HCC)   Metastatic malignant neoplasm (HCC)  Cameron Howe is a 78 y.o. male with medical history significant of hypertension, hyperlipidemia, COPD, peripheral neuropathy, chronic back pain with sciatic pain, colon cancer (s/p of partial colectomy, chemo and radiation therapy), recently found lung mass with widespread metastatic disease, who presents with burning on urination, generalized weakness, failure to thrive, bilateral hip pain.   Patient states that he has been feeling very weak recently, even cannot sit up without assistance.  He has poor appetite with decreased oral intake, failure to thrive.  He has burning on urination, no dysuria or hematuria.  He has a history of chronic back pain with sciatica pain, reporting that he has worsening pain in both hips, right hip is worse than the left.  The pain is severe, constant, sharp, nonradiating  PET scan on 04/19/23 1. Widespread metastatic disease as described with involvement of the right lung, right pleural space, mediastinal lymph nodes, right adrenal gland and multiple bones. 2. Hypermetabolic right upper lobe mass could reflect primary bronchogenic carcinoma or metastatic disease from the patient's colon cancer. 3. Nonspecific focal hypermetabolic activity peripherally in the left prostate gland. Correlate  with PSA levels. 4. Aortic Atherosclerosis (ICD10-I70.0) and Emphysema (ICD10-J43.9).   Failure to thrive/severe malnourishment  Metastatic disease likely due to Lung mass -- patient was seen by oncology Dr. Adrian Alba and palliative care Josh Borders. They had discussion with patient and family including patient's sister Valinda Gault. Patient has advanced metastatic disease likely due to lung mass until proven otherwise. Given significant amount of decline and poor functional capacity patient is a poor candidate at present. Discussion was made regarding hospice/comfort care and family is agreeable for patient to going to hospice facility for symptom management. He lives at home alone and are not a safe discharge for going home with hospice.  -- Sister is in agreement. Patient is made comfort care after discussion with patient family and Dr. Adrian Alba   UTI   HTN   COPD - albuterol  prn   Sciatica Chronic cancer pain - home fentanyl , oxy prn - pain control - bowel regimen --po prn roxanol   T2DM Diet controlled - monitor sugars  Overall poor prognosis. Family understands. Will discharge to inpatient hospice facility      Pain control - [place]  Controlled Substance Reporting System database was reviewed. and patient was instructed, not to drive, operate heavy machinery, perform activities at heights, swimming or participation in water  activities or provide baby-sitting services while on Pain, Sleep and Anxiety Medications; until their outpatient Physician has advised to do so again. Also recommended to not to take more than prescribed Pain, Sleep and Anxiety Medications.  Consultants: [facility], palliative care Disposition: Hospice care Diet recommendation:  Discharge Diet Orders (From admission, onward)     Start  Ordered   05/05/23 0000  Diet - low sodium heart healthy        05/05/23 1516           Regular diet DISCHARGE MEDICATION: Allergies as of 05/05/2023   No Known  Allergies      Medication List     STOP taking these medications    aspirin  81 MG tablet   atorvastatin 40 MG tablet Commonly known as: LIPITOR   furosemide 20 MG tablet Commonly known as: LASIX   hydrALAZINE  50 MG tablet Commonly known as: APRESOLINE    lidocaine  5 % Commonly known as: LIDODERM    metoprolol  tartrate 25 MG tablet Commonly known as: LOPRESSOR    naloxone 4 MG/0.1ML Liqd nasal spray kit Commonly known as: NARCAN   pantoprazole  40 MG tablet Commonly known as: PROTONIX        TAKE these medications    acetaminophen  650 MG CR tablet Commonly known as: TYLENOL  Take 650 mg by mouth every 8 (eight) hours as needed for pain.   albuterol  108 (90 Base) MCG/ACT inhaler Commonly known as: VENTOLIN  HFA Inhale 2 puffs into the lungs every 6 (six) hours as needed for wheezing or shortness of breath.   bisacodyl  10 MG suppository Commonly known as: DULCOLAX Place 1 suppository (10 mg total) rectally daily as needed for severe constipation. What changed: Another medication with the same name was removed. Continue taking this medication, and follow the directions you see here.   fentaNYL  12 MCG/HR Commonly known as: DURAGESIC  Place 1 patch onto the skin every 3 (three) days.   morphine  CONCENTRATE 10 mg / 0.5 ml concentrated solution Take 0.5 mLs (10 mg total) by mouth every 3 (three) hours as needed for severe pain (pain score 7-10).   oxyCODONE  5 MG immediate release tablet Commonly known as: Oxy IR/ROXICODONE  Take 1 tablet (5 mg total) by mouth every 6 (six) hours as needed for moderate pain (pain score 4-6) or severe pain (pain score 7-10).   polyethylene glycol 17 g packet Commonly known as: MIRALAX  / GLYCOLAX  Take 17 g by mouth daily. Skip the dose if no constipation   traZODone  50 MG tablet Commonly known as: DESYREL  Take 0.5 tablets (25 mg total) by mouth at bedtime as needed for sleep.   umeclidinium-vilanterol 62.5-25 MCG/ACT Aepb Commonly  known as: ANORO ELLIPTA  Inhale 1 puff into the lungs daily.            Condition at discharge: poor  The results of significant diagnostics from this hospitalization (including imaging, microbiology, ancillary and laboratory) are listed below for reference.   Imaging Studies: CT ABDOMEN PELVIS W CONTRAST Result Date: 05/03/2023 CLINICAL DATA:  Abdominal pain.  Metastatic right lung mass. EXAM: CT ABDOMEN AND PELVIS WITH CONTRAST TECHNIQUE: Multidetector CT imaging of the abdomen and pelvis was performed using the standard protocol following bolus administration of intravenous contrast. RADIATION DOSE REDUCTION: This exam was performed according to the departmental dose-optimization program which includes automated exposure control, adjustment of the mA and/or kV according to patient size and/or use of iterative reconstruction technique. CONTRAST:  OMNIPAQUE  IOHEXOL  350 MG/ML SOLN COMPARISON:  CT abdomen pelvis dated 04/01/2023. FINDINGS: Lower chest: Interval progression of masslike thickening and consolidative changes of the right lung base and pleural surface in keeping with known malignancy. Increase in the size of subcarinal adenopathy measuring approximately 2.1 cm in short axis. No intra-abdominal free air.  Trace subhepatic fluid. Hepatobiliary: Subcentimeter left hepatic hypodense lesion is too small to characterize. There is otherwise  unremarkable. No dilatation. No calcified gallstone. Pancreas: The pancreas is unremarkable as visualized. Spleen: Normal in size without focal abnormality. Adrenals/Urinary Tract: Increase in the size of right adrenal nodule measuring 16 mm on today's exam most concerning for metastatic disease. Bilateral renal cysts as seen previously. There is no hydronephrosis on either side. There is symmetric enhancement and excretion of contrast by both kidneys. There is diffuse trabeculation of the bladder wall suggestive of chronic bladder outlet obstruction.  Stomach/Bowel: Right inguinal hernia containing several loops of small bowel. Postsurgical changes of sigmoid colon with anastomotic staple line. There is no bowel obstruction. The appendix is not identified, likely surgically absent. Vascular/Lymphatic: Moderate aortoiliac atherosclerotic disease. There is a 2.3 cm infrarenal aortic ectasia. The IVC is unremarkable. No portal venous gas. There is no adenopathy. Reproductive: Enlarged prostate gland measuring 6 cm in transverse axial diameter. Other: None Musculoskeletal: Osteopenia with degenerative changes of the spine and scoliosis. Increase in the size of the left gluteal mass since the prior CT measuring approximately 5.5 x 8.0 cm in greatest axial dimensions (previously 5.5 x 4.5 cm). Progression of predominantly lytic lesion in the proximal right femur with cortical breakage measuring approximately 3.3 x 4.2 cm in greatest axial dimension. IMPRESSION: 1. Progression of pulmonary and pleural malignancy in the visualized right lung base as well as progression of mediastinal adenopathy. 2. Increase in the size of right adrenal nodule as well as increase in the size of the left gluteal mass and lesion in the proximal right femur. 3. Right inguinal hernia containing several loops of small bowel. No bowel obstruction. 4. Enlarged prostate gland with findings of chronic bladder outlet obstruction. 5.  Aortic Atherosclerosis (ICD10-I70.0). Electronically Signed   By: Angus Bark M.D.   On: 05/03/2023 16:58   CT Angio Chest PE W and/or Wo Contrast Result Date: 05/03/2023 CLINICAL DATA:  Pulmonary embolism suspected EXAM: CT ANGIOGRAPHY CHEST WITH CONTRAST TECHNIQUE: Multidetector CT imaging of the chest was performed using the standard protocol during bolus administration of intravenous contrast. Multiplanar CT image reconstructions and MIPs were obtained to evaluate the vascular anatomy. RADIATION DOSE REDUCTION: This exam was performed according to the  departmental dose-optimization program which includes automated exposure control, adjustment of the mA and/or kV according to patient size and/or use of iterative reconstruction technique. CONTRAST:  OMNIPAQUE  IOHEXOL  350 MG/ML SOLN COMPARISON:  March 21, 2023 FINDINGS: Cardiovascular: Satisfactory opacification of the pulmonary arteries to the segmental level. No evidence of pulmonary embolism. Normal heart size. No pericardial effusion. Coronary artery calcifications. Mediastinum/Nodes: Aortic pulmonic window adenopathy measuring 2 by 1.1 cm, and 1.7 x 2 cm. Lungs/Pleura: Comparison with prior examination there is interval increase in the size of the previously described right upper lobe mass inseparable from the adjacent mediastinum measuring 6.6 x 6.4 cm AP and transverse diameter several fundal right hilum mediastinum. With significant elevation of the right hemidiaphragm and volume loss of the right lower lobe and right middle lobe. Small right pleural reaction without significant right pleural effusion. Significant pleural thickening has worsened since prior examination. The left lung demonstrates a chronic interstitial lung disease and centrilobular emphysema. Upper Abdomen: An ill-defined bilobed mass in the right lateral chest wall intercostal space along the anterior and mid axillary lines. This mass measures at least 6.2 x 3 cm in maximum AP and transverse diameter and is producing some degree of compression and in indentation of the right lobe of the liver. It does appear larger than on prior examination. May correlate with  metastatic disease. Musculoskeletal: No obvious fractures.  No other lytic bone lesions. Review of the MIP images confirms the above findings. IMPRESSION: Above described findings indicate worsening right upper lobe mass. Which is highly suggestive of a malignant neoplastic process such as bronchogenic carcinoma. Inseparable from the adjacent mediastinum and hilum. With  postobstructive pneumonia pneumonitis and bronchiectasis and significant pleural thickening. Interval increase in size of an mass in the right lateral costophrenic sulcus which extends in the subhepatic perihepatic region as described producing indentation and deformity of the right lobe of the liver which suggests metastatic disease probably within the pleural reflection. There is also some nodularity with at the level of the right anterior cardiophrenic angle measuring about 2.5 x 3.6 cm which is similar to prior examination and may correlate with diaphragmatic cardiophrenic adenopathy. No evidence of pulmonary embolism. Electronically Signed   By: Fredrich Jefferson M.D.   On: 05/03/2023 16:50   CT HEAD WO CONTRAST ( ) Result Date: 05/03/2023 CLINICAL DATA:  Trauma EXAM: CT HEAD WITHOUT CONTRAST TECHNIQUE: Contiguous axial images were obtained from the base of the skull through the vertex without intravenous contrast. RADIATION DOSE REDUCTION: This exam was performed according to the departmental dose-optimization program which includes automated exposure control, adjustment of the mA and/or kV according to patient size and/or use of iterative reconstruction technique. COMPARISON:  None Available. FINDINGS: Brain: No evidence of acute infarction, hemorrhage, hydrocephalus, extra-axial collection or mass lesion/mass effect. There is mild diffuse atrophy. Vascular: Atherosclerotic calcifications are present within the cavernous internal carotid arteries. Skull: Normal. Negative for fracture or focal lesion. Sinuses/Orbits: No acute finding. Other: None. IMPRESSION: 1. No acute intracranial process. 2. Mild diffuse atrophy. Electronically Signed   By: Tyron Gallon M.D.   On: 05/03/2023 16:47   DG Chest Portable 1 View Result Date: 05/03/2023 CLINICAL DATA:  Weakness, lung cancer. EXAM: PORTABLE CHEST 1 VIEW COMPARISON:  May 05, 2012.  April 19, 2023. FINDINGS: Stable cardiomegaly. Elevated right hemidiaphragm  is noted. Right upper lobe mass is noted consistent with malignancy noted on PET scan. Diffuse pleural thickening is noted with minimal right basilar subsegmental atelectasis or scarring. Minimal left basilar subsegmental atelectasis is noted. Status post surgical posterior fusion of cervical and upper thoracic spine. IMPRESSION: Right upper lobe mass is noted consistent with malignancy as noted on prior PET scan. Diffuse right-sided pleural thickening is noted with minimal right basilar subsegmental atelectasis or scarring. Minimal left basilar subsegmental atelectasis is noted. Electronically Signed   By: Rosalene Colon M.D.   On: 05/03/2023 15:14   NM PET Image Initial (PI) Skull Base To Thigh Result Date: 04/19/2023 CLINICAL DATA:  Initial treatment strategy for right lung mass. History of colon cancer. Additional findings concerning for metastatic disease on CT. EXAM: NUCLEAR MEDICINE PET SKULL BASE TO THIGH TECHNIQUE: 9.28 mCi F-18 FDG was injected intravenously. Full-ring PET imaging was performed from the skull base to thigh after the radiotracer. CT data was obtained and used for attenuation correction and anatomic localization. Fasting blood glucose: 119 mg/dl COMPARISON:  Chest CT 16/10/9602. Abdominopelvic CT 04/01/2023. PET-CT 01/15/2013. FINDINGS: Mediastinal blood pool activity: SUV max 2.4 NECK: No hypermetabolic cervical lymph nodes are identified. No suspicious activity identified within the pharyngeal mucosal space. Asymmetric muscular activity in the left neck, within physiologic limits. Incidental CT findings: Bilateral carotid atherosclerosis. CHEST: There are hypermetabolic mediastinal lymph nodes, including AP window lymph nodes measuring up to 8 mm on image 44/6 (SUV max 11.8) and a 1.5 cm subcarinal node on  image 51/6 (SUV max 11.7). As seen on recent CTs, there is irregular pleural thickening on the right with associated hypermetabolic activity (SUV max 7.6 medially and 14.8 laterally  near the costophrenic angle), consistent with pleural metastatic disease. Resulting partial right lung collapse. Ill-defined right upper lobe mass is partially obscured by adjacent parenchymal opacity, but demonstrates focal hypermetabolic activity (SUV max 19.1). No hypermetabolic pulmonary activity or highly suspicious nodularity in the left lung. 8 mm left lower lobe nodule on image 49/6 is unchanged from recent CT, without hypermetabolic activity. Incidental CT findings: Underlying mild to moderate centrilobular and paraseptal emphysema. Diffuse atherosclerosis of the aorta, great vessels and coronary arteries. ABDOMEN/PELVIS: Hypermetabolic right adrenal mass measuring 1.8 x 1.4 cm on image 79/6 (SUV max 11.0). There is no hypermetabolic activity within the liver, pancreas, spleen or left adrenal gland. There is no hypermetabolic nodal activity in the abdomen or pelvis. Bowel activity within physiologic limits. No omental or peritoneal hypermetabolic activity identified. There is prostate gland is moderately enlarged with nonspecific focal hypermetabolic activity peripherally on the left (SUV max 8.1). Incidental CT findings: Unchanged cyst posteriorly in the left hepatic lobe (image 72/6) without hypermetabolic activity. There are cystic renal lesions bilaterally without hypermetabolic activity for which no specific follow-up imaging is recommended. Unchanged right inguinal hernia containing small bowel. There is moderate stool throughout the colon status post sigmoid anastomosis. Diffuse aortic and branch vessel atherosclerosis. SKELETON: There are multiple hypermetabolic osseous metastases. A large expansile lesion involving the left iliac wing and adjacent iliacus and gluteus musculature demonstrates peripheral hypermetabolic activity (SUV max 10.1) and central necrosis. Lytic lesion involving the anterior intertrochanteric region of the right femur demonstrates an SUV max of 13.1. There are other lesions  within the right iliac bone, left sacral ala and midthoracic spine. Incidental CT findings: Multilevel spondylosis. Previous multilevel cervicothoracic fusion. Advanced glenohumeral arthropathy, right greater than left. IMPRESSION: 1. Widespread metastatic disease as described with involvement of the right lung, right pleural space, mediastinal lymph nodes, right adrenal gland and multiple bones. 2. Hypermetabolic right upper lobe mass could reflect primary bronchogenic carcinoma or metastatic disease from the patient's colon cancer. 3. Nonspecific focal hypermetabolic activity peripherally in the left prostate gland. Correlate with PSA levels. 4. Aortic Atherosclerosis (ICD10-I70.0) and Emphysema (ICD10-J43.9). Electronically Signed   By: Elmon Hagedorn M.D.   On: 04/19/2023 11:20   DG Abd 1 View Result Date: 04/07/2023 CLINICAL DATA:  Vomiting, small-bowel obstruction EXAM: ABDOMEN - 1 VIEW COMPARISON:  CT 04/01/2023 FINDINGS: The stomach remains mildly dilated. Multiple gas-filled mid abdominal bowel loops without overt dilatation. Right perihilar and basilar pulmonary opacities as before. Multilevel thoracolumbar spondylitic change. Aortoiliac calcified plaque. IMPRESSION: Persistent  gastric dilatation. Electronically Signed   By: Nicoletta Barrier M.D.   On: 04/07/2023 10:38    Microbiology: Results for orders placed or performed during the hospital encounter of 05/03/23  Blood Culture (routine x 2)     Status: None (Preliminary result)   Collection Time: 05/03/23  3:23 PM   Specimen: BLOOD  Result Value Ref Range Status   Specimen Description BLOOD LEFT ANTECUBITAL  Final   Special Requests   Final    BOTTLES DRAWN AEROBIC AND ANAEROBIC Blood Culture results may not be optimal due to an inadequate volume of blood received in culture bottles   Culture   Final    NO GROWTH 2 DAYS Performed at [facility], [address] [place], [place] [address]    Report Status PENDING  Incomplete  Resp panel by RT-PCR (RSV, Flu A&B, Covid) Anterior Nasal Swab     Status: None   Collection Time: 05/03/23  3:40 PM   Specimen: Anterior Nasal Swab  Result Value Ref Range Status   SARS Coronavirus 2 by RT PCR NEGATIVE NEGATIVE Final    Comment: (NOTE) SARS-CoV-2 target nucleic acids are NOT DETECTED.  The SARS-CoV-2 RNA is generally detectable in upper respiratory specimens during the acute phase of infection. The lowest concentration of SARS-CoV-2 viral copies this assay can detect is 138 copies/mL. A negative result does not preclude SARS-Cov-2 infection and should not be used as the sole basis for treatment or other patient management decisions. A negative result may occur with  improper specimen collection/handling, submission of specimen other than nasopharyngeal swab, presence of viral mutation(s) within the areas targeted by this assay, and inadequate number of viral copies(<138 copies/mL). A negative result must be combined with clinical observations, patient history, and epidemiological information. The expected result is Negative.  Fact Sheet for Patients:  BloggerCourse.com  Fact Sheet for Healthcare Providers:  SeriousBroker.it  This test is no t yet approved or cleared by the [place]  FDA and  has been authorized for detection and/or diagnosis of SARS-CoV-2 by FDA under an Emergency Use Authorization (EUA). This EUA will remain  in effect (meaning this test can be used) for the duration of the COVID-19 declaration under Section 564(b)(1) of the Act, 21 U.S.C.section 360bbb-3(b)(1), unless the authorization is terminated  or revoked sooner.       Influenza A by PCR NEGATIVE NEGATIVE Final   Influenza B by PCR NEGATIVE NEGATIVE Final    Comment: (NOTE) The Xpert Xpress SARS-CoV-2/FLU/RSV plus assay is intended as an aid in the diagnosis of influenza from Nasopharyngeal swab specimens and should not be used  as a sole basis for treatment. Nasal washings and aspirates are unacceptable for Xpert Xpress SARS-CoV-2/FLU/RSV testing.  Fact Sheet for Patients: BloggerCourse.com  Fact Sheet for Healthcare Providers: SeriousBroker.it  This test is not yet approved or cleared by the [place]  FDA and has been authorized for detection and/or diagnosis of SARS-CoV-2 by FDA under an Emergency Use Authorization (EUA). This EUA will remain in effect (meaning this test can be used) for the duration of the COVID-19 declaration under Section 564(b)(1) of the Act, 21 U.S.C. section 360bbb-3(b)(1), unless the authorization is terminated or revoked.     Resp Syncytial Virus by PCR NEGATIVE NEGATIVE Final    Comment: (NOTE) Fact Sheet for Patients: BloggerCourse.com  Fact Sheet for Healthcare Providers: SeriousBroker.it  This test is not yet approved or cleared by the [place]  FDA and has been authorized for detection and/or diagnosis of SARS-CoV-2 by FDA under an Emergency Use Authorization (EUA). This EUA will remain in effect (meaning this test can be used) for the duration of the COVID-19 declaration under Section 564(b)(1) of the Act, 21 U.S.C. section 360bbb-3(b)(1), unless the authorization is terminated or revoked.  Performed at [facility], [address] [place], [place] [address]   Blood Culture (routine x 2)     Status: None (Preliminary result)   Collection Time: 05/03/23  3:40 PM   Specimen: BLOOD  Result Value Ref Range Status   Specimen Description BLOOD BLOOD RIGHT FOREARM  Final   Special Requests   Final    BOTTLES DRAWN AEROBIC AND ANAEROBIC Blood Culture results may not be optimal due to an inadequate volume of blood received in culture bottles   Culture  Setup Time  Final    ANAEROBIC BOTTLE ONLY GRAM NEGATIVE RODS Organism ID to follow CRITICAL RESULT  CALLED TO, READ BACK BY AND VERIFIED WITH: Marguerite Shiley [facility] 1204 05/05/23 AT Performed at [facility], [address] [place], [place] [address]    Culture GRAM NEGATIVE RODS  Final   Report Status PENDING  Incomplete  Urine Culture     Status: Abnormal   Collection Time: 05/03/23  3:40 PM   Specimen: Urine, Random  Result Value Ref Range Status   Specimen Description   Final    URINE, RANDOM Performed at [facility], [address] [place], [place] [address]    Special Requests   Final    NONE Reflexed from [id number] [phone] Performed at [facility], [address] [place], [place] [address]    Culture 20,000 COLONIES/mL PROTEUS MIRABILIS (A)  Final   Report Status 05/05/2023 FINAL  Final   Organism ID, Bacteria PROTEUS MIRABILIS (A)  Final      Susceptibility   Proteus mirabilis - MIC*    AMPICILLIN <=2 SENSITIVE Sensitive     CEFAZOLIN <=4 SENSITIVE Sensitive     CEFEPIME  <=0.12 SENSITIVE Sensitive     CEFTRIAXONE  <=0.25 SENSITIVE Sensitive     CIPROFLOXACIN <=0.25 SENSITIVE Sensitive     GENTAMICIN <=1 SENSITIVE Sensitive     IMIPENEM 4 SENSITIVE Sensitive     NITROFURANTOIN 128 RESISTANT Resistant     TRIMETH/SULFA <=20 SENSITIVE Sensitive     AMPICILLIN/SULBACTAM <=2 SENSITIVE Sensitive     PIP/TAZO <=4 SENSITIVE Sensitive ug/mL    * 20,000 COLONIES/mL PROTEUS MIRABILIS  Blood Culture ID Panel (Reflexed)     Status: Abnormal   Collection Time: 05/03/23  3:40 PM  Result Value Ref Range Status   Enterococcus faecalis NOT DETECTED NOT DETECTED Final   Enterococcus Faecium NOT DETECTED NOT DETECTED Final   Listeria monocytogenes NOT DETECTED NOT DETECTED Final   Staphylococcus species NOT DETECTED NOT DETECTED Final   Staphylococcus aureus (BCID) NOT DETECTED NOT DETECTED Final   Staphylococcus epidermidis NOT DETECTED NOT DETECTED Final   Staphylococcus lugdunensis NOT DETECTED NOT DETECTED Final   Streptococcus species NOT DETECTED NOT  DETECTED Final   Streptococcus agalactiae NOT DETECTED NOT DETECTED Final   Streptococcus pneumoniae NOT DETECTED NOT DETECTED Final   Streptococcus pyogenes NOT DETECTED NOT DETECTED Final   A.calcoaceticus-baumannii NOT DETECTED NOT DETECTED Final   Bacteroides fragilis NOT DETECTED NOT DETECTED Final   Enterobacterales DETECTED (A) NOT DETECTED Final    Comment: Enterobacterales represent a large order of gram negative bacteria, not a single organism. CRITICAL RESULT CALLED TO, READ BACK BY AND VERIFIED WITH: Marguerite Shiley PHARMD 1204 05/05/23 AT    Enterobacter cloacae complex NOT DETECTED NOT DETECTED Final   Escherichia coli NOT DETECTED NOT DETECTED Final   Klebsiella aerogenes NOT DETECTED NOT DETECTED Final   Klebsiella oxytoca NOT DETECTED NOT DETECTED Final   Klebsiella pneumoniae NOT DETECTED NOT DETECTED Final   Proteus species DETECTED (A) NOT DETECTED Final    Comment: CRITICAL RESULT CALLED TO, READ BACK BY AND VERIFIED WITH: Marguerite Shiley PHARMD 1204 05/05/23 AT    Salmonella species NOT DETECTED NOT DETECTED Final   Serratia marcescens NOT DETECTED NOT DETECTED Final   Haemophilus influenzae NOT DETECTED NOT DETECTED Final   Neisseria meningitidis NOT DETECTED NOT DETECTED Final   Pseudomonas aeruginosa NOT DETECTED NOT DETECTED Final   Stenotrophomonas maltophilia NOT DETECTED NOT DETECTED Final   Candida albicans NOT DETECTED NOT DETECTED Final  Candida auris NOT DETECTED NOT DETECTED Final   Candida glabrata NOT DETECTED NOT DETECTED Final   Candida krusei NOT DETECTED NOT DETECTED Final   Candida parapsilosis NOT DETECTED NOT DETECTED Final   Candida tropicalis NOT DETECTED NOT DETECTED Final   Cryptococcus neoformans/gattii NOT DETECTED NOT DETECTED Final   CTX-M ESBL NOT DETECTED NOT DETECTED Final   Carbapenem resistance IMP NOT DETECTED NOT DETECTED Final   Carbapenem resistance KPC NOT DETECTED NOT DETECTED Final   Carbapenem resistance NDM NOT  DETECTED NOT DETECTED Final   Carbapenem resist OXA 48 LIKE NOT DETECTED NOT DETECTED Final   Carbapenem resistance VIM NOT DETECTED NOT DETECTED Final    Comment: Performed at [facility], [address] [place], [place] [address]    Labs: CBC: Recent Labs  Lab 05/03/23 1413 05/04/23 0514  WBC 19.6* 19.7*  NEUTROABS 17.5*  --   HGB 12.4* 11.0*  HCT 38.9* 34.6*  MCV 81.4 82.2  PLT 205 180   Basic Metabolic Panel: Recent Labs  Lab 05/03/23 1413 05/04/23 0514 05/05/23 0503  NA 143 146* 144  K 4.2 3.8 4.0  CL 110 113* 112*  CO2 23 22 21*  GLUCOSE 145* 106* 134*  BUN 56* 52* 46*  CREATININE 1.33* 1.23 1.15  CALCIUM 8.8* 8.5* 9.0   Liver Function Tests: Recent Labs  Lab 05/03/23 1413  AST 25  ALT 37  ALKPHOS 132*  BILITOT 1.0  PROT 6.8  ALBUMIN 2.1*    Discharge time spent: greater than 30 minutes.  Signed: Melvinia Stager, MD [facility] 05/05/2023

## 2023-05-05 NOTE — Plan of Care (Signed)

## 2023-05-07 LAB — CULTURE, BLOOD (ROUTINE X 2)

## 2023-05-08 ENCOUNTER — Encounter: Payer: Self-pay | Admitting: *Deleted

## 2023-05-08 LAB — CULTURE, BLOOD (ROUTINE X 2): Culture: NO GROWTH

## 2023-05-10 ENCOUNTER — Ambulatory Visit

## 2023-05-11 DEATH — deceased

## 2023-05-19 ENCOUNTER — Ambulatory Visit: Admitting: Oncology
